# Patient Record
Sex: Female | Born: 1975 | Race: White | Hispanic: No | Marital: Married | Smoking: Current every day smoker
Health system: Southern US, Community
[De-identification: ages and names within clinical notes are randomized; demographics above are authoritative.]

## PROBLEM LIST (undated history)

## (undated) DIAGNOSIS — F419 Anxiety disorder, unspecified: Secondary | ICD-10-CM

## (undated) DIAGNOSIS — K219 Gastro-esophageal reflux disease without esophagitis: Secondary | ICD-10-CM

## (undated) DIAGNOSIS — F32A Depression, unspecified: Secondary | ICD-10-CM

## (undated) DIAGNOSIS — M47812 Spondylosis without myelopathy or radiculopathy, cervical region: Secondary | ICD-10-CM

## (undated) DIAGNOSIS — J189 Pneumonia, unspecified organism: Secondary | ICD-10-CM

## (undated) DIAGNOSIS — D259 Leiomyoma of uterus, unspecified: Secondary | ICD-10-CM

## (undated) HISTORY — PX: OVARIAN CYST REMOVAL: SHX89

## (undated) HISTORY — PX: FRACTURE SURGERY: SHX138

## (undated) HISTORY — PX: BILATERAL SALPINGECTOMY: SHX5743

## (undated) HISTORY — PX: TUBAL LIGATION: SHX77

---

## 2013-07-05 HISTORY — PX: FRACTURE SURGERY: SHX138

## 2013-07-05 HISTORY — PX: OOPHORECTOMY: SHX86

## 2015-09-17 ENCOUNTER — Emergency Department
Admission: EM | Admit: 2015-09-17 | Discharge: 2015-09-17 | Disposition: A | Discharge status: Home / Self Care | Payer: Self-pay | Attending: Emergency Medicine | Admitting: Emergency Medicine

## 2015-09-17 ENCOUNTER — Encounter: Payer: Self-pay | Admitting: Medical Oncology

## 2015-09-17 ENCOUNTER — Emergency Department: Payer: Self-pay

## 2015-09-17 DIAGNOSIS — F17200 Nicotine dependence, unspecified, uncomplicated: Secondary | ICD-10-CM | POA: Insufficient documentation

## 2015-09-17 DIAGNOSIS — M4692 Unspecified inflammatory spondylopathy, cervical region: Secondary | ICD-10-CM | POA: Insufficient documentation

## 2015-09-17 DIAGNOSIS — M62838 Other muscle spasm: Secondary | ICD-10-CM

## 2015-09-17 DIAGNOSIS — M62830 Muscle spasm of back: Secondary | ICD-10-CM | POA: Insufficient documentation

## 2015-09-17 DIAGNOSIS — M436 Torticollis: Secondary | ICD-10-CM | POA: Insufficient documentation

## 2015-09-17 DIAGNOSIS — M47812 Spondylosis without myelopathy or radiculopathy, cervical region: Secondary | ICD-10-CM

## 2015-09-17 MED ORDER — KETOROLAC TROMETHAMINE 30 MG/ML IJ SOLN
30.0000 mg | Freq: Once | INTRAMUSCULAR | Status: AC
  Administered 2015-09-17: 30 mg via INTRAMUSCULAR
  Filled 2015-09-17: qty 1

## 2015-09-17 MED ORDER — NAPROXEN 500 MG PO TABS
500.0000 mg | ORAL_TABLET | Freq: Two times a day (BID) | ORAL | Status: DC
Start: 2015-09-17 — End: 2016-01-19

## 2015-09-17 MED ORDER — CYCLOBENZAPRINE HCL 10 MG PO TABS: 10.0000 mg | ORAL_TABLET | Freq: Three times a day (TID) | ORAL | Status: DC | PRN

## 2015-09-17 NOTE — ED Provider Notes (Signed)
[facility] Emergency Department Provider Note  ____________________________________________  Time seen: Approximately 10:11 AM  I have reviewed the triage vital signs and the nursing notes.   HISTORY  Chief Complaint Torticollis    HPI Barbara Arellano is a 40 y.o. female , NAD, presents to the emergency department with 2 month history of bilateral neck pain. States she woke with the pain 2 months ago and was seen at [facility]. States that imaging or other evaluation was completed at that time. Was given medication at that time which the patienthas not helped. She has recently moved to the [place] area and presents for reevaluation. No she is uninsured and does not have a primary care provider at this time. States neck pain causes headaches on a regular basis. She denies any injury, falls, traumas. Has not had any changes in vision, chest pain, shortness of breath. No numbness, weakness, tingling. Has taken some over-the-counter medications with no alleviation of her symptoms. States she cannot fully rotate her head without significant pain. It does not radiate into the shoulders or arms.   History reviewed. No pertinent past medical history.  There are no active problems to display for this patient.   Past Surgical History  Procedure Laterality Date  . Oophorectomy      Current Outpatient Rx  Name  Route  Sig  Dispense  Refill  . cyclobenzaprine (FLEXERIL) 10 MG tablet   Oral   Take 1 tablet (10 mg total) by mouth 3 (three) times daily as needed for muscle spasms.   21 tablet   0   . naproxen (NAPROSYN) 500 MG tablet   Oral   Take 1 tablet (500 mg total) by mouth 2 (two) times daily with a meal.   14 tablet   0     Allergies Review of patient's allergies indicates no known allergies.  No family history on file.  Social History Social History  Substance Use Topics  . Smoking status: Current Every Day Smoker  . Smokeless  tobacco: None  . Alcohol Use: None     Review of Systems  Constitutional: No fever/chills Eyes: No visual changes.  ENT: No sore throat, ear pain. Cardiovascular: No chest pain. Respiratory:  No shortness of breath. No wheezing.  Gastrointestinal: No abdominal pain.  No nausea, vomiting.   Musculoskeletal: Positive neck pain and tightness. Negative for back, shoulder, arm pain. No radiation of pain. Skin: Negative for rash. Neurological: Positive for headaches, but no focal weakness or numbness. No tingling. 10-point ROS otherwise negative.  ____________________________________________   PHYSICAL EXAM:  VITAL SIGNS: ED Triage Vitals  Enc Vitals Group     BP 09/17/15 0943 133/91 mmHg     Pulse Rate 09/17/15 0943 99     Resp 09/17/15 0943 18     Temp 09/17/15 0943 98.2 F (36.8 C)     Temp Source 09/17/15 0943 Oral     SpO2 09/17/15 0943 97 %     Weight 09/17/15 0943 138 lb (62.596 kg)     Height 09/17/15 0943 5\' 4"  (1.626 m)     Head Cir --      Peak Flow --      Pain Score 09/17/15 0944 10     Pain Loc --      Pain Edu? --      Excl. in [place]? --     Constitutional: Alert and oriented. Well appearing and in no acute distress. Eyes: Conjunctivae are normal. PERRL. EOMI without  pain.  Head: Atraumatic. ENT:      Ears:       Nose: No congestion/rhinnorhea.      Mouth/Throat: Mucous membranes are moist.  Neck: Lower central cervical spine tenderness to palpation. Some paraspinal tenderness to palpation with mild muscle spasm. Neck is supple. Range of motion of cervical spine and decreased due to muscle spasm and pain. Hematological/Lymphatic/Immunilogical: No cervical lymphadenopathy. Cardiovascular: Normal rate, regular rhythm. Normal S1 and S2.  Good peripheral circulation. Respiratory: Normal respiratory effort without tachypnea or retractions. Lungs CTAB. Musculoskeletal: Neck posture is stiff. No pain to palpation about bilateral shoulders and upper extremities. No  joint effusions. Neurologic:  Normal speech and language. No gross focal neurologic deficits are appreciated.  Skin:  Skin is warm, dry and intact. No rash noted. Psychiatric: Mood and affect are normal. Speech and behavior are normal. Patient exhibits appropriate insight and judgement.   ____________________________________________   LABS  None  ____________________________________________  EKG  None ____________________________________________  RADIOLOGY I have personally viewed and evaluated these images (plain radiographs) as part of my medical decision making, as well as reviewing the written report by the radiologist.  Dg Cervical Spine Complete  09/17/2015  CLINICAL DATA:  Neck pain and limited range of motion for 2 months. No known injury. Initial encounter. EXAM: CERVICAL SPINE - COMPLETE 4+ VIEW COMPARISON:  None. FINDINGS: Vertebral body height and alignment are maintained. Mild loss of disc space height and endplate spurring are seen at C5-6 and C6-7. Lower cervical facet arthropathy is noted. Prevertebral soft tissues appear normal. Lung apices are clear. IMPRESSION: No acute abnormality. 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 Electronically Signed   By: Inge Rise M.D.   On: 09/17/2015 11:14    ____________________________________________    PROCEDURES  Procedure(s) performed: None    Medications  ketorolac (TORADOL) 30 MG/ML injection 30 mg (30 mg Intramuscular Given 09/17/15 1136)    Patient notes improvement of pain after administration of Toradol. ____________________________________________   INITIAL IMPRESSION / ASSESSMENT AND PLAN / ED COURSE  Pertinent imaging results that were available during my care of the patient were reviewed by me and considered in my medical decision making (see chart for details).  Patient's diagnosis is consistent with muscle spasms of neck and cervical arthritis. Patient will be discharged home with prescriptions for  naproxen and Flexeril to take as directed. Patient may also take Tylenol as needed for acute pain. Patient is to follow up with Dr. Bess Harvest in orthopedics for recheck in one week if no better. Also advised patient to establish care with [place] [facility] for primary care services. Patient is given ED precautions to return to the ED for any worsening or new symptoms.    ____________________________________________  FINAL CLINICAL IMPRESSION(S) / ED DIAGNOSES  Final diagnoses:  Cervical arthritis ([place])  Muscle spasms of neck      NEW MEDICATIONS STARTED DURING THIS VISIT:  Discharge Medication List as of 09/17/2015 12:07 PM    START taking these medications   Details  cyclobenzaprine (FLEXERIL) 10 MG tablet Take 1 tablet (10 mg total) by mouth 3 (three) times daily as needed for muscle spasms., Starting 09/17/2015, Until Discontinued, Print    naproxen (NAPROSYN) 500 MG tablet Take 1 tablet (500 mg total) by mouth 2 (two) times daily with a meal., Starting 09/17/2015, Until Discontinued, Boyd, PA-C 09/17/15 1246  Daymon Larsen, MD 09/17/15 1315

## 2015-09-17 NOTE — ED Notes (Signed)
Pt reports that she has been having neck pain since 07/27/15. Pt reports she was seen at [facility] recently and placed on valium which did not help. Pt denies any initial injury.

## 2015-09-17 NOTE — Discharge Instructions (Signed)
Arthritis  Arthritis means joint pain. It can also mean joint disease. A joint is a place where bones come together. People who have arthritis may have:  Red joints.  Swollen joints.  Stiff joints.  Warm joints.  A fever.    A feeling of being sick. HOME CARE  Pay attention to any changes in your symptoms. Take these actions to help with your pain and swelling.  Medicines  Take over-the-counter and prescription medicines only as told by your doctor.  Do not take aspirin for pain if your doctor says that you may have gout. Activities  Rest your joint if your doctor tells you to.  Avoid activities that make the pain worse.  Exercise your joint regularly as told by your doctor. Try doing exercises like:  Swimming.  Water aerobics.  Biking.  Walking. Joint Care  If your joint is swollen, keep it raised (elevated) if told by your doctor.  If your joint feels stiff in the morning, try taking a warm shower.  If you have diabetes, do not apply heat without asking your doctor.  If told, apply heat to the joint:  Put a towel between the joint and the hot pack or heating pad.  Leave the heat on the area for 20-30 minutes. If told, apply ice to the joint:  Put ice in a plastic bag.  Place a towel between your skin and the bag.  Leave the ice on for 20 minutes, 2-3 times per day. Keep all follow-up visits as told by your doctor. GET HELP IF:  The pain gets worse.  You have a fever. GET HELP RIGHT AWAY IF:  You have very bad pain in your joint.  You have swelling in your joint.  Your joint is red.  Many joints become painful and swollen.  You have very bad back pain.  Your leg is very weak.  You cannot control your pee (urine) or poop (stool). This information is not intended to replace advice given to you by your health care provider. Make sure you discuss any questions you have with your health care provider.  Document Released: 09/15/2009 Document Revised: 03/12/2015 Document  Reviewed: 09/16/2014  Elsevier Interactive Patient Education 2016 [place] therapy can help ease sore, stiff, injured, and tight muscles and joints. Heat relaxes your muscles, which may help ease your pain.  RISKS AND COMPLICATIONS If you have any of the following conditions, do not use heat therapy unless your health care provider has approved:  Poor circulation.  Healing wounds or scarred skin in the area being treated.  Diabetes, heart disease, or high blood pressure.  Not being able to feel (numbness) the area being treated.  Unusual swelling of the area being treated.  Active infections.  Blood clots.  Cancer.  Inability to communicate pain. This may include young children and people who have problems with their brain function (dementia).  Pregnancy. Heat therapy should only be used on old, pre-existing, or long-lasting (chronic) injuries. Do not use heat therapy on new injuries unless directed by your health care provider. HOW TO USE HEAT THERAPY There are several different kinds of heat therapy, including:  Moist heat pack.  Warm water bath.  Hot water bottle.  Electric heating pad.  Heated gel pack.  Heated wrap.  Electric heating pad. Use the heat therapy method suggested by your health care provider. Follow your health care provider's instructions on when and how to use heat therapy. GENERAL HEAT THERAPY RECOMMENDATIONS  Do not sleep while using heat therapy. Only use heat therapy while you are awake.  Your skin may turn pink while using heat therapy. Do not use heat therapy if your skin turns red.  Do not use heat therapy if you have new pain.  High heat or long exposure to heat can cause burns. Be careful when using heat therapy to avoid burning your skin.  Do not use heat therapy on areas of your skin that are already irritated, such as with a rash or sunburn. SEEK MEDICAL CARE IF:  You have blisters, redness, swelling, or  numbness.  You have new pain.  Your pain is worse. MAKE SURE YOU:  Understand these instructions.  Will watch your condition.  Will get help right away if you are not doing well or get worse.   This information is not intended to replace advice given to you by your health care provider. Make sure you discuss any questions you have with your health care provider.   Document Released: 09/13/2011 Document Revised: 07/12/2014 Document Reviewed: 08/14/2013 Elsevier Interactive Patient Education 2016 Elsevier Inc[employer]   Muscle Cramps and Spasms    Muscle cramps and spasms occur when a muscle or muscles tighten and you have no control over this tightening (involuntary muscle contraction). They are a common problem and can develop in any muscle. The most common place is in the calf muscles of the leg. Both muscle cramps and muscle spasms are involuntary muscle contractions, but they also have differences:  Muscle cramps are sporadic and painful. They may last a few seconds to a quarter of an hour. Muscle cramps are often more forceful and last longer than muscle spasms.  Muscle spasms may or may not be painful. They may also last just a few seconds or much longer. CAUSES  It is uncommon for cramps or spasms to be due to a serious underlying problem. In many cases, the cause of cramps or spasms is unknown. Some common causes are:  Overexertion.  Overuse from repetitive motions (doing the same thing over and over).  Remaining in a certain position for a long period of time.  Improper preparation, form, or technique while performing a sport or activity.  Dehydration.  Injury.  Side effects of some medicines.  Abnormally low levels of the salts and ions in your blood (electrolytes), especially potassium and calcium. This could happen if you are taking water pills (diuretics) or you are pregnant.  Some underlying medical problems can make it more likely to develop cramps or spasms. These include,  but are not limited to:  Diabetes.  Parkinson disease.  Hormone disorders, such as thyroid problems.  Alcohol abuse.  Diseases specific to muscles, joints, and bones.  Blood vessel disease where not enough blood is getting to the muscles.  HOME CARE INSTRUCTIONS  Stay well hydrated. Drink enough water and fluids to keep your urine clear or pale yellow.  It may be helpful to massage, stretch, and relax the affected muscle.  For tight or tense muscles, use a warm towel, heating pad, or hot shower water directed to the affected area.  If you are sore or have pain after a cramp or spasm, applying ice to the affected area may relieve discomfort.  Put ice in a plastic bag.  Place a towel between your skin and the bag.  Leave the ice on for 15-20 minutes, 03-04 times a day. Medicines used to treat a known cause of cramps or spasms may help reduce their  frequency or severity. Only take over-the-counter or prescription medicines as directed by your caregiver. SEEK MEDICAL CARE IF:  Your cramps or spasms get more severe, more frequent, or do not improve over time.  MAKE SURE YOU:  Understand these instructions.  Will watch your condition.  Will get help right away if you are not doing well or get worse. This information is not intended to replace advice given to you by your health care provider. Make sure you discuss any questions you have with your health care provider.  Document Released: 12/11/2001 Document Revised: 10/16/2012 Document Reviewed: 06/07/2012  Elsevier Interactive Patient Education [employer].

## 2015-11-24 ENCOUNTER — Emergency Department
Admission: EM | Admit: 2015-11-24 | Discharge: 2015-11-24 | Disposition: A | Discharge status: Home / Self Care | Payer: Self-pay | Attending: Emergency Medicine | Admitting: Emergency Medicine

## 2015-11-24 ENCOUNTER — Encounter: Payer: Self-pay | Admitting: Emergency Medicine

## 2015-11-24 ENCOUNTER — Emergency Department: Payer: Self-pay

## 2015-11-24 DIAGNOSIS — M778 Other enthesopathies, not elsewhere classified: Secondary | ICD-10-CM | POA: Insufficient documentation

## 2015-11-24 DIAGNOSIS — Y9389 Activity, other specified: Secondary | ICD-10-CM | POA: Insufficient documentation

## 2015-11-24 DIAGNOSIS — F17200 Nicotine dependence, unspecified, uncomplicated: Secondary | ICD-10-CM | POA: Insufficient documentation

## 2015-11-24 DIAGNOSIS — Y929 Unspecified place or not applicable: Secondary | ICD-10-CM | POA: Insufficient documentation

## 2015-11-24 DIAGNOSIS — Y999 Unspecified external cause status: Secondary | ICD-10-CM | POA: Insufficient documentation

## 2015-11-24 DIAGNOSIS — X509XXA Other and unspecified overexertion or strenuous movements or postures, initial encounter: Secondary | ICD-10-CM | POA: Insufficient documentation

## 2015-11-24 MED ORDER — TRAMADOL HCL 50 MG PO TABS: 50.0000 mg | ORAL_TABLET | Freq: Four times a day (QID) | ORAL | Status: DC | PRN

## 2015-11-24 MED ORDER — CYCLOBENZAPRINE HCL 10 MG PO TABS: 10.0000 mg | ORAL_TABLET | Freq: Three times a day (TID) | ORAL | Status: DC | PRN

## 2015-11-24 MED ORDER — MELOXICAM 15 MG PO TABS: 15.0000 mg | ORAL_TABLET | Freq: Every day | ORAL | Status: DC

## 2015-11-24 NOTE — ED Provider Notes (Signed)
[facility] Emergency Department Provider Note ____________________________________________  Time seen: Approximately 12:24 PM  I have reviewed the triage vital signs and the nursing notes.   HISTORY  Chief Complaint Elbow Pain    HPI Barbara Arellano is a 40 y.o. female who presents to the emergency department for evaluation of right elbow pain. She states that on Friday, her neighbor's tree fell and knocked her fence down. She states that she had to go out and try and repair it so her dogs would not get out. Denies specific injury, but states that the pain started soon after this incident. Pain worsens with full extension and is tender to touch. Also, she has a job that requires repetitive motion. She has not taken anything for pain.  History reviewed. No pertinent past medical history.  There are no active problems to display for this patient.   Past Surgical History  Procedure Laterality Date  . Oophorectomy      Current Outpatient Rx  Name  Route  Sig  Dispense  Refill  . cyclobenzaprine (FLEXERIL) 10 MG tablet   Oral   Take 1 tablet (10 mg total) by mouth 3 (three) times daily as needed for muscle spasms.   30 tablet   0   . meloxicam (MOBIC) 15 MG tablet   Oral   Take 1 tablet (15 mg total) by mouth daily.   30 tablet   2   . naproxen (NAPROSYN) 500 MG tablet   Oral   Take 1 tablet (500 mg total) by mouth 2 (two) times daily with a meal.   14 tablet   0   . traMADol (ULTRAM) 50 MG tablet   Oral   Take 1 tablet (50 mg total) by mouth every 6 (six) hours as needed.   12 tablet   0     Allergies Review of patient's allergies indicates no known allergies.  No family history on file.  Social History Social History  Substance Use Topics  . Smoking status: Current Every Day Smoker  . Smokeless tobacco: None  . Alcohol Use: None    Review of Systems Constitutional: No recent illness. Cardiovascular: Denies chest pain or  palpitations. Respiratory: Denies shortness of breath. Musculoskeletal: Pain in Right elbow Skin: Negative for rash, wound, lesion. Neurological: Negative for focal weakness or numbness.  ____________________________________________   PHYSICAL EXAM:  VITAL SIGNS: ED Triage Vitals  Enc Vitals Group     BP 11/24/15 1202 133/88 mmHg     Pulse Rate 11/24/15 1202 82     Resp 11/24/15 1202 20     Temp 11/24/15 1202 98.7 F (37.1 C)     Temp Source 11/24/15 1202 Oral     SpO2 11/24/15 1202 98 %     Weight 11/24/15 1202 146 lb (66.225 kg)     Height 11/24/15 1202 5\' 4"  (1.626 m)     Head Cir --      Peak Flow --      Pain Score 11/24/15 1205 6     Pain Loc --      Pain Edu? --      Excl. in [place]? --     Constitutional: Alert and oriented. Well appearing and in no acute distress. Eyes: Conjunctivae are normal. EOMI. Head: Atraumatic. Neck: No stridor.  Respiratory: Normal respiratory effort.   Musculoskeletal: Limited extension of the right elbow. 2+ radial pulse. Neurologic:  Normal speech and language. No gross focal neurologic deficits are appreciated. Speech is normal.  No gait instability. Skin:  Skin is warm, dry and intact. Atraumatic. Psychiatric: Mood and affect are normal. Speech and behavior are normal.  ____________________________________________   LABS (all labs ordered are listed, but only abnormal results are displayed)  Labs Reviewed - No data to display ____________________________________________  RADIOLOGY  Negative for acute bony abnormality.  I, Sherrie George, personally viewed and evaluated these images (plain radiographs) as part of my medical decision making, as well as reviewing the written report by the radiologist.  ____________________________________________   PROCEDURES  Procedure(s) performed: None   ____________________________________________   INITIAL IMPRESSION / ASSESSMENT AND PLAN / ED COURSE  Pertinent labs & imaging  results that were available during my care of the patient were reviewed by me and considered in my medical decision making (see chart for details).  Patient will be given prescriptions for meloxicam, tramadol, and Flexeril. She is to follow-up with the primary care provider of her choice for symptoms that are not improving over the next week. She was advised to return to the emergency department for symptoms that change or worsen if she unable schedule an appointment. ____________________________________________   FINAL CLINICAL IMPRESSION(S) / ED DIAGNOSES  Final diagnoses:  Right elbow tendonitis       Victorino Dike, FNP 11/24/15 1540  Eula Listen, MD 11/24/15 1556

## 2015-11-24 NOTE — ED Notes (Signed)
Developed right elbow pain for about 3 days   Unsure of injury   Pain is located in elbow only pain increases with movement  And movement is limited

## 2016-01-19 ENCOUNTER — Emergency Department
Admission: EM | Admit: 2016-01-19 | Discharge: 2016-01-19 | Disposition: A | Discharge status: Home / Self Care | Payer: Self-pay | Attending: Emergency Medicine | Admitting: Emergency Medicine

## 2016-01-19 DIAGNOSIS — K047 Periapical abscess without sinus: Secondary | ICD-10-CM | POA: Insufficient documentation

## 2016-01-19 DIAGNOSIS — L259 Unspecified contact dermatitis, unspecified cause: Secondary | ICD-10-CM | POA: Insufficient documentation

## 2016-01-19 DIAGNOSIS — F17210 Nicotine dependence, cigarettes, uncomplicated: Secondary | ICD-10-CM | POA: Insufficient documentation

## 2016-01-19 DIAGNOSIS — Z791 Long term (current) use of non-steroidal anti-inflammatories (NSAID): Secondary | ICD-10-CM | POA: Insufficient documentation

## 2016-01-19 MED ORDER — LIDOCAINE VISCOUS 2 % MT SOLN: 20.0000 mL | OROMUCOSAL | Status: DC | PRN

## 2016-01-19 MED ORDER — OXYCODONE-ACETAMINOPHEN 5-325 MG PO TABS
1.0000 | ORAL_TABLET | Freq: Once | ORAL | Status: AC
  Administered 2016-01-19: 1 via ORAL
  Filled 2016-01-19: qty 1

## 2016-01-19 MED ORDER — OXYCODONE-ACETAMINOPHEN 5-325 MG PO TABS: 1.0000 | ORAL_TABLET | ORAL | Status: DC | PRN

## 2016-01-19 MED ORDER — AMOXICILLIN 500 MG PO TABS: 500.0000 mg | ORAL_TABLET | Freq: Two times a day (BID) | ORAL | Status: DC

## 2016-01-19 MED ORDER — PREDNISONE 10 MG PO TABS: 50.0000 mg | ORAL_TABLET | Freq: Every day | ORAL | Status: DC

## 2016-01-19 MED ORDER — NAPROXEN 500 MG PO TABS: 500.0000 mg | ORAL_TABLET | Freq: Two times a day (BID) | ORAL | Status: DC

## 2016-01-19 NOTE — Discharge Instructions (Signed)
Dental Abscess A dental abscess is a collection of pus in or around a tooth. CAUSES This condition is caused by a bacterial infection around the root of the tooth that involves the inner part of the tooth (pulp). It may result from:  Severe tooth decay.  Trauma to the tooth that allows bacteria to enter into the pulp, such as a broken or chipped tooth.  Severe gum disease around a tooth. SYMPTOMS Symptoms of this condition include:  Severe pain in and around the infected tooth.  Swelling and redness around the infected tooth, in the mouth, or in the face.  Tenderness.  Pus drainage.  Bad breath.  Bitter taste in the mouth.  Difficulty swallowing.  Difficulty opening the mouth.  Nausea.  Vomiting.  Chills.  Swollen neck glands.  Fever. DIAGNOSIS This condition is diagnosed with examination of the infected tooth. During the exam, your dentist may tap on the infected tooth. Your dentist will also ask about your medical and dental history and may order X-rays. TREATMENT This condition is treated by eliminating the infection. This may be done with:  Antibiotic medicine.  A root canal. This may be performed to save the tooth.  Pulling (extracting) the tooth. This may also involve draining the abscess. This is done if the tooth cannot be saved. HOME CARE INSTRUCTIONS  Take medicines only as directed by your dentist.  If you were prescribed antibiotic medicine, finish all of it even if you start to feel better.  Rinse your mouth (gargle) often with salt water to relieve pain or swelling.  Do not drive or operate heavy machinery while taking pain medicine.  Do not apply heat to the outside of your mouth.  Keep all follow-up visits as directed by your dentist. This is important. SEEK MEDICAL CARE IF:  Your pain is worse and is not helped by medicine. SEEK IMMEDIATE MEDICAL CARE IF:  You have a fever or chills.  Your symptoms suddenly get worse.  You have a  very bad headache.  You have problems breathing or swallowing.  You have trouble opening your mouth.  You have swelling in your neck or around your eye.   This information is not intended to replace advice given to you by your health care provider. Make sure you discuss any questions you have with your health care provider.   Document Released: 06/21/2005 Document Revised: 11/05/2014 Document Reviewed: 06/18/2014 Elsevier Interactive Patient Education 2016 [place] Dermatitis Dermatitis is redness, soreness, and swelling (inflammation) of the skin. Contact dermatitis is a reaction to certain substances that touch the skin. There are two types of contact dermatitis:   Irritant contact dermatitis. This type is caused by something that irritates your skin, such as dry hands from washing them too much. This type does not require previous exposure to the substance for a reaction to occur. This type is more common.  Allergic contact dermatitis. This type is caused by a substance that you are allergic to, such as a nickel allergy or poison ivy. This type only occurs if you have been exposed to the substance (allergen) before. Upon a repeat exposure, your body reacts to the substance. This type is less common. CAUSES  Many different substances can cause contact dermatitis. Irritant contact dermatitis is most commonly caused by exposure to:   Makeup.   Soaps.   Detergents.   Bleaches.   Acids.   Metal salts, such as nickel.  Allergic contact dermatitis is most commonly caused by exposure  to:   Poisonous plants.   Chemicals.   Jewelry.   Latex.   Medicines.   Preservatives in products, such as clothing.  RISK FACTORS This condition is more likely to develop in:   People who have jobs that expose them to irritants or allergens.  People who have certain medical conditions, such as asthma or eczema.  SYMPTOMS  Symptoms of this condition may occur  anywhere on your body where the irritant has touched you or is touched by you. Symptoms include:  Dryness or flaking.   Redness.   Cracks.   Itching.   Pain or a burning feeling.   Blisters.  Drainage of small amounts of blood or clear fluid from skin cracks. With allergic contact dermatitis, there may also be swelling in areas such as the eyelids, mouth, or genitals.  DIAGNOSIS  This condition is diagnosed with a medical history and physical exam. A patch skin test may be performed to help determine the cause. If the condition is related to your job, you may need to see an occupational medicine specialist. TREATMENT Treatment for this condition includes figuring out what caused the reaction and protecting your skin from further contact. Treatment may also include:   Steroid creams or ointments. Oral steroid medicines may be needed in more severe cases.  Antibiotics or antibacterial ointments, if a skin infection is present.  Antihistamine lotion or an antihistamine taken by mouth to ease itching.  A bandage (dressing). HOME CARE INSTRUCTIONS Skin Care  Moisturize your skin as needed.   Apply cool compresses to the affected areas.  Try taking a bath with:  Epsom salts. Follow the instructions on the packaging. You can get these at your local pharmacy or grocery store.  Baking soda. Pour a small amount into the bath as directed by your health care provider.  Colloidal oatmeal. Follow the instructions on the packaging. You can get this at your local pharmacy or grocery store.  Try applying baking soda paste to your skin. Stir water into baking soda until it reaches a paste-like consistency.  Do not scratch your skin.  Bathe less frequently, such as every other day.  Bathe in lukewarm water. Avoid using hot water. Medicines  Take or apply over-the-counter and prescription medicines only as told by your health care provider.   If you were prescribed an  antibiotic medicine, take or apply your antibiotic as told by your health care provider. Do not stop using the antibiotic even if your condition starts to improve. General Instructions  Keep all follow-up visits as told by your health care provider. This is important.  Avoid the substance that caused your reaction. If you do not know what caused it, keep a journal to try to track what caused it. Write down:  What you eat.  What cosmetic products you use.  What you drink.  What you wear in the affected area. This includes jewelry.  If you were given a dressing, take care of it as told by your health care provider. This includes when to change and remove it. SEEK MEDICAL CARE IF:   Your condition does not improve with treatment.  Your condition gets worse.  You have signs of infection such as swelling, tenderness, redness, soreness, or warmth in the affected area.  You have a fever.  You have new symptoms. SEEK IMMEDIATE MEDICAL CARE IF:   You have a severe headache, neck pain, or neck stiffness.  You vomit.  You feel very sleepy.  You notice  red streaks coming from the affected area.  Your bone or joint underneath the affected area becomes painful after the skin has healed.  The affected area turns darker.  You have difficulty breathing.   This information is not intended to replace advice given to you by your health care provider. Make sure you discuss any questions you have with your health care provider.   Document Released: 06/18/2000 Document Revised: 03/12/2015 Document Reviewed: 11/06/2014 Elsevier Interactive Patient Education [employer].

## 2016-01-19 NOTE — ED Provider Notes (Signed)
[facility] Emergency Department Provider Note  ____________________________________________  Time seen: Approximately 7:28 AM  I have reviewed the triage vital signs and the nursing notes.   HISTORY  Chief Complaint Otalgia   HPI Barbara Arellano is a 40 y.o. female who presents to the ED with progressively worsening right-sided ear pain for approximately 2 weeks and a patchy localized rash on the left forearm for 1 month. Ear pain is throbbing, 10/10, and radiates to the jaw. She is having difficulty sleeping and eating on the right side due to the pain. Denies headache or ear drainage. Presenting rash is believe to have occurred after contact with a material at work. She has attempted hydrocortisone and other OTC medication for the itching without relief.      History reviewed. No pertinent past medical history.  There are no active problems to display for this patient.   Past Surgical History  Procedure Laterality Date  . Oophorectomy    . Fracture surgery      Current Outpatient Rx  Name  Route  Sig  Dispense  Refill  . amoxicillin (AMOXIL) 500 MG tablet   Oral   Take 1 tablet (500 mg total) by mouth 2 (two) times daily.   20 tablet   0   . lidocaine (XYLOCAINE) 2 % solution   Mouth/Throat   Use as directed 20 mLs in the mouth or throat as needed for mouth pain.   100 mL   0   . meloxicam (MOBIC) 15 MG tablet   Oral   Take 1 tablet (15 mg total) by mouth daily.   30 tablet   2   . naproxen (NAPROSYN) 500 MG tablet   Oral   Take 1 tablet (500 mg total) by mouth 2 (two) times daily with a meal.   60 tablet   0   . oxyCODONE-acetaminophen (ROXICET) 5-325 MG tablet   Oral   Take 1-2 tablets by mouth every 4 (four) hours as needed for severe pain.   15 tablet   0   . predniSONE (DELTASONE) 10 MG tablet   Oral   Take 5 tablets (50 mg total) by mouth daily with breakfast.   25 tablet   0     Allergies Review of patient's  allergies indicates no known allergies.  No family history on file.  Social History Social History  Substance Use Topics  . Smoking status: Current Every Day Smoker -- 0.50 packs/day    Types: Cigarettes  . Smokeless tobacco: None  . Alcohol Use: No    Review of Systems Constitutional: No fever/chills.  Eyes: No visual changes. ENT: No sore throat. Cardiovascular: Denies chest pain. Respiratory: Denies shortness of breath. Gastrointestinal: No abdominal pain.  No nausea, no vomiting.  Skin: Patchy rash on left forearm.  Neurological: Negative for headaches, focal weakness or numbness.  10-point ROS otherwise negative.  ____________________________________________   PHYSICAL EXAM:  VITAL SIGNS: ED Triage Vitals  Enc Vitals Group     BP 01/19/16 0722 119/70 mmHg     Pulse Rate 01/19/16 0722 78     Resp 01/19/16 0722 17     Temp 01/19/16 0722 97.8 F (36.6 C)     Temp Source 01/19/16 0722 Oral     SpO2 01/19/16 0722 98 %     Weight 01/19/16 0722 154 lb (69.854 kg)     Height 01/19/16 0722 5\' 4"  (1.626 m)     Head Cir --  Peak Flow --      Pain Score 01/19/16 0723 10     Pain Loc --      Pain Edu? --      Excl. in [place]? --     Constitutional: Alert and oriented. Well appearing and in no acute distress. Eyes: Conjunctivae are normal. PERRL. EOMI. Head: Atraumatic. Nose: No congestion/rhinnorhea. Mouth/Throat: Mucous membranes are moist.  Oropharynx non-erythematous. Neck: No stridor.   Cardiovascular: Normal rate, regular rhythm. Grossly normal heart sounds.  Good peripheral circulation. Respiratory: Normal respiratory effort.  No retractions. Lungs CTAB. Gastrointestinal: Soft and nontender. No distention. No abdominal bruits. No CVA tenderness. Musculoskeletal: No lower extremity tenderness nor edema.  No joint effusions. Neurologic:  Normal speech and language. No gross focal neurologic deficits are appreciated. No gait instability. Skin:  Skin is warm,  dry and intact. No rash noted. Psychiatric: Mood and affect are normal. Speech and behavior are normal.  ____________________________________________   LABS (all labs ordered are listed, but only abnormal results are displayed)  Labs Reviewed - No data to display ____________________________________________  EKG   ____________________________________________  RADIOLOGY   ____________________________________________   PROCEDURES  Procedure(s) performed: None  Critical Care performed: No  ____________________________________________   INITIAL IMPRESSION / ASSESSMENT AND PLAN / ED COURSE  Pertinent labs & imaging results that were available during my care of the patient were reviewed by me and considered in my medical decision making (see chart for details).  Dental Abscess and contact dermatitis. Prescribed amoxicillin, lidocaine 2% solution, naproxen, and Roxicet for relief of the dental abscess. Prescribed prednisone for contact dermatitis. Advised patient to seek appropriate dental care and to follow up with PCP or ED if symptoms persist or worsen.  ____________________________________________   FINAL CLINICAL IMPRESSION(S) / ED DIAGNOSES  Final diagnoses:  Abscess, dental  Contact dermatitis     This chart was dictated using voice recognition software/Dragon. Despite best efforts to proofread, errors can occur which can change the meaning. Any change was purely unintentional.   Arlyss Repress, PA-C 01/19/16 [place], MD 01/19/16 1535

## 2016-01-19 NOTE — ED Notes (Signed)
Pt c/o right ear pain for the past 2 weeks 

## 2016-01-19 NOTE — ED Notes (Signed)
See triage note  States she is having pain to right ear /jaw area for the past 2 weeks  Unsure is pain is from ear or teeth  States she has some bad teeth  No fever or drainage from ear.  Also has small area of rash to left forearm

## 2016-04-07 ENCOUNTER — Emergency Department
Admission: EM | Admit: 2016-04-07 | Discharge: 2016-04-07 | Disposition: A | Discharge status: Home / Self Care | Payer: Self-pay | Attending: Emergency Medicine | Admitting: Emergency Medicine

## 2016-04-07 ENCOUNTER — Encounter: Payer: Self-pay | Admitting: Emergency Medicine

## 2016-04-07 DIAGNOSIS — M778 Other enthesopathies, not elsewhere classified: Secondary | ICD-10-CM | POA: Insufficient documentation

## 2016-04-07 DIAGNOSIS — F17210 Nicotine dependence, cigarettes, uncomplicated: Secondary | ICD-10-CM | POA: Insufficient documentation

## 2016-04-07 MED ORDER — KETOROLAC TROMETHAMINE 30 MG/ML IJ SOLN
30.0000 mg | Freq: Once | INTRAMUSCULAR | Status: AC
Start: 2016-04-07 — End: 2016-04-07
  Administered 2016-04-07: 30 mg via INTRAMUSCULAR
  Filled 2016-04-07: qty 1

## 2016-04-07 MED ORDER — DICLOFENAC SODIUM 75 MG PO TBEC: 75.0000 mg | DELAYED_RELEASE_TABLET | Freq: Two times a day (BID) | ORAL | 0 refills | Status: DC

## 2016-04-07 NOTE — ED Triage Notes (Signed)
Reports pain in right elbow x 4 months.  States she was dx with tendonitis 4 months ago but did not f/u with ortho.  +PMS to hand

## 2016-04-07 NOTE — ED Provider Notes (Signed)
[facility] Emergency Department Provider Note  ____________________________________________   First MD Initiated Contact with Patient 04/07/16 1418     (approximate)  I have reviewed the triage vital signs and the nursing notes.   HISTORY  Chief Complaint Arm Injury   HPI Barbara Arellano is a 41 y.o. female who presents with pain in the right elbow along the lateral aspect x1 month. Patient was seen for right elbow pain on 11/24/2015 and diagnosed with tendonitis of right elbow. Patient was given meloxicam, flexeri,l and tramadol at that time and advised to follow up with a PCP if symptoms did not improve. Patient states that none of the medicine she was given initially improved the pain, but she did not follow up with a PCP or orthopedics. She reports pain eventually eased off, but started getting worse again a month ago. 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 She also reports swelling around the lateral aspect of elbow. Denies redness, fevers, or other joint pain.    History reviewed. No pertinent past medical history.  There are no active problems to display for this patient.   Past Surgical History:  Procedure Laterality Date  . FRACTURE SURGERY    . OOPHORECTOMY      Prior to Admission medications   Medication Sig Start Date End Date Taking? Authorizing Provider  diclofenac (VOLTAREN) 75 MG EC tablet Take 1 tablet (75 mg total) by mouth 2 (two) times daily. 04/07/16   Arlyss Repress, PA-C    Allergies Review of patient's allergies indicates no known allergies.  No family history on file.  Social History Social History  Substance Use Topics  . Smoking status: Current Every Day Smoker    Packs/day: 0.50    Types: Cigarettes  . Smokeless tobacco: Never Used  . Alcohol use No    Review of Systems Constitutional: No fever/chills Musculoskeletal: Positive for pain in right  elbow. Skin: Negative for rash. Neurological: Negative for focal weakness or numbness.  ____________________________________________   PHYSICAL EXAM:  VITAL SIGNS: ED Triage Vitals [04/07/16 1410]  Enc Vitals Group     BP 128/81     Pulse Rate 82     Resp 18     Temp 98.3 F (36.8 C)     Temp src      SpO2 98 %     Weight 153 lb (69.4 kg)     Height 5\' 4"  (1.626 m)     Head Circumference      Peak Flow      Pain Score 10     Pain Loc      Pain Edu?      Excl. in [place]?     Constitutional: Alert and oriented. Well appearing and in no acute distress. Eyes: Conjunctivae are normal.  Head: Atraumatic. Neck: No stridor. Supple, full ROM without pain or difficulty. Cardiovascular: Right brachial, radial, and ulnar pulses 2+. Right hand is warm and fingers acyanotic.  Respiratory: Normal respiratory effort.  No retractions.  Musculoskeletal: Right elbow is without swelling or erythema. Patient is moderately tender over lateral epicondyle on palpation. ROM is limited by pain. ROM in right wrist causes pain in right elbow. ROM in right shoulder does not cause pain in right elbow. strength 5/5 bilaterally.  Neurologic:  Normal speech and language. No gross focal neurologic deficits are appreciated.  Skin:  Skin is warm, dry and intact. No rash noted.  Psychiatric: Mood and affect are normal. Speech and behavior are normal.  ____________________________________________   LABS (all labs ordered are listed, but only abnormal results are displayed)  Labs Reviewed - No data to display ____________________________________________  EKG  None ____________________________________________  RADIOLOGY  None ____________________________________________   PROCEDURES  Procedure(s) performed: None  Procedures  Critical Care performed: No  ____________________________________________   INITIAL IMPRESSION / ASSESSMENT AND PLAN / ED COURSE  Pertinent labs & imaging results that  were available during my care of the patient were reviewed by me and considered in my medical decision making (see chart for details).  Patient presentation consistent with tendonitis of the right elbow. No further imaging warranted at this time. Patient given IM injection of toradol in the ED and tolerated well. Patient given prescription for diclofenac and instructed to follow up with orthopedic surgery. Patient also given velcro wrist splint for comfort in ED. No other emergency medicine complaints at this time.   Clinical Course     ____________________________________________   FINAL CLINICAL IMPRESSION(S) / ED DIAGNOSES  Final diagnoses:  Tendonitis of elbow, right      NEW MEDICATIONS STARTED DURING THIS VISIT:  New Prescriptions   DICLOFENAC (VOLTAREN) 75 MG EC TABLET    Take 1 tablet (75 mg total) by mouth 2 (two) times daily.     Note:  This document was prepared using Dragon voice recognition software and may include unintentional dictation errors.   Arlyss Repress, PA-C 04/07/16 1614    Lavonia Drafts, MD 04/13/16 [phone]

## 2016-04-07 NOTE — ED Notes (Signed)
Pt reports right arm pain that has increased in the last month - she states she is unable to straighten arm or lift anything - pain is throbbing and shooting pain into elbow - pt states she was here 5 months ago and dx with tendonitis and given pain medication and antiinflammatory medication

## 2016-05-10 ENCOUNTER — Encounter: Payer: Self-pay | Admitting: *Deleted

## 2016-05-10 ENCOUNTER — Emergency Department
Admission: EM | Admit: 2016-05-10 | Discharge: 2016-05-10 | Disposition: A | Discharge status: Home / Self Care | Payer: Self-pay | Attending: Emergency Medicine | Admitting: Emergency Medicine

## 2016-05-10 DIAGNOSIS — M545 Low back pain, unspecified: Secondary | ICD-10-CM

## 2016-05-10 DIAGNOSIS — F17210 Nicotine dependence, cigarettes, uncomplicated: Secondary | ICD-10-CM | POA: Insufficient documentation

## 2016-05-10 LAB — URINALYSIS COMPLETE WITH MICROSCOPIC (ARMC ONLY)
BILIRUBIN URINE: NEGATIVE
Bacteria, UA: NONE SEEN
Glucose, UA: NEGATIVE mg/dL
Hgb urine dipstick: NEGATIVE
KETONES UR: NEGATIVE mg/dL
Leukocytes, UA: NEGATIVE
Nitrite: NEGATIVE
Protein, ur: NEGATIVE mg/dL
RBC / HPF: NONE SEEN RBC/hpf (ref 0–5)
Specific Gravity, Urine: 1.019 (ref 1.005–1.030)
pH: 5 (ref 5.0–8.0)

## 2016-05-10 LAB — POCT PREGNANCY, URINE: Preg Test, Ur: NEGATIVE

## 2016-05-10 MED ORDER — OXYCODONE-ACETAMINOPHEN 5-325 MG PO TABS
1.0000 | ORAL_TABLET | Freq: Once | ORAL | Status: AC
  Administered 2016-05-10: 1 via ORAL
  Filled 2016-05-10: qty 1

## 2016-05-10 MED ORDER — OXYCODONE-ACETAMINOPHEN 5-325 MG PO TABS: 1.0000 | ORAL_TABLET | ORAL | 0 refills | Status: DC | PRN

## 2016-05-10 NOTE — ED Notes (Signed)
Woke up with pain to left flank area this am  States pain is non radiating   Denies any n/v or fever or urinary sx's

## 2016-05-10 NOTE — ED Triage Notes (Signed)
Pt complains of left lower back pain, pt denies ay other symptoms , pt reports pain started today

## 2016-05-10 NOTE — Discharge Instructions (Signed)
Follow-up with [place] or [facility] community [facility] if any continued problems with your back. Use moist heat or ice to muscles. Take Percocet as needed for inflammation and pain. Do not operate machinery or drive while taking this medication.

## 2016-05-10 NOTE — ED Provider Notes (Signed)
[facility] Emergency Department Provider Note   ____________________________________________   First MD Initiated Contact with Patient 05/10/16 [phone]     (approximate)  I have reviewed the triage vital signs and the nursing notes.   HISTORY  Chief Complaint Back Pain   HPI Barbara Arellano is a 40 y.o. female is here with complaint of left lower back pain beginning this morning. Patient is unaware of any particular injury that she has had. She denies any previous problems with her back. Patient has not taken any over-the-counter medication. She denies any urinary symptoms or history of kidney stone. She denies nausea, vomiting, diarrhea. Patient denies any paresthesias, incontinence of bowel or bladder. Currently she rates her pain as 10 over 10.   History reviewed. No pertinent past medical history.  There are no active problems to display for this patient.   Past Surgical History:  Procedure Laterality Date  . FRACTURE SURGERY    . OOPHORECTOMY      Prior to Admission medications   Medication Sig Start Date End Date Taking? Authorizing Provider  oxyCODONE-acetaminophen (PERCOCET) 5-325 MG tablet Take 1 tablet by mouth every 4 (four) hours as needed for severe pain. 05/10/16   Johnn Hai, PA-C    Allergies Patient has no known allergies.  No family history on file.  Social History Social History  Substance Use Topics  . Smoking status: Current Every Day Smoker    Packs/day: 0.50    Types: Cigarettes  . Smokeless tobacco: Never Used  . Alcohol use No    Review of Systems Constitutional: No fever/chills Cardiovascular: Denies chest pain. Respiratory: Denies shortness of breath. Gastrointestinal:  No nausea, no vomiting.  Genitourinary: Negative for dysuria. Denies history of kidney stones. Musculoskeletal: Positive for left lower back pain. Skin: Negative for rash. Neurological: Negative for headaches, focal weakness or  numbness.  10-point ROS otherwise negative.  ____________________________________________   PHYSICAL EXAM:  VITAL SIGNS: ED Triage Vitals [05/10/16 0750]  Enc Vitals Group     BP 112/72     Pulse Rate 85     Resp 20     Temp 98.5 F (36.9 C)     Temp Source Oral     SpO2 99 %     Weight 153 lb (69.4 kg)     Height 5\' 4"  (1.626 m)     Head Circumference      Peak Flow      Pain Score 10     Pain Loc      Pain Edu?      Excl. in [place]?     Constitutional: Alert and oriented. Well appearing and in no acute distress. Eyes: Conjunctivae are normal. PERRL. EOMI. Head: Atraumatic. Nose: No congestion/rhinnorhea. Neck: No stridor.   Cardiovascular: Normal rate, regular rhythm. Grossly normal heart sounds.  Good peripheral circulation. Respiratory: Normal respiratory effort.  No retractions. Lungs CTAB. Gastrointestinal: Soft and nontender. No distention.  Musculoskeletal: Examination of the back there is no gross deformity noted. There is no tenderness on palpation of vertebral processes for thoracic and lumbar spine. There is tenderness on palpation of the left paravertebral muscles. Range of motion is slightly restricted in no muscle spasms were seen. Straight leg raises bilaterally increased pain at 70%. Muscle strength bilaterally are equal. Neurologic:  Normal speech and language. No gross focal neurologic deficits are appreciated. No gait instability. Reflexes 1+ bilaterally. Skin:  Skin is warm, dry and intact. No rash noted. Psychiatric: Mood and affect  are normal. Speech and behavior are normal.  ____________________________________________   LABS (all labs ordered are listed, but only abnormal results are displayed)  Labs Reviewed  URINALYSIS COMPLETEWITH MICROSCOPIC ([place]) - Abnormal; Notable for the following:       Result Value   Color, Urine YELLOW (*)    APPearance CLEAR (*)    Squamous Epithelial / LPF 0-5 (*)    All other components within normal  limits  POC URINE PREG, ED  POCT PREGNANCY, URINE      PROCEDURES  Procedure(s) performed: None  Procedures  Critical Care performed: No  ____________________________________________   INITIAL IMPRESSION / ASSESSMENT AND PLAN / ED COURSE  Pertinent labs & imaging results that were available during my care of the patient were reviewed by me and considered in my medical decision making (see chart for details).    Clinical Course    Patient was given Percocet while in the emergency room as she did have her father driving for her. Patient is encouraged to continue with medication and prescription was given. She is to use ice or heat to her lower back. She will follow-up with her primary care doctor or [facility] clinic if any continued problems. She was reassured that she did not have a urinary tract infection.  ____________________________________________   FINAL CLINICAL IMPRESSION(S) / ED DIAGNOSES  Final diagnoses:  Acute left-sided low back pain without sciatica      NEW MEDICATIONS STARTED DURING THIS VISIT:  Discharge Medication List as of 05/10/2016  9:06 AM    START taking these medications   Details  oxyCODONE-acetaminophen (PERCOCET) 5-325 MG tablet Take 1 tablet by mouth every 4 (four) hours as needed for severe pain., Starting Mon 05/10/2016, Print         Note:  This document was prepared using Dragon voice recognition software and may include unintentional dictation errors.    Johnn Hai, PA-C 05/10/16 1129    Earleen Newport, MD 05/10/16 [phone]

## 2016-06-01 ENCOUNTER — Emergency Department: Payer: Self-pay

## 2016-06-01 ENCOUNTER — Emergency Department
Admission: EM | Admit: 2016-06-01 | Discharge: 2016-06-01 | Disposition: A | Discharge status: Home / Self Care | Payer: Self-pay | Attending: Emergency Medicine | Admitting: Emergency Medicine

## 2016-06-01 DIAGNOSIS — R079 Chest pain, unspecified: Secondary | ICD-10-CM | POA: Insufficient documentation

## 2016-06-01 DIAGNOSIS — F17210 Nicotine dependence, cigarettes, uncomplicated: Secondary | ICD-10-CM | POA: Insufficient documentation

## 2016-06-01 LAB — COMPREHENSIVE METABOLIC PANEL
ALBUMIN: 4.1 g/dL (ref 3.5–5.0)
ALT: 24 U/L (ref 14–54)
ANION GAP: 7 (ref 5–15)
AST: 33 U/L (ref 15–41)
Alkaline Phosphatase: 108 U/L (ref 38–126)
BILIRUBIN TOTAL: 0.7 mg/dL (ref 0.3–1.2)
BUN: 9 mg/dL (ref 6–20)
CHLORIDE: 105 mmol/L (ref 101–111)
CO2: 27 mmol/L (ref 22–32)
Calcium: 9.2 mg/dL (ref 8.9–10.3)
Creatinine, Ser: 0.59 mg/dL (ref 0.44–1.00)
GFR calc Af Amer: >60 mL/min (ref >60)
GLUCOSE: 88 mg/dL (ref 65–99)
POTASSIUM: 3.5 mmol/L (ref 3.5–5.1)
Sodium: 139 mmol/L (ref 135–145)
TOTAL PROTEIN: 7.8 g/dL (ref 6.5–8.1)

## 2016-06-01 LAB — CBC WITH DIFFERENTIAL/PLATELET
BASOS ABS: 0.2 10*3/uL — AB (ref 0–0.1)
BASOS PCT: 2 %
EOS PCT: 5 %
Eosinophils Absolute: 0.4 10*3/uL (ref 0–0.7)
HEMATOCRIT: 37.4 % (ref 35.0–47.0)
Hemoglobin: 13.1 g/dL (ref 12.0–16.0)
Lymphocytes Relative: 22 %
Lymphs Abs: 1.9 10*3/uL (ref 1.0–3.6)
MCH: 32.3 pg (ref 26.0–34.0)
MCHC: 35 g/dL (ref 32.0–36.0)
MCV: 92.3 fL (ref 80.0–100.0)
MONO ABS: 0.6 10*3/uL (ref 0.2–0.9)
MONOS PCT: 7 %
NEUTROS ABS: 5.5 10*3/uL (ref 1.4–6.5)
Neutrophils Relative %: 64 %
PLATELETS: 227 10*3/uL (ref 150–440)
RBC: 4.06 MIL/uL (ref 3.80–5.20)
RDW: 12.5 % (ref 11.5–14.5)
WBC: 8.6 10*3/uL (ref 3.6–11.0)

## 2016-06-01 LAB — FIBRIN DERIVATIVES D-DIMER (ARMC ONLY): Fibrin derivatives D-dimer (ARMC): 363 (ref 0–499)

## 2016-06-01 LAB — TROPONIN I

## 2016-06-01 MED ORDER — IBUPROFEN 800 MG PO TABS: 800.0000 mg | ORAL_TABLET | Freq: Three times a day (TID) | ORAL | 0 refills | Status: DC | PRN

## 2016-06-01 MED ORDER — KETOROLAC TROMETHAMINE 30 MG/ML IJ SOLN
30.0000 mg | Freq: Once | INTRAMUSCULAR | Status: AC
  Administered 2016-06-01: 30 mg via INTRAVENOUS
  Filled 2016-06-01: qty 1

## 2016-06-01 NOTE — ED Provider Notes (Signed)
[facility] Emergency Department Provider Note        Time seen: ----------------------------------------- 8:45 AM on 06/01/2016 -----------------------------------------    I have reviewed the triage vital signs and the nursing notes.   HISTORY  Chief Complaint Chest Pain    HPI Barbara Arellano is a 40 y.o. female who presents to the ER for chest tightness that started an hour prior to arrival. Patient states it radiates into her back on the left side. She reports feeling hot. She is not had a history of this, nothing makes it better or worse. She denies recent heavy lifting or physical activity. She denies recent illness.   History reviewed. No pertinent past medical history.  There are no active problems to display for this patient.   Past Surgical History:  Procedure Laterality Date  . FRACTURE SURGERY    . OOPHORECTOMY      Allergies Patient has no known allergies.  Social History Social History  Substance Use Topics  . Smoking status: Current Every Day Smoker    Packs/day: 0.50    Types: Cigarettes  . Smokeless tobacco: Never Used  . Alcohol use No    Review of Systems Constitutional: Negative for fever. Cardiovascular:Positive for chest pain Respiratory: Negative for shortness of breath. Gastrointestinal: Negative for abdominal pain, vomiting and diarrhea. Genitourinary: Negative for dysuria. Musculoskeletal: Negative for back pain. Skin: Negative for rash. Neurological: Negative for headaches, focal weakness or numbness.  10-point ROS otherwise negative.  ____________________________________________   PHYSICAL EXAM:  VITAL SIGNS: ED Triage Vitals  Enc Vitals Group     BP 06/01/16 0842 111/67     Pulse Rate 06/01/16 0842 69     Resp 06/01/16 0842 16     Temp 06/01/16 0842 98 F (36.7 C)     Temp Source 06/01/16 0842 Oral     SpO2 06/01/16 0842 99 %     Weight 06/01/16 0837 153 lb (69.4 kg)     Height 06/01/16  0837 5\' 4"  (1.626 m)     Head Circumference --      Peak Flow --      Pain Score 06/01/16 0837 8     Pain Loc --      Pain Edu? --      Excl. in [place]? --     Constitutional: Alert and oriented. Well appearing and in no distress. Eyes: Conjunctivae are normal. PERRL. Normal extraocular movements. ENT   Head: Normocephalic and atraumatic.   Nose: No congestion/rhinnorhea.   Mouth/Throat: Mucous membranes are moist.   Neck: No stridor. Cardiovascular: Normal rate, regular rhythm. No murmurs, rubs, or gallops. Respiratory: Normal respiratory effort without tachypnea nor retractions. Breath sounds are clear and equal bilaterally. No wheezes/rales/rhonchi. Gastrointestinal: Soft and nontender. Normal bowel sounds Musculoskeletal: Nontender with normal range of motion in all extremities. No lower extremity tenderness nor edema. Neurologic:  Normal speech and language. No gross focal neurologic deficits are appreciated.  Skin:  Skin is warm, dry and intact. No rash noted. Psychiatric: Mood and affect are normal. Speech and behavior are normal.  ____________________________________________  EKG: Interpreted by me. Sinus rhythm with a rate of 73 bpm, normal axis, normal intervals, no evidence of hypertrophy or acute infarction. Final interpretation normal EKG  ____________________________________________  ED COURSE:  Pertinent labs & imaging results that were available during my care of the patient were reviewed by me and considered in my medical decision making (see chart for details). Clinical Course   Patient presents to  the ER with nonspecific chest pain. We will assess with labs and imaging. She is low risk for ACS.  Procedures ____________________________________________   LABS (pertinent positives/negatives)  Labs Reviewed  CBC WITH DIFFERENTIAL/PLATELET - Abnormal; Notable for the following:       Result Value   Basophils Absolute 0.2 (*)    All other components  within normal limits  COMPREHENSIVE METABOLIC PANEL  TROPONIN I  FIBRIN DERIVATIVES D-DIMER ([facility] ONLY)    RADIOLOGY Images were viewed by me  Chest x-ray IMPRESSION: No infiltrate, congestive heart failure or pneumothorax.  Minimal peribronchial thickening may be normal versus reactive changes or changes of minimal bronchitis.  [place] thoracic spine convex right. ____________________________________________  FINAL ASSESSMENT AND PLAN  Chest pain  Plan: Patient with labs and imaging as dictated above. No clear etiology for her chest pain. She'll be discharged with anti-inflammatory medication and encouraged to have close follow-up with cardiology for reevaluation. Heart score is low risk.   Earleen Newport, MD   Note: This dictation was prepared with Dragon dictation. Any transcriptional errors that result from this process are unintentional    Earleen Newport, MD 06/01/16 1011

## 2016-06-01 NOTE — ED Triage Notes (Signed)
Pt c/o having sudden onset chest tightness that started about 1hr PTA with feeling of being hot .Marland Kitchen

## 2016-06-11 ENCOUNTER — Emergency Department
Admission: EM | Admit: 2016-06-11 | Discharge: 2016-06-11 | Disposition: A | Discharge status: Home / Self Care | Payer: Self-pay | Attending: Emergency Medicine | Admitting: Emergency Medicine

## 2016-06-11 ENCOUNTER — Emergency Department: Payer: Self-pay

## 2016-06-11 ENCOUNTER — Encounter: Payer: Self-pay | Admitting: Emergency Medicine

## 2016-06-11 DIAGNOSIS — R059 Cough, unspecified: Secondary | ICD-10-CM

## 2016-06-11 DIAGNOSIS — F17210 Nicotine dependence, cigarettes, uncomplicated: Secondary | ICD-10-CM | POA: Insufficient documentation

## 2016-06-11 DIAGNOSIS — R05 Cough: Secondary | ICD-10-CM

## 2016-06-11 DIAGNOSIS — J069 Acute upper respiratory infection, unspecified: Secondary | ICD-10-CM | POA: Insufficient documentation

## 2016-06-11 MED ORDER — BENZONATATE 100 MG PO CAPS: 200.0000 mg | ORAL_CAPSULE | Freq: Three times a day (TID) | ORAL | 0 refills | Status: AC | PRN

## 2016-06-11 MED ORDER — ACETAMINOPHEN 500 MG PO TABS
1000.0000 mg | ORAL_TABLET | Freq: Once | ORAL | Status: AC
  Administered 2016-06-11: 1000 mg via ORAL
  Filled 2016-06-11: qty 2

## 2016-06-11 MED ORDER — AZITHROMYCIN 250 MG PO TABS: ORAL_TABLET | ORAL | 0 refills | Status: DC

## 2016-06-11 NOTE — ED Provider Notes (Signed)
[facility] Emergency Department Provider Note  ____________________________________________   First MD Initiated Contact with Patient 06/11/16 [phone]     (approximate)  I have reviewed the triage vital signs and the nursing notes.   HISTORY  Chief Complaint Influenza and Fever   HPI Barbara Arellano is a 40 y.o. female is her complaint of body aches, nausea, decreased appetite for several days. Patient states that she does not have any vomiting or diarrhea but has not been eating well because "everything tastes funny". She complains also of a sore throat and a productive cough. Patient is a smoker and continues to smoke half pack cigarettes per day however the last 24 hours she is not smoked "any". She is unaware of any fever but has felt feverish and had an occasional chill. She has been taking TheraFlu without any relief. Patient states that she has history of bronchitis but no pneumonia. She rates her pain as 10 over 10 at this time.   History reviewed. No pertinent past medical history.  There are no active problems to display for this patient.   Past Surgical History:  Procedure Laterality Date  . FRACTURE SURGERY    . OOPHORECTOMY      Prior to Admission medications   Medication Sig Start Date End Date Taking? Authorizing Provider  azithromycin (ZITHROMAX Z-PAK) 250 MG tablet Take 2 tablets (500 mg) on  Day 1,  followed by 1 tablet (250 mg) once daily on Days 2 through 5. 06/11/16   Johnn Hai, PA-C  benzonatate (TESSALON PERLES) 100 MG capsule Take 2 capsules (200 mg total) by mouth 3 (three) times daily as needed. 06/11/16 06/11/17  Johnn Hai, PA-C    Allergies Patient has no known allergies.  No family history on file.  Social History Social History  Substance Use Topics  . Smoking status: Current Every Day Smoker    Packs/day: 0.50    Types: Cigarettes  . Smokeless tobacco: Never Used  . Alcohol use No    Review of  Systems Constitutional: Subjective fever/chills Eyes: No visual changes. ENT: Positive sore throat. Cardiovascular: Denies chest pain. Respiratory: Denies shortness of breath. Gastrointestinal: No abdominal pain.  No nausea, no vomiting.  No diarrhea.   Musculoskeletal: Negative for back pain. Skin: Negative for rash. Neurological: Negative for headaches, focal weakness or numbness.  10-point ROS otherwise negative.  ____________________________________________   PHYSICAL EXAM:  VITAL SIGNS: ED Triage Vitals  Enc Vitals Group     BP 06/11/16 0814 128/76     Pulse Rate 06/11/16 0814 93     Resp 06/11/16 0814 20     Temp 06/11/16 0814 98.1 F (36.7 C)     Temp Source 06/11/16 0814 Oral     SpO2 06/11/16 0814 95 %     Weight 06/11/16 0813 149 lb (67.6 kg)     Height 06/11/16 0813 5\' 4"  (1.626 m)     Head Circumference --      Peak Flow --      Pain Score 06/11/16 0814 10     Pain Loc --      Pain Edu? --      Excl. in [place]? --     Constitutional: Alert and oriented. Well appearing and in no acute distress. Eyes: Conjunctivae are normal. PERRL. EOMI. Head: Atraumatic. Nose: Mild congestion/no rhinnorhea.   TMs are dull bilaterally. Mouth/Throat: Mucous membranes are moist.  Oropharynx non-erythematous. Mild posterior drainage. Neck: No stridor.   Hematological/Lymphatic/Immunilogical:  No cervical lymphadenopathy. Cardiovascular: Normal rate, regular rhythm. Grossly normal heart sounds.  Good peripheral circulation. Respiratory: Normal respiratory effort.  No retractions. Lungs CTAB. Patient with a course nonproductive cough. Gastrointestinal: Soft and nontender. No distention.  Musculoskeletal: Moves upper and lower extremity is without difficulty and normal gait was noted. Neurologic:  Normal speech and language. No gross focal neurologic deficits are appreciated. No gait instability. Skin:  Skin is warm, dry and intact. No rash noted. Psychiatric: Mood and affect are  normal. Speech and behavior are normal.  ____________________________________________   LABS (all labs ordered are listed, but only abnormal results are displayed)  Labs Reviewed - No data to display   RADIOLOGY  Chest x-ray per radiologist is negative. ____________________________________________   PROCEDURES  Procedure(s) performed: None  Procedures  Critical Care performed: No  ____________________________________________   INITIAL IMPRESSION / ASSESSMENT AND PLAN / ED COURSE  Pertinent labs & imaging results that were available during my care of the patient were reviewed by me and considered in my medical decision making (see chart for details).    Clinical Course    Patient was placed on Zithromax Pak as directed and [employer] as needed for cough. She is encouraged to increase fluids and follow-up with her primary care doctor or medical clinic if any continued problems.  ____________________________________________   FINAL CLINICAL IMPRESSION(S) / ED DIAGNOSES  Final diagnoses:  Acute upper respiratory infection  Cough      NEW MEDICATIONS STARTED DURING THIS VISIT:  Discharge Medication List as of 06/11/2016 10:34 AM    START taking these medications   Details  azithromycin (ZITHROMAX Z-PAK) 250 MG tablet Take 2 tablets (500 mg) on  Day 1,  followed by 1 tablet (250 mg) once daily on Days 2 through 5., Print    benzonatate (TESSALON PERLES) 100 MG capsule Take 2 capsules (200 mg total) by mouth 3 (three) times daily as needed., Starting Fri 06/11/2016, Until Sat 06/11/2017, Print         Note:  This document was prepared using Dragon voice recognition software and may include unintentional dictation errors.    Johnn Hai, PA-C 06/11/16 1518    Delman Kitten, MD 06/12/16 [phone]

## 2016-06-11 NOTE — Discharge Instructions (Signed)
Increase fluids. Begin taking Zithromax as directed for the next 5 days. Tessalon Perles 2 every 8 hours as needed for cough. You may still take Tylenol or ibuprofen for body aches, headache or fever. Decrease smoking. Follow-up with [facility] clinic if any continued problems.

## 2016-06-11 NOTE — ED Triage Notes (Signed)
Says fever, aching all over.  Says no vomiting, but doesnot feel like eating.

## 2016-06-11 NOTE — ED Notes (Signed)
See triage note  States she developed body aches with some nausea and decreased appetite for couple of days  States she is not vomiting but everything she eats or drinks has a funny taste  Sore throat

## 2016-07-25 ENCOUNTER — Emergency Department
Admission: EM | Admit: 2016-07-25 | Discharge: 2016-07-25 | Disposition: A | Discharge status: Home / Self Care | Payer: Self-pay | Attending: Emergency Medicine | Admitting: Emergency Medicine

## 2016-07-25 DIAGNOSIS — F17210 Nicotine dependence, cigarettes, uncomplicated: Secondary | ICD-10-CM | POA: Insufficient documentation

## 2016-07-25 DIAGNOSIS — J069 Acute upper respiratory infection, unspecified: Secondary | ICD-10-CM | POA: Insufficient documentation

## 2016-07-25 LAB — COMPREHENSIVE METABOLIC PANEL
ALBUMIN: 4.4 g/dL (ref 3.5–5.0)
ALT: 25 U/L (ref 14–54)
AST: 34 U/L (ref 15–41)
Alkaline Phosphatase: 105 U/L (ref 38–126)
Anion gap: 9 (ref 5–15)
BUN: 9 mg/dL (ref 6–20)
CO2: 26 mmol/L (ref 22–32)
CREATININE: 0.65 mg/dL (ref 0.44–1.00)
Calcium: 9.5 mg/dL (ref 8.9–10.3)
Chloride: 103 mmol/L (ref 101–111)
GFR calc Af Amer: >60 mL/min (ref >60)
GLUCOSE: 104 mg/dL — AB (ref 65–99)
Potassium: 3.6 mmol/L (ref 3.5–5.1)
Sodium: 138 mmol/L (ref 135–145)
Total Bilirubin: 0.5 mg/dL (ref 0.3–1.2)
Total Protein: 8.1 g/dL (ref 6.5–8.1)

## 2016-07-25 LAB — CBC
HEMATOCRIT: 35.3 % (ref 35.0–47.0)
Hemoglobin: 12.6 g/dL (ref 12.0–16.0)
MCH: 32.1 pg (ref 26.0–34.0)
MCHC: 35.5 g/dL (ref 32.0–36.0)
MCV: 90.5 fL (ref 80.0–100.0)
PLATELETS: 252 10*3/uL (ref 150–440)
RBC: 3.91 MIL/uL (ref 3.80–5.20)
RDW: 13.2 % (ref 11.5–14.5)
WBC: 7.9 10*3/uL (ref 3.6–11.0)

## 2016-07-25 LAB — INFLUENZA PANEL BY PCR (TYPE A & B)
INFLBPCR: NEGATIVE
Influenza A By PCR: NEGATIVE

## 2016-07-25 MED ORDER — GUAIFENESIN-CODEINE 100-10 MG/5ML PO SOLN: 5.0000 mL | Freq: Four times a day (QID) | ORAL | 0 refills | Status: DC | PRN

## 2016-07-25 MED ORDER — ONDANSETRON HCL 4 MG/2ML IJ SOLN
INTRAMUSCULAR | Status: AC
  Administered 2016-07-25: 4 mg via INTRAVENOUS
  Filled 2016-07-25: qty 2

## 2016-07-25 MED ORDER — ONDANSETRON HCL 4 MG/2ML IJ SOLN
4.0000 mg | Freq: Once | INTRAMUSCULAR | Status: AC
  Administered 2016-07-25: 4 mg via INTRAVENOUS

## 2016-07-25 MED ORDER — PROMETHAZINE HCL 25 MG PO TABS: 25.0000 mg | ORAL_TABLET | Freq: Four times a day (QID) | ORAL | 0 refills | Status: DC | PRN

## 2016-07-25 MED ORDER — SODIUM CHLORIDE 0.9 % IV BOLUS (SEPSIS)
1000.0000 mL | Freq: Once | INTRAVENOUS | Status: AC
  Administered 2016-07-25: 1000 mL via INTRAVENOUS

## 2016-07-25 MED ORDER — HYDROCODONE-ACETAMINOPHEN 5-325 MG PO TABS
2.0000 | ORAL_TABLET | Freq: Once | ORAL | Status: AC
  Administered 2016-07-25: 2 via ORAL
  Filled 2016-07-25: qty 2

## 2016-07-25 NOTE — ED Triage Notes (Signed)
Pt arrives to ER via POV c/o fatigue, body aches, chills, fever that began last night. Nonproductive cough. Pt alert and oriented X4, active, cooperative, pt in NAD. RR even and unlabored, color WNL.

## 2016-07-25 NOTE — ED Provider Notes (Signed)
[facility] Emergency Department Provider Note  Time seen: 5:36 PM  I have reviewed the triage vital signs and the nursing notes.   HISTORY  Chief Complaint Generalized Body Aches; Chills; and Fatigue    HPI Barbara Arellano is a 41 y.o. female who presents to the emergency department for 2 days of fever, generalized weakness, body aches, nausea and vomiting. According to the patient for the past 2 days she has had a subjective fever, been very nauseated and hasn't been able to keep anything down for 2 days due to vomiting. She states diffuse body aches mild headache and cough. Patient states she has had influenza as well as pneumonia in the past and she is concerned she might have influenza or pneumonia currently. Patient also states a history of significant hypokalemia in the past. Denies any chest pain. Denies abdominal pain.  History reviewed. No pertinent past medical history.  There are no active problems to display for this patient.   Past Surgical History:  Procedure Laterality Date  . FRACTURE SURGERY    . OOPHORECTOMY      Prior to Admission medications   Medication Sig Start Date End Date Taking? Authorizing Provider  azithromycin (ZITHROMAX Z-PAK) 250 MG tablet Take 2 tablets (500 mg) on  Day 1,  followed by 1 tablet (250 mg) once daily on Days 2 through 5. 06/11/16   Johnn Hai, PA-C  benzonatate (TESSALON PERLES) 100 MG capsule Take 2 capsules (200 mg total) by mouth 3 (three) times daily as needed. 06/11/16 06/11/17  Johnn Hai, PA-C    No Known Allergies  No family history on file.  Social History Social History  Substance Use Topics  . Smoking status: Current Every Day Smoker    Packs/day: 0.50    Types: Cigarettes  . Smokeless tobacco: Never Used  . Alcohol use No    Review of Systems Constitutional: Positive for subjective fever. Positive for nasal congestion. Cardiovascular: Negative for chest pain. Respiratory:  Negative for shortness of breath. Positive for cough. Gastrointestinal: Negative for abdominal pain positive for vomiting. Genitourinary: Negative for dysuria 10-point ROS otherwise negative.  ____________________________________________   PHYSICAL EXAM:  VITAL SIGNS: ED Triage Vitals [07/25/16 1659]  Enc Vitals Group     BP 116/77     Pulse Rate 95     Resp 20     Temp 99.8 F (37.7 C)     Temp Source Oral     SpO2 98 %     Weight 156 lb (70.8 kg)     Height 5\' 4"  (1.626 m)     Head Circumference      Peak Flow      Pain Score 10     Pain Loc      Pain Edu?      Excl. in [place]?     Constitutional: Alert and oriented. Well appearing and in no distress. Eyes: Normal exam ENT   Head: Normocephalic and atraumatic.   Mouth/Throat: Mucous membranes are moist. Cardiovascular: Normal rate, regular rhythm. No murmur Respiratory: Normal respiratory effort without tachypnea nor retractions. Breath sounds are clear  Gastrointestinal: Soft and nontender. No distention. Musculoskeletal: Nontender with normal range of motion in all extremities.  Neurologic:  Normal speech and language. No gross focal neurologic deficits  Skin:  Skin is warm, dry and intact.  Psychiatric: Mood and affect are normal.   ____________________________________________   INITIAL IMPRESSION / ASSESSMENT AND PLAN / ED COURSE  Pertinent labs &  imaging results that were available during my care of the patient were reviewed by me and considered in my medical decision making (see chart for details).  The patient presents the emergency department with generalized weakness subjective fevers, cough or congestion nausea and vomiting. We will check labs, IV hydrate, and obtain an influenza swab and closely monitor in the emergency department. Symptoms are very suggestive of viral process.  Workup is negative. Influenza negative. Likely viral syndrome. We'll discharge with cough medication and Phenergan for  nausea.  ____________________________________________   FINAL CLINICAL IMPRESSION(S) / ED DIAGNOSES  Viral syndrome    Harvest Dark, MD 07/25/16 1932

## 2016-07-25 NOTE — ED Notes (Signed)
Pt c/o flu-like sxs for the past day. Vomiting and diarrhea multiple times today. Pt vomited in the bathroom after being roomed. Clear yellow like. Pt states when she vomits she also has diarrhea at the same time.

## 2016-12-27 ENCOUNTER — Emergency Department: Payer: Self-pay

## 2016-12-27 ENCOUNTER — Encounter: Payer: Self-pay | Admitting: Emergency Medicine

## 2016-12-27 ENCOUNTER — Emergency Department
Admission: EM | Admit: 2016-12-27 | Discharge: 2016-12-27 | Disposition: A | Discharge status: Home / Self Care | Payer: Self-pay | Attending: Emergency Medicine | Admitting: Emergency Medicine

## 2016-12-27 DIAGNOSIS — M4722 Other spondylosis with radiculopathy, cervical region: Secondary | ICD-10-CM

## 2016-12-27 DIAGNOSIS — M5030 Other cervical disc degeneration, unspecified cervical region: Secondary | ICD-10-CM | POA: Insufficient documentation

## 2016-12-27 DIAGNOSIS — F17210 Nicotine dependence, cigarettes, uncomplicated: Secondary | ICD-10-CM | POA: Insufficient documentation

## 2016-12-27 HISTORY — DX: Spondylosis without myelopathy or radiculopathy, cervical region: M47.812

## 2016-12-27 MED ORDER — METHYLPREDNISOLONE 4 MG PO TBPK: ORAL_TABLET | ORAL | 0 refills | Status: DC

## 2016-12-27 MED ORDER — TRAMADOL HCL 50 MG PO TABS: 50.0000 mg | ORAL_TABLET | Freq: Four times a day (QID) | ORAL | 0 refills | Status: DC | PRN

## 2016-12-27 MED ORDER — CYCLOBENZAPRINE HCL 10 MG PO TABS: 10.0000 mg | ORAL_TABLET | Freq: Three times a day (TID) | ORAL | 0 refills | Status: DC | PRN

## 2016-12-27 MED ORDER — KETOROLAC TROMETHAMINE 60 MG/2ML IM SOLN
60.0000 mg | Freq: Once | INTRAMUSCULAR | Status: AC
  Administered 2016-12-27: 60 mg via INTRAMUSCULAR
  Filled 2016-12-27: qty 2

## 2016-12-27 MED ORDER — ORPHENADRINE CITRATE 30 MG/ML IJ SOLN
60.0000 mg | Freq: Two times a day (BID) | INTRAMUSCULAR | Status: DC
  Administered 2016-12-27: 60 mg via INTRAMUSCULAR
  Filled 2016-12-27: qty 2

## 2016-12-27 NOTE — ED Triage Notes (Signed)
States pain neck, upper back and shoulders x 1 month. 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

## 2016-12-27 NOTE — ED Notes (Signed)
See triage note  States she developed pain to neck  Which is radiating into left shoulder about 1 month ago. States she is now having some numbness into left 2 fingers

## 2016-12-27 NOTE — ED Provider Notes (Signed)
[facility] Emergency Department Provider Note   ____________________________________________   First MD Initiated Contact with Patient 12/27/16 [phone]     (approximate)  I have reviewed the triage vital signs and the nursing notes.   HISTORY  Chief Complaint Back Pain    HPI Barbara Arellano is a 41 y.o. female patient complaining of radicular pain to the left upper extremity. Patient has cervical arthritis. 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 Patient state pain has increased in the last month. Patient rates pain as a 10 over 10. Patient is right-hand dominant. No palliative measures for complaint.  Past Medical History:  Diagnosis Date  . Cervical arthritis ([place])     There are no active problems to display for this patient.   Past Surgical History:  Procedure Laterality Date  . FRACTURE SURGERY    . OOPHORECTOMY      Prior to Admission medications   Medication Sig Start Date End Date Taking? Authorizing Provider  azithromycin (ZITHROMAX Z-PAK) 250 MG tablet Take 2 tablets (500 mg) on  Day 1,  followed by 1 tablet (250 mg) once daily on Days 2 through 5. 06/11/16   Johnn Hai, PA-C  benzonatate (TESSALON PERLES) 100 MG capsule Take 2 capsules (200 mg total) by mouth 3 (three) times daily as needed. 06/11/16 06/11/17  Johnn Hai, PA-C  cyclobenzaprine (FLEXERIL) 10 MG tablet Take 1 tablet (10 mg total) by mouth 3 (three) times daily as needed. 12/27/16   Sable Feil, PA-C  guaiFENesin-codeine 100-10 MG/5ML syrup Take 5 mLs by mouth every 6 (six) hours as needed for cough. 07/25/16   Harvest Dark, MD  methylPREDNISolone (MEDROL DOSEPAK) 4 MG TBPK tablet Take Tapered dose as directed 12/27/16   Sable Feil, PA-C  promethazine (PHENERGAN) 25 MG tablet Take 1 tablet (25 mg total) by mouth every 6 (six) hours as needed for nausea or vomiting. 07/25/16   Harvest Dark, MD  traMADol  (ULTRAM) 50 MG tablet Take 1 tablet (50 mg total) by mouth every 6 (six) hours as needed for moderate pain. 12/27/16   Sable Feil, PA-C    Allergies Patient has no known allergies.  No family history on file.  Social History Social History  Substance Use Topics  . Smoking status: Current Every Day Smoker    Packs/day: 0.50    Types: Cigarettes  . Smokeless tobacco: Never Used  . Alcohol use No    Review of Systems  Constitutional: No fever/chills Eyes: No visual changes. ENT: No sore throat. Cardiovascular: Denies chest pain. Respiratory: Denies shortness of breath. Gastrointestinal: No abdominal pain.  No nausea, no vomiting.  No diarrhea.  No constipation. Genitourinary: Negative for dysuria. Musculoskeletal: Neck and left upper extremity pain Skin: Negative for rash. Neurological: Negative for headaches, focal weakness or numbness.   ____________________________________________   PHYSICAL EXAM:  VITAL SIGNS: ED Triage Vitals  Enc Vitals Group     BP 12/27/16 0859 129/77     Pulse Rate 12/27/16 0859 73     Resp 12/27/16 0859 18     Temp 12/27/16 0859 98.4 F (36.9 C)     Temp Source 12/27/16 0859 Oral     SpO2 12/27/16 0859 100 %     Weight 12/27/16 0859 163 lb (73.9 kg)     Height 12/27/16 0859 5\' 4"  (1.626 m)     Head Circumference --      Peak Flow --  Pain Score 12/27/16 0905 10     Pain Loc --      Pain Edu? --      Excl. in [place]? --     Constitutional: Alert and oriented. Well appearing and in no acute distress. Eyes: Conjunctivae are normal. PERRL. EOMI. Head: Atraumatic. Nose: No congestion/rhinnorhea. Mouth/Throat: Mucous membranes are moist.  Oropharynx non-erythematous. Neck: No stridor.   cervical spine tenderness to palpation C5  and C6 Hematological/Lymphatic/Immunilogical: No cervical lymphadenopathy. Cardiovascular: Normal rate, regular rhythm. Grossly normal heart sounds.  Good peripheral circulation. Respiratory: Normal  respiratory effort.  No retractions. Lungs CTAB. Gastrointestinal: Soft and nontender. No distention. No abdominal bruits. No CVA tenderness. Musculoskeletal: No lower extremity tenderness nor edema.  No joint effusions. Neurologic:  Normal speech and language. No gross focal neurologic deficits are appreciated. No gait instability. Skin:  Skin is warm, dry and intact. No rash noted. Psychiatric: Mood and affect are normal. Speech and behavior are normal.  ____________________________________________   LABS (all labs ordered are listed, but only abnormal results are displayed)  Labs Reviewed - No data to display ____________________________________________  EKG   ____________________________________________  RADIOLOGY  Dg Cervical Spine Complete  Result Date: 12/27/2016 CLINICAL DATA:  Left-sided neck pain for 1 month, initial encounter EXAM: CERVICAL SPINE - COMPLETE 4+ VIEW COMPARISON:  09/17/2015 FINDINGS: Seven cervical segments are well visualized. Vertebral body height is well maintained. Osteophytic changes with disc space narrowing are noted at C5-6 and C6-7. These have progressed slightly in the interval from the prior exam. No neural foraminal narrowing is seen. No soft tissue abnormality is noted. The odontoid is within normal limits. IMPRESSION: Degenerative change with slight increase when compare with the prior exam. Electronically Signed   By: Inez Catalina M.D.   On: 12/27/2016 10:16    _Degenerative changes found on C5-C7. ___________________________________________   PROCEDURES  Procedure(s) performed: None  Procedures  Critical Care performed: No  ____________________________________________   INITIAL IMPRESSION / ASSESSMENT AND PLAN / ED COURSE  Pertinent labs & imaging results that were available during my care of the patient were reviewed by me and considered in my medical decision making (see chart for details).  Cervical radiculopathy secondary to  degenerative changes in the cervical spine. Discussed x-ray finding with patient. Patient given discharge care instructions. [facility]      ____________________________________________   FINAL CLINICAL IMPRESSION(S) / ED DIAGNOSES  Final diagnoses:  Cervical radiculopathy due to degenerative joint disease of spine      NEW MEDICATIONS STARTED DURING THIS VISIT:  New Prescriptions   CYCLOBENZAPRINE (FLEXERIL) 10 MG TABLET    Take 1 tablet (10 mg total) by mouth 3 (three) times daily as needed.   METHYLPREDNISOLONE (MEDROL DOSEPAK) 4 MG TBPK TABLET    Take Tapered dose as directed   TRAMADOL (ULTRAM) 50 MG TABLET    Take 1 tablet (50 mg total) by mouth every 6 (six) hours as needed for moderate pain.     Note:  This document was prepared using Dragon voice recognition software and may include unintentional dictation errors.    Sable Feil, PA-C 12/27/16 1041    Delman Kitten, MD 01/01/17 1350

## 2017-01-17 ENCOUNTER — Emergency Department
Admission: EM | Admit: 2017-01-17 | Discharge: 2017-01-17 | Disposition: A | Discharge status: Home / Self Care | Payer: Self-pay | Attending: Emergency Medicine | Admitting: Emergency Medicine

## 2017-01-17 ENCOUNTER — Encounter: Payer: Self-pay | Admitting: Emergency Medicine

## 2017-01-17 DIAGNOSIS — F17210 Nicotine dependence, cigarettes, uncomplicated: Secondary | ICD-10-CM | POA: Insufficient documentation

## 2017-01-17 DIAGNOSIS — K121 Other forms of stomatitis: Secondary | ICD-10-CM | POA: Insufficient documentation

## 2017-01-17 MED ORDER — LIDOCAINE VISCOUS 2 % MT SOLN
15.0000 mL | Freq: Once | OROMUCOSAL | Status: AC
Start: 2017-01-17 — End: 2017-01-17
  Administered 2017-01-17: 15 mL via OROMUCOSAL
  Filled 2017-01-17: qty 15

## 2017-01-17 MED ORDER — MAGIC MOUTHWASH W/LIDOCAINE: 5.0000 mL | Freq: Four times a day (QID) | ORAL | 0 refills | Status: DC

## 2017-01-17 MED ORDER — IBUPROFEN 600 MG PO TABS: 600.0000 mg | ORAL_TABLET | Freq: Three times a day (TID) | ORAL | 0 refills | Status: DC | PRN

## 2017-01-17 MED ORDER — DIPHENHYDRAMINE HCL 12.5 MG/5ML PO ELIX
25.0000 mg | ORAL_SOLUTION | Freq: Once | ORAL | Status: AC
  Administered 2017-01-17: 25 mg via ORAL
  Filled 2017-01-17: qty 10

## 2017-01-17 NOTE — ED Triage Notes (Signed)
Presents with swelling to gumline on Saturday  This am swelling feels like it is worse and spreading under tongue

## 2017-01-17 NOTE — ED Notes (Signed)
Pt taking medication and giggling lidocaine. Pt reports feeling a "tingling" in mouth midway through lidocaine dose. Pt in NAD at this time and verbalized understanding of medications and follow up care with dentist. Pt reports they were closed today but states she has intent to follow up as soon as possible.

## 2017-01-17 NOTE — ED Provider Notes (Signed)
[facility] Emergency Department Provider Note   ____________________________________________   First MD Initiated Contact with Patient 01/17/17 1419     (approximate)  I have reviewed the triage vital signs and the nursing notes.   HISTORY  Chief Complaint Oral Pain    HPI Barbara Arellano is a 41 y.o. female patient complaining of swelling gums and mouth pain for 2 days. Patient state she thinks the swelling is going under the tongue. Patient denies any fever associated this complaint. Patient denies any other dental issues at this time. Patient described a pain as "burning". No palliative measures for complaint. Rates pain as a 5/10.   Past Medical History:  Diagnosis Date  . Cervical arthritis ([place])     There are no active problems to display for this patient.   Past Surgical History:  Procedure Laterality Date  . FRACTURE SURGERY    . OOPHORECTOMY      Prior to Admission medications   Medication Sig Start Date End Date Taking? Authorizing Provider  azithromycin (ZITHROMAX Z-PAK) 250 MG tablet Take 2 tablets (500 mg) on  Day 1,  followed by 1 tablet (250 mg) once daily on Days 2 through 5. 06/11/16   Johnn Hai, PA-C  benzonatate (TESSALON PERLES) 100 MG capsule Take 2 capsules (200 mg total) by mouth 3 (three) times daily as needed. 06/11/16 06/11/17  Johnn Hai, PA-C  cyclobenzaprine (FLEXERIL) 10 MG tablet Take 1 tablet (10 mg total) by mouth 3 (three) times daily as needed. 12/27/16   Sable Feil, PA-C  guaiFENesin-codeine 100-10 MG/5ML syrup Take 5 mLs by mouth every 6 (six) hours as needed for cough. 07/25/16   Harvest Dark, MD  ibuprofen (ADVIL,MOTRIN) 600 MG tablet Take 1 tablet (600 mg total) by mouth every 8 (eight) hours as needed. 01/17/17   Sable Feil, PA-C  magic mouthwash w/lidocaine SOLN Take 5 mLs by mouth 4 (four) times daily. Oral swish and swallow 01/17/17   Sable Feil, PA-C  methylPREDNISolone  (MEDROL DOSEPAK) 4 MG TBPK tablet Take Tapered dose as directed 12/27/16   Sable Feil, PA-C  promethazine (PHENERGAN) 25 MG tablet Take 1 tablet (25 mg total) by mouth every 6 (six) hours as needed for nausea or vomiting. 07/25/16   Harvest Dark, MD  traMADol (ULTRAM) 50 MG tablet Take 1 tablet (50 mg total) by mouth every 6 (six) hours as needed for moderate pain. 12/27/16   Sable Feil, PA-C    Allergies Patient has no known allergies.  No family history on file.  Social History Social History  Substance Use Topics  . Smoking status: Current Every Day Smoker    Packs/day: 0.50    Types: Cigarettes  . Smokeless tobacco: Never Used  . Alcohol use No    Review of Systems  Constitutional: No fever/chills Eyes: No visual changes. ENT: No sore throat. Mouth and gum pain Cardiovascular: Denies chest pain. Respiratory: Denies shortness of breath. Gastrointestinal: No abdominal pain.  No nausea, no vomiting.  No diarrhea.  No constipation. Genitourinary: Negative for dysuria. Musculoskeletal: Negative for back pain. Skin: Negative for rash. Neurological: Negative for headaches, focal weakness or numbness.   ____________________________________________   PHYSICAL EXAM:  VITAL SIGNS: ED Triage Vitals [01/17/17 1415]  Enc Vitals Group     BP 110/65     Pulse Rate 76     Resp 20     Temp 98.7 F (37.1 C)     Temp Source  Oral     SpO2 99 %     Weight      Height      Head Circumference      Peak Flow      Pain Score      Pain Loc      Pain Edu?      Excl. in [place]?     Constitutional: Alert and oriented. Well appearing and in no acute distress. Mouth/Throat: Mucous membranes are moist.  Oropharynx non-erythematous.Oval  lesions on tongue and gingiva. Neck: No stridor.  No cervical spine tenderness to palpation. Hematological/Lymphatic/Immunilogical: No cervical lymphadenopathy. Cardiovascular: Normal rate, regular rhythm. Grossly normal heart sounds.  Good  peripheral circulation. Respiratory: Normal respiratory effort.  No retractions. Lungs CTAB. Gastrointestinal: Soft and nontender. No distention. No abdominal bruits. No CVA tenderness. Musculoskeletal: No lower extremity tenderness nor edema.  No joint effusions. Neurologic:  Normal speech and language. No gross focal neurologic deficits are appreciated. No gait instability. Skin:  Skin is warm, dry and intact. No rash noted. Psychiatric: Mood and affect are normal. Speech and behavior are normal.  ____________________________________________   LABS (all labs ordered are listed, but only abnormal results are displayed)  Labs Reviewed - No data to display ____________________________________________  EKG   ____________________________________________  RADIOLOGY  No results found.  ____________________________________________   PROCEDURES  Procedure(s) performed: None  Procedures  Critical Care performed: No  ____________________________________________   INITIAL IMPRESSION / ASSESSMENT AND PLAN / ED COURSE  Pertinent labs & imaging results that were available during my care of the patient were reviewed by me and considered in my medical decision making (see chart for details).  Stomatitis. Patient given discharge care instructions. Patient given a work note for today. [facility].      ____________________________________________   FINAL CLINICAL IMPRESSION(S) / ED DIAGNOSES  Final diagnoses:  Stomatitis      NEW MEDICATIONS STARTED DURING THIS VISIT:  New Prescriptions   IBUPROFEN (ADVIL,MOTRIN) 600 MG TABLET    Take 1 tablet (600 mg total) by mouth every 8 (eight) hours as needed.   MAGIC MOUTHWASH W/LIDOCAINE SOLN    Take 5 mLs by mouth 4 (four) times daily. Oral swish and swallow     Note:  This document was prepared using Dragon voice recognition software and may include  unintentional dictation errors.    Sable Feil, PA-C 01/17/17 [place], [place], MD 01/18/17 [phone]

## 2017-03-23 ENCOUNTER — Emergency Department: Payer: Self-pay

## 2017-03-23 ENCOUNTER — Emergency Department
Admission: EM | Admit: 2017-03-23 | Discharge: 2017-03-23 | Disposition: A | Discharge status: Home / Self Care | Payer: Self-pay | Attending: Emergency Medicine | Admitting: Emergency Medicine

## 2017-03-23 DIAGNOSIS — F17210 Nicotine dependence, cigarettes, uncomplicated: Secondary | ICD-10-CM | POA: Insufficient documentation

## 2017-03-23 DIAGNOSIS — R0789 Other chest pain: Secondary | ICD-10-CM | POA: Insufficient documentation

## 2017-03-23 LAB — CBC WITH DIFFERENTIAL/PLATELET
BASOS ABS: 0.1 10*3/uL (ref 0–0.1)
Basophils Relative: 1 %
EOS ABS: 0.4 10*3/uL (ref 0–0.7)
EOS PCT: 3 %
HCT: 35 % (ref 35.0–47.0)
Hemoglobin: 12.2 g/dL (ref 12.0–16.0)
LYMPHS PCT: 19 %
Lymphs Abs: 2.4 10*3/uL (ref 1.0–3.6)
MCH: 31.3 pg (ref 26.0–34.0)
MCHC: 34.9 g/dL (ref 32.0–36.0)
MCV: 89.6 fL (ref 80.0–100.0)
Monocytes Absolute: 0.7 10*3/uL (ref 0.2–0.9)
Monocytes Relative: 6 %
Neutro Abs: 8.9 10*3/uL — ABNORMAL HIGH (ref 1.4–6.5)
Neutrophils Relative %: 71 %
PLATELETS: 236 10*3/uL (ref 150–440)
RBC: 3.9 MIL/uL (ref 3.80–5.20)
RDW: 14 % (ref 11.5–14.5)
WBC: 12.6 10*3/uL — AB (ref 3.6–11.0)

## 2017-03-23 LAB — COMPREHENSIVE METABOLIC PANEL
ALK PHOS: 77 U/L (ref 38–126)
ALT: 13 U/L — AB (ref 14–54)
ANION GAP: 8 (ref 5–15)
AST: 24 U/L (ref 15–41)
Albumin: 4.1 g/dL (ref 3.5–5.0)
BUN: 12 mg/dL (ref 6–20)
CO2: 26 mmol/L (ref 22–32)
CREATININE: 0.56 mg/dL (ref 0.44–1.00)
Calcium: 9.2 mg/dL (ref 8.9–10.3)
Chloride: 104 mmol/L (ref 101–111)
Glucose, Bld: 91 mg/dL (ref 65–99)
Potassium: 3.8 mmol/L (ref 3.5–5.1)
Sodium: 138 mmol/L (ref 135–145)
Total Bilirubin: 0.6 mg/dL (ref 0.3–1.2)
Total Protein: 7.5 g/dL (ref 6.5–8.1)

## 2017-03-23 LAB — FIBRIN DERIVATIVES D-DIMER (ARMC ONLY): FIBRIN DERIVATIVES D-DIMER (ARMC): 198.57 (ref 0.00–499.00)

## 2017-03-23 LAB — LIPASE, BLOOD: LIPASE: 23 U/L (ref 11–51)

## 2017-03-23 MED ORDER — ACYCLOVIR 800 MG PO TABS: 800.0000 mg | ORAL_TABLET | Freq: Every day | ORAL | 0 refills | Status: DC

## 2017-03-23 MED ORDER — KETOROLAC TROMETHAMINE 30 MG/ML IJ SOLN
15.0000 mg | INTRAMUSCULAR | Status: AC
  Administered 2017-03-23: 15 mg via INTRAVENOUS
  Filled 2017-03-23: qty 1

## 2017-03-23 MED ORDER — SODIUM CHLORIDE 0.9 % IV BOLUS (SEPSIS)
1000.0000 mL | Freq: Once | INTRAVENOUS | Status: AC
  Administered 2017-03-23: 1000 mL via INTRAVENOUS

## 2017-03-23 NOTE — ED Provider Notes (Signed)
[facility] Emergency Department Provider Note  ____________________________________________  Time seen: Approximately 12:37 PM  I have reviewed the triage vital signs and the nursing notes.   HISTORY  Chief Complaint Dizziness    HPI Barbara Arellano is a 41 y.o. female who complains of left lower chest rib pain that started this morning when she woke up. Gradual onset, not sudden. She was feeling normal when she went to bed last night. She does note that she eats and drinks very intermittently because she works 2 jobs and has long work hours.  nonexertional, slightly worse with deep breathing and with moving the left arm.. No shortness of breath. Nonradiating. No vomiting or diaphoresis. Does report that when the pain was particularly severe today she got lightheaded and thought she passed out briefly. She was assisted to the ground so that she did not get hurt. No alleviating factors to the pain. Moderate intensity. Described as sharp. Constant. No recent travel, hospitalization surgery. No history of DVT or PE.     Past Medical History:  Diagnosis Date  . Cervical arthritis ([place])      There are no active problems to display for this patient.    Past Surgical History:  Procedure Laterality Date  . FRACTURE SURGERY    . OOPHORECTOMY       Prior to Admission medications   Medication Sig Start Date End Date Taking? Authorizing Provider  ibuprofen (ADVIL,MOTRIN) 600 MG tablet Take 1 tablet (600 mg total) by mouth every 8 (eight) hours as needed. 01/17/17  Yes Sable Feil, PA-C  azithromycin (ZITHROMAX Z-PAK) 250 MG tablet Take 2 tablets (500 mg) on  Day 1,  followed by 1 tablet (250 mg) once daily on Days 2 through 5. Patient not taking: Reported on 03/23/2017 06/11/16   Johnn Hai, PA-C  benzonatate (TESSALON PERLES) 100 MG capsule Take 2 capsules (200 mg total) by mouth 3 (three) times daily as needed. Patient not taking: Reported on  03/23/2017 06/11/16 06/11/17  Johnn Hai, PA-C  cyclobenzaprine (FLEXERIL) 10 MG tablet Take 1 tablet (10 mg total) by mouth 3 (three) times daily as needed. Patient not taking: Reported on 03/23/2017 12/27/16   Sable Feil, PA-C  guaiFENesin-codeine 100-10 MG/5ML syrup Take 5 mLs by mouth every 6 (six) hours as needed for cough. Patient not taking: Reported on 03/23/2017 07/25/16   Harvest Dark, MD  magic mouthwash w/lidocaine SOLN Take 5 mLs by mouth 4 (four) times daily. Oral swish and swallow Patient not taking: Reported on 03/23/2017 01/17/17   Sable Feil, PA-C  methylPREDNISolone (MEDROL DOSEPAK) 4 MG TBPK tablet Take Tapered dose as directed Patient not taking: Reported on 03/23/2017 12/27/16   Sable Feil, PA-C  promethazine (PHENERGAN) 25 MG tablet Take 1 tablet (25 mg total) by mouth every 6 (six) hours as needed for nausea or vomiting. Patient not taking: Reported on 03/23/2017 07/25/16   Harvest Dark, MD  traMADol (ULTRAM) 50 MG tablet Take 1 tablet (50 mg total) by mouth every 6 (six) hours as needed for moderate pain. Patient not taking: Reported on 03/23/2017 12/27/16   Sable Feil, PA-C     Allergies Patient has no known allergies.   No family history on file.  Social History Social History  Substance Use Topics  . Smoking status: Current Every Day Smoker    Packs/day: 0.50    Types: Cigarettes  . Smokeless tobacco: Never Used  . Alcohol use No  Review of Systems  Constitutional:   No fever or chills.  ENT:   No sore throat. No rhinorrhea. Cardiovascular:  positive as above for left chest wall pain. Positive syncope.Marland Kitchen Respiratory:   No dyspnea or cough. Gastrointestinal:   Negative for abdominal pain, vomiting and diarrhea.  Musculoskeletal:   Negative for focal pain or swelling All other systems reviewed and are negative except as documented above in ROS and HPI.  ____________________________________________   PHYSICAL  EXAM:  VITAL SIGNS: ED Triage Vitals [03/23/17 0857]  Enc Vitals Group     BP 123/73     Pulse Rate 96     Resp 16     Temp 98.1 F (36.7 C)     Temp Source Oral     SpO2 100 %     Weight 158 lb (71.7 kg)     Height 5\' 4"  (1.626 m)     Head Circumference      Peak Flow      Pain Score      Pain Loc      Pain Edu?      Excl. in [place]?     Vital signs reviewed, nursing assessments reviewed.   Constitutional:   Alert and oriented. Well appearing and in no distress. Eyes:   No scleral icterus.  EOMI. No nystagmus. No conjunctival pallor. PERRL. ENT   Head:   Normocephalic and atraumatic.   Nose:   No congestion/rhinnorhea.    Mouth/Throat:   MMM, no pharyngeal erythema. No peritonsillar mass.    Neck:   No meningismus. Full ROM Hematological/Lymphatic/Immunilogical:   No cervical lymphadenopathy. Cardiovascular:   RRR. Symmetric bilateral radial and DP pulses.  No murmurs.  Respiratory:   Normal respiratory effort without tachypnea/retractions. Breath sounds are clear and equal bilaterally. No wheezes/rales/rhonchi. Gastrointestinal:   Soft and nontender. Non distended. There is no CVA tenderness.  No rebound, rigidity, or guarding. Genitourinary:   deferred Musculoskeletal:   Normal range of motion in all extremities. No joint effusions.  No lower extremity tenderness.  No edema.left lower chest wall tender to the touch on the inferior ribs anteriorly and laterally and posteriorly.  Neurologic:   Normal speech and language.  Motor grossly intact. No gross focal neurologic deficits are appreciated.  Skin:    Skin is warm, dry and intact. No rash noted.  No petechiae, purpura, or bullae.  ____________________________________________    LABS (pertinent positives/negatives) (all labs ordered are listed, but only abnormal results are displayed) Labs Reviewed  COMPREHENSIVE METABOLIC PANEL - Abnormal; Notable for the following:       Result Value   ALT 13 (*)     All other components within normal limits  CBC WITH DIFFERENTIAL/PLATELET - Abnormal; Notable for the following:    WBC 12.6 (*)    Neutro Abs 8.9 (*)    All other components within normal limits  LIPASE, BLOOD  FIBRIN DERIVATIVES D-DIMER ([facility] ONLY)   ____________________________________________   EKG  interpreted by me  Date: 03/23/2017  Rate: 71  Rhythm: normal sinus rhythm  QRS Axis: normal  Intervals: normal  ST/T Wave abnormalities: normal  Conduction Disutrbances: none  Narrative Interpretation: unremarkable      ____________________________________________    RADIOLOGY  Dg Chest 2 View  Result Date: 03/23/2017 CLINICAL DATA:  Left chest pain today. History of chronic cough and shortness of breath. No known injury. Smoker. EXAM: CHEST  2 VIEW COMPARISON:  Radiographs 06/11/2016 and 06/01/2016. FINDINGS: The heart size and mediastinal contours  are normal. The lungs are clear. There is no pleural effusion or pneumothorax. No acute osseous findings are identified. There are stable mild degenerative changes within the spine associated with a scoliosis. IMPRESSION: Stable chest.  No active cardiopulmonary process. Electronically Signed   By: Richardean Sale M.D.   On: 03/23/2017 10:18    ____________________________________________   PROCEDURES Procedures  ____________________________________________   INITIAL IMPRESSION / ASSESSMENT AND PLAN / ED COURSE  Pertinent labs & imaging results that were available during my care of the patient were reviewed by me and considered in my medical decision making (see chart for details).  patient well appearing no acute distress, presents with atypical chest pain. Exam is consistent with a chest wall strain. Labs are unremarkable, EKG unremarkable. D-dimer negative. Vitals unremarkable. Patient feeling better after Toradol, sitting upright and eating and drinking. Suitable for outpatient follow-up.  Considering the patient's  symptoms, medical history, and physical examination today, I have low suspicion for ACS, PE, TAD, pneumothorax, carditis, mediastinitis, pneumonia, CHF, or sepsis.        ____________________________________________   FINAL CLINICAL IMPRESSION(S) / ED DIAGNOSES  Final diagnoses:  Chest wall pain      New Prescriptions   No medications on file     Portions of this note were generated with dragon dictation software. Dictation errors may occur despite best attempts at proofreading.    Carrie Mew, MD 03/23/17 1247

## 2017-03-23 NOTE — ED Notes (Signed)
Pt via pov from work with left sided abdominal/ribcage/lower chest pain. States she has not had anything like this prior. She reports feeling dizzy when she got to work and passed out, was caught before reaching the ground. Pt alert & oriented with NAD noted.

## 2017-03-23 NOTE — ED Triage Notes (Signed)
Pt c/o waking this morning with rib pain, states she went on to work and then had sudden onset dizziness with blurred vision and numbness "all over" states she thought maybe her sugar was low because she works to jobs and only eats small snacks.Marland Kitchen

## 2017-05-10 ENCOUNTER — Encounter: Payer: Self-pay | Admitting: Emergency Medicine

## 2017-05-10 ENCOUNTER — Emergency Department
Admission: EM | Admit: 2017-05-10 | Discharge: 2017-05-10 | Disposition: A | Discharge status: Home / Self Care | Payer: Self-pay | Attending: Emergency Medicine | Admitting: Emergency Medicine

## 2017-05-10 DIAGNOSIS — K1121 Acute sialoadenitis: Secondary | ICD-10-CM | POA: Insufficient documentation

## 2017-05-10 DIAGNOSIS — G43009 Migraine without aura, not intractable, without status migrainosus: Secondary | ICD-10-CM

## 2017-05-10 DIAGNOSIS — M5412 Radiculopathy, cervical region: Secondary | ICD-10-CM

## 2017-05-10 DIAGNOSIS — F17210 Nicotine dependence, cigarettes, uncomplicated: Secondary | ICD-10-CM | POA: Insufficient documentation

## 2017-05-10 DIAGNOSIS — K1120 Sialoadenitis, unspecified: Secondary | ICD-10-CM

## 2017-05-10 MED ORDER — SODIUM CHLORIDE 0.9 % IV BOLUS (SEPSIS)
1000.0000 mL | Freq: Once | INTRAVENOUS | Status: AC
  Administered 2017-05-10: 1000 mL via INTRAVENOUS

## 2017-05-10 MED ORDER — METHOCARBAMOL 500 MG PO TABS: 500.0000 mg | ORAL_TABLET | Freq: Four times a day (QID) | ORAL | 0 refills | Status: DC

## 2017-05-10 MED ORDER — AMOXICILLIN-POT CLAVULANATE 875-125 MG PO TABS: 1.0000 | ORAL_TABLET | Freq: Two times a day (BID) | ORAL | 0 refills | Status: DC

## 2017-05-10 MED ORDER — MELOXICAM 15 MG PO TABS: 15.0000 mg | ORAL_TABLET | Freq: Every day | ORAL | 0 refills | Status: DC

## 2017-05-10 MED ORDER — ORPHENADRINE CITRATE 30 MG/ML IJ SOLN
60.0000 mg | Freq: Once | INTRAMUSCULAR | Status: AC
  Administered 2017-05-10: 60 mg via INTRAVENOUS
  Filled 2017-05-10: qty 2

## 2017-05-10 MED ORDER — KETOROLAC TROMETHAMINE 30 MG/ML IJ SOLN
30.0000 mg | Freq: Once | INTRAMUSCULAR | Status: AC
  Administered 2017-05-10: 30 mg via INTRAVENOUS
  Filled 2017-05-10: qty 1

## 2017-05-10 MED ORDER — AMOXICILLIN-POT CLAVULANATE 875-125 MG PO TABS
1.0000 | ORAL_TABLET | Freq: Once | ORAL | Status: AC
  Administered 2017-05-10: 1 via ORAL
  Filled 2017-05-10: qty 1

## 2017-05-10 MED ORDER — PROMETHAZINE HCL 25 MG/ML IJ SOLN
25.0000 mg | Freq: Once | INTRAMUSCULAR | Status: AC
  Administered 2017-05-10: 25 mg via INTRAVENOUS
  Filled 2017-05-10: qty 1

## 2017-05-10 NOTE — ED Provider Notes (Signed)
[facility] Emergency Department Provider Note  ____________________________________________  Time seen: Approximately 8:12 PM  I have reviewed the triage vital signs and the nursing notes.   HISTORY  Chief Complaint Neck Pain    HPI Barbara Arellano is a 41 y.o. female who presents emergency department with 3 complaints.  Patient reports that she has had a migraine left-sided neck pain, right submandibular pain for several days.  Patient reports that she has a history of intermittent migraines.  This migraine is consistent with previous migraines with frontal headache, photophobia bilaterally, nausea.  Patient denies any recent head trauma.  No nasal congestion or sinus pressure.  Patient has a history of cervical radiculopathy and is complaining of increased symptoms with "knots in my neck"over the musculature.  Patient has had some intermittent numbness and tingling down the left arm which is consistent with her previous episodes of cervical radiculopathy.  Patient has tried ibuprofen with no relief.  Patient also endorses a "knot" in the right submandibular region.  Patient reports that she has pain over the night but no other tenderness.  No other areas are appreciated on self-exam.  Patient denies any nasal congestion, sore throat, cough, shortness of breath.  No chest pain, abdominal pain, emesis.  She does report that her headache has caused nausea which has kept her from eating and drinking much over the past several days.  Patient states that she feels somewhat dehydrated.  Past Medical History:  Diagnosis Date  . Cervical arthritis     There are no active problems to display for this patient.   Past Surgical History:  Procedure Laterality Date  . FRACTURE SURGERY    . OOPHORECTOMY      Prior to Admission medications   Medication Sig Start Date End Date Taking? Authorizing Provider  acyclovir (ZOVIRAX) 800 MG tablet Take 1 tablet (800 mg total) by  mouth 5 (five) times daily. Start taking if you develop a rash on your chest in the area of pain before 03/26/17. 03/23/17   Carrie Mew, MD  amoxicillin-clavulanate (AUGMENTIN) 875-125 MG tablet Take 1 tablet 2 (two) times daily by mouth. 05/10/17   Bianney Rockwood, Charline Bills, PA-C  azithromycin (ZITHROMAX Z-PAK) 250 MG tablet Take 2 tablets (500 mg) on  Day 1,  followed by 1 tablet (250 mg) once daily on Days 2 through 5. Patient not taking: Reported on 03/23/2017 06/11/16   Johnn Hai, PA-C  benzonatate (TESSALON PERLES) 100 MG capsule Take 2 capsules (200 mg total) by mouth 3 (three) times daily as needed. Patient not taking: Reported on 03/23/2017 06/11/16 06/11/17  Johnn Hai, PA-C  cyclobenzaprine (FLEXERIL) 10 MG tablet Take 1 tablet (10 mg total) by mouth 3 (three) times daily as needed. Patient not taking: Reported on 03/23/2017 12/27/16   Sable Feil, PA-C  guaiFENesin-codeine 100-10 MG/5ML syrup Take 5 mLs by mouth every 6 (six) hours as needed for cough. Patient not taking: Reported on 03/23/2017 07/25/16   Harvest Dark, MD  ibuprofen (ADVIL,MOTRIN) 600 MG tablet Take 1 tablet (600 mg total) by mouth every 8 (eight) hours as needed. 01/17/17   Sable Feil, PA-C  magic mouthwash w/lidocaine SOLN Take 5 mLs by mouth 4 (four) times daily. Oral swish and swallow Patient not taking: Reported on 03/23/2017 01/17/17   Sable Feil, PA-C  meloxicam (MOBIC) 15 MG tablet Take 1 tablet (15 mg total) daily by mouth. 05/10/17   Sheldon Sem, Charline Bills, PA-C  methocarbamol (ROBAXIN) 500 MG  tablet Take 1 tablet (500 mg total) 4 (four) times daily by mouth. 05/10/17   Jovahn Breit, Charline Bills, PA-C  methylPREDNISolone (MEDROL DOSEPAK) 4 MG TBPK tablet Take Tapered dose as directed Patient not taking: Reported on 03/23/2017 12/27/16   Sable Feil, PA-C  promethazine (PHENERGAN) 25 MG tablet Take 1 tablet (25 mg total) by mouth every 6 (six) hours as needed for nausea or vomiting. Patient  not taking: Reported on 03/23/2017 07/25/16   Harvest Dark, MD  traMADol (ULTRAM) 50 MG tablet Take 1 tablet (50 mg total) by mouth every 6 (six) hours as needed for moderate pain. Patient not taking: Reported on 03/23/2017 12/27/16   Sable Feil, PA-C    Allergies Patient has no known allergies.  No family history on file.  Social History Social History   Tobacco Use  . Smoking status: Current Every Day Smoker    Packs/day: 0.50    Types: Cigarettes  . Smokeless tobacco: Never Used  Substance Use Topics  . Alcohol use: No  . Drug use: No     Review of Systems  Constitutional: No fever/chills Eyes: No visual changes. No discharge. ENT: No upper respiratory complaints.  Positive for right submandibular "knot" Cardiovascular: no chest pain. Respiratory: no cough. No SOB. Gastrointestinal: No abdominal pain.  No nausea, no vomiting.  No diarrhea.  No constipation. Genitourinary: Negative for dysuria. No hematuria  Musculoskeletal: Back pain with radicular symptoms down the left arm Skin: Negative for rash, abrasions, lacerations, ecchymosis. Neurological: Positive for headache with photophobia but denies focal weakness or numbness. 10-point ROS otherwise negative.  ____________________________________________   PHYSICAL EXAM:  VITAL SIGNS: ED Triage Vitals  Enc Vitals Group     BP 05/10/17 1743 126/83     Pulse Rate 05/10/17 1743 88     Resp 05/10/17 1743 16     Temp 05/10/17 1743 98.7 F (37.1 C)     Temp Source 05/10/17 1743 Oral     SpO2 05/10/17 1743 97 %     Weight 05/10/17 1744 153 lb (69.4 kg)     Height 05/10/17 1744 5\' 4"  (1.626 m)     Head Circumference --      Peak Flow --      Pain Score --      Pain Loc --      Pain Edu? --      Excl. in [place]? --      Constitutional: Alert and oriented. Well appearing and in no acute distress. Eyes: Conjunctivae are normal. PERRL. EOMI. Head: Atraumatic.  Solitary salivary gland palpable to the right  submandibular region.  No tenderness to palpation over the parotid gland.  No regional lymphadenopathy. ENT:      Ears: EACs and TMs unremarkable bilaterally.      Nose: No congestion/rhinnorhea.      Mouth/Throat: Mucous membranes are moist.  No oropharyngeal erythema or edema noted. Neck: No stridor.  No midline cervical spine tenderness to palpation. Hematological/Lymphatic/Immunilogical: No cervical lymphadenopathy.  Diffusely tender to palpation throughout the left side cervical paraspinal muscle group and trapezius muscle.  Full range of motion to the left shoulder.  Examination of the elbow and wrist is unremarked pulse intact.  Sensation intact all 5 digits.  Capillary refill intact all 5 digits. Cardiovascular: Normal rate, regular rhythm. Normal S1 and S2.  Good peripheral circulation. Respiratory: Normal respiratory effort without tachypnea or retractions. Lungs CTAB. Good air entry to the bases with no decreased or absent breath sounds. Musculoskeletal: Full  range of motion to all extremities. No gross deformities appreciated. Neurologic:  Normal speech and language. No gross focal neurologic deficits are appreciated.  Cranial nerves II through XII grossly intact.  Negative pronator drift.  Negative Romberg's. Skin:  Skin is warm, dry and intact. No rash noted. Psychiatric: Mood and affect are normal. Speech and behavior are normal. Patient exhibits appropriate insight and judgement.   ____________________________________________   LABS (all labs ordered are listed, but only abnormal results are displayed)  Labs Reviewed - No data to display ____________________________________________  EKG   ____________________________________________  RADIOLOGY   No results found.  ____________________________________________    PROCEDURES  Procedure(s) performed:    Procedures    Medications  sodium chloride 0.9 % bolus 1,000 mL (0 mLs Intravenous Stopped 05/10/17 2142)   promethazine (PHENERGAN) injection 25 mg (25 mg Intravenous Given 05/10/17 2037)  ketorolac (TORADOL) 30 MG/ML injection 30 mg (30 mg Intravenous Given 05/10/17 2037)  orphenadrine (NORFLEX) injection 60 mg (60 mg Intravenous Given 05/10/17 2037)  amoxicillin-clavulanate (AUGMENTIN) 875-125 MG per tablet 1 tablet (1 tablet Oral Given 05/10/17 2037)     ____________________________________________   INITIAL IMPRESSION / ASSESSMENT AND PLAN / ED COURSE  Pertinent labs & imaging results that were available during my care of the patient were reviewed by me and considered in my medical decision making (see chart for details).  Review of the [place] CSRS was performed in accordance of the [place] prior to dispensing any controlled drugs.     Patient's diagnosis is consistent with migraine, cervical radiculopathy, sialoadenitis.  Patient presented with typical migraine headache.  Patient has tried ibuprofen without relief.  Exam is reassuring with no neurological deficits.  Patient has a history of cervical radiculopathy and states an increase in her symptoms over the past several days.  Motrin does not help this complaint.  Patient is also endorsing of a "knot" to the right submandibular region.  Palpation reveals area consistent with sialoadenitis.  Patient was given migraine cocktail and fluids in the emergency department for migraine relief. Patient will be discharged home with prescriptions for Augmentin, meloxicam, Robaxin.Marland Kitchen Patient is to follow up with primary care as needed or otherwise directed. Patient is given ED precautions to return to the ED for any worsening or new symptoms.     ____________________________________________  FINAL CLINICAL IMPRESSION(S) / ED DIAGNOSES  Final diagnoses:  Migraine without aura and without status migrainosus, not intractable  Cervical radiculopathy  Sialoadenitis      NEW MEDICATIONS STARTED DURING THIS VISIT:  ED Discharge Orders        Ordered     meloxicam (MOBIC) 15 MG tablet  Daily     05/10/17 2123    methocarbamol (ROBAXIN) 500 MG tablet  4 times daily     05/10/17 2123    amoxicillin-clavulanate (AUGMENTIN) 875-125 MG tablet  2 times daily     05/10/17 2123          This chart was dictated using voice recognition software/Dragon. Despite best efforts to proofread, errors can occur which can change the meaning. Any change was purely unintentional.    Darletta Moll, PA-C 05/10/17 2157    Hinda Kehr, MD 05/10/17 2218

## 2017-05-10 NOTE — ED Notes (Addendum)
Pt in NAD at time o fd/c, pt verbalizes d/c teaching and RX. PT waiting on ride in lobby. Pt in wheelchair to lobby

## 2017-05-10 NOTE — ED Triage Notes (Signed)
Patient presents to ED via POV from home with multiple complaints including eye pain, neck pain, left shoulder pain, irregular periods and sore throat. Patient states she was here before and told that she injured her cervical vertebrae. Patient ambulatory to room.

## 2017-07-16 ENCOUNTER — Emergency Department
Admission: EM | Admit: 2017-07-16 | Discharge: 2017-07-16 | Disposition: A | Discharge status: Home / Self Care | Payer: Self-pay | Attending: Emergency Medicine | Admitting: Emergency Medicine

## 2017-07-16 ENCOUNTER — Other Ambulatory Visit: Payer: Self-pay

## 2017-07-16 ENCOUNTER — Encounter: Payer: Self-pay | Admitting: Emergency Medicine

## 2017-07-16 DIAGNOSIS — K047 Periapical abscess without sinus: Secondary | ICD-10-CM | POA: Insufficient documentation

## 2017-07-16 DIAGNOSIS — Z79899 Other long term (current) drug therapy: Secondary | ICD-10-CM | POA: Insufficient documentation

## 2017-07-16 DIAGNOSIS — F17210 Nicotine dependence, cigarettes, uncomplicated: Secondary | ICD-10-CM | POA: Insufficient documentation

## 2017-07-16 MED ORDER — TRAMADOL HCL 50 MG PO TABS: 50.0000 mg | ORAL_TABLET | Freq: Four times a day (QID) | ORAL | 0 refills | Status: DC | PRN

## 2017-07-16 MED ORDER — IBUPROFEN 800 MG PO TABS
800.0000 mg | ORAL_TABLET | Freq: Once | ORAL | Status: AC
  Administered 2017-07-16: 800 mg via ORAL
  Filled 2017-07-16: qty 1

## 2017-07-16 MED ORDER — AMOXICILLIN 500 MG PO CAPS: 500.0000 mg | ORAL_CAPSULE | Freq: Three times a day (TID) | ORAL | 0 refills | Status: DC

## 2017-07-16 MED ORDER — IBUPROFEN 800 MG PO TABS: 800.0000 mg | ORAL_TABLET | Freq: Three times a day (TID) | ORAL | 0 refills | Status: DC | PRN

## 2017-07-16 NOTE — ED Notes (Signed)
Pt c/o facial swelling related to dental pain.  Pt states she has been taking Tylenol but no relief.  Pt also states she needs a work note for tomorrow.

## 2017-07-16 NOTE — ED Provider Notes (Signed)
[facility] Emergency Department Provider Note   ____________________________________________   First MD Initiated Contact with Patient 07/16/17 0901     (approximate)  I have reviewed the triage vital signs and the nursing notes.   HISTORY  Chief Complaint Dental Pain    HPI Barbara Arellano is a 42 y.o. female patient complaining her right lateral dental pain which began last night.  Patient said he awakened this morning with facial swelling and increased pain.  Patient has a history of devitalized teeth that have not follow-up with dentist in many years.  She rates  pain as a 10/10.  Patient describes her pain as "achy/throbbing".  No palliative measured for complaint.  Past Medical History:  Diagnosis Date  . Cervical arthritis     There are no active problems to display for this patient.   Past Surgical History:  Procedure Laterality Date  . FRACTURE SURGERY    . OOPHORECTOMY      Prior to Admission medications   Medication Sig Start Date End Date Taking? Authorizing Provider  acyclovir (ZOVIRAX) 800 MG tablet Take 1 tablet (800 mg total) by mouth 5 (five) times daily. Start taking if you develop a rash on your chest in the area of pain before 03/26/17. 03/23/17   Carrie Mew, MD  amoxicillin (AMOXIL) 500 MG capsule Take 1 capsule (500 mg total) by mouth 3 (three) times daily. 07/16/17   Sable Feil, PA-C  amoxicillin-clavulanate (AUGMENTIN) 875-125 MG tablet Take 1 tablet 2 (two) times daily by mouth. 05/10/17   Cuthriell, Charline Bills, PA-C  azithromycin (ZITHROMAX Z-PAK) 250 MG tablet Take 2 tablets (500 mg) on  Day 1,  followed by 1 tablet (250 mg) once daily on Days 2 through 5. Patient not taking: Reported on 03/23/2017 06/11/16   Johnn Hai, PA-C  cyclobenzaprine (FLEXERIL) 10 MG tablet Take 1 tablet (10 mg total) by mouth 3 (three) times daily as needed. Patient not taking: Reported on 03/23/2017 12/27/16   Sable Feil, PA-C   guaiFENesin-codeine 100-10 MG/5ML syrup Take 5 mLs by mouth every 6 (six) hours as needed for cough. Patient not taking: Reported on 03/23/2017 07/25/16   Harvest Dark, MD  ibuprofen (ADVIL,MOTRIN) 600 MG tablet Take 1 tablet (600 mg total) by mouth every 8 (eight) hours as needed. 01/17/17   Sable Feil, PA-C  ibuprofen (ADVIL,MOTRIN) 800 MG tablet Take 1 tablet (800 mg total) by mouth every 8 (eight) hours as needed. 07/16/17   Sable Feil, PA-C  magic mouthwash w/lidocaine SOLN Take 5 mLs by mouth 4 (four) times daily. Oral swish and swallow Patient not taking: Reported on 03/23/2017 01/17/17   Sable Feil, PA-C  meloxicam (MOBIC) 15 MG tablet Take 1 tablet (15 mg total) daily by mouth. 05/10/17   Cuthriell, Charline Bills, PA-C  methocarbamol (ROBAXIN) 500 MG tablet Take 1 tablet (500 mg total) 4 (four) times daily by mouth. 05/10/17   Cuthriell, Charline Bills, PA-C  methylPREDNISolone (MEDROL DOSEPAK) 4 MG TBPK tablet Take Tapered dose as directed Patient not taking: Reported on 03/23/2017 12/27/16   Sable Feil, PA-C  promethazine (PHENERGAN) 25 MG tablet Take 1 tablet (25 mg total) by mouth every 6 (six) hours as needed for nausea or vomiting. Patient not taking: Reported on 03/23/2017 07/25/16   Harvest Dark, MD  traMADol (ULTRAM) 50 MG tablet Take 1 tablet (50 mg total) by mouth every 6 (six) hours as needed for moderate pain. Patient not taking: Reported  on 03/23/2017 12/27/16   Sable Feil, PA-C  traMADol (ULTRAM) 50 MG tablet Take 1 tablet (50 mg total) by mouth every 6 (six) hours as needed for moderate pain. 07/16/17   Sable Feil, PA-C    Allergies Patient has no known allergies.  No family history on file.  Social History Social History   Tobacco Use  . Smoking status: Current Every Day Smoker    Packs/day: 0.50    Types: Cigarettes  . Smokeless tobacco: Never Used  Substance Use Topics  . Alcohol use: No  . Drug use: No    Review of  Systems Constitutional: No fever/chills Eyes: No visual changes. ENT: Sore throat and dental pain cardiovascular: Denies chest pain. Respiratory: Denies shortness of breath. Gastrointestinal: No abdominal pain.  No nausea, no vomiting.  No diarrhea.  No constipation. Genitourinary: Negative for dysuria. Musculoskeletal: Negative for back pain. Skin: Negative for rash. Neurological: Negative for headaches, focal weakness or numbness.   ____________________________________________   PHYSICAL EXAM:  VITAL SIGNS: ED Triage Vitals [07/16/17 0849]  Enc Vitals Group     BP 126/82     Pulse Rate 91     Resp 20     Temp 98.3 F (36.8 C)     Temp Source Oral     SpO2 98 %     Weight 149 lb (67.6 kg)     Height 5\' 4"  (1.626 m)     Head Circumference      Peak Flow      Pain Score 10     Pain Loc      Pain Edu?      Excl. in [place]?     Constitutional: Alert and oriented. Well appearing and in no acute distress. Mouth/Throat: Mucous membranes are moist.  Oropharynx non-erythematous.  Devitalized tooth #27 with gingival edema. Neck: No stridor.   Hematological/Lymphatic/Immunilogical: No cervical lymphadenopathy. Cardiovascular: Normal rate, regular rhythm. Grossly normal heart sounds.  Good peripheral circulation. Respiratory: Normal respiratory effort.  No retractions. Lungs CTAB. Skin:  Skin is warm, dry and intact. No rash noted. Psychiatric: Mood and affect are normal. Speech and behavior are normal.  ____________________________________________   LABS (all labs ordered are listed, but only abnormal results are displayed)  Labs Reviewed - No data to display ____________________________________________  EKG   ____________________________________________  RADIOLOGY  No results found.  ____________________________________________   PROCEDURES  Procedure(s) performed: None  Procedures  Critical Care performed:  No  ____________________________________________   INITIAL IMPRESSION / ASSESSMENT AND PLAN / ED COURSE  As part of my medical decision making, I reviewed the following data within the electronic MEDICAL RECORD NUMBER    The pain secondary to abscess.  Patient given discharge care instructions advised to follow-up from list of dental clinics.  Patient given a work note.  Patient advised take medication as directed.      ____________________________________________   FINAL CLINICAL IMPRESSION(S) / ED DIAGNOSES  Final diagnoses:  Dental infection     ED Discharge Orders        Ordered    amoxicillin (AMOXIL) 500 MG capsule  3 times daily     07/16/17 0909    traMADol (ULTRAM) 50 MG tablet  Every 6 hours PRN     07/16/17 0909    ibuprofen (ADVIL,MOTRIN) 800 MG tablet  Every 8 hours PRN     07/16/17 0909       Note:  This document was prepared using Dragon voice recognition software and may  include unintentional dictation errors.    Sable Feil, PA-C 07/16/17 0913    Harvest Dark, MD 07/16/17 [phone]

## 2017-07-16 NOTE — Discharge Instructions (Signed)
Advised to follow-up from list of dental clinics: [place]  [place] [facility] and [place] [email or web address] [phone])  [facility] [phone])  [place] [phone])  [place] [phone])  [facility] [phone]) This clinic caters to the indigent population and is on a lottery system. Location: [employer] [employer] [facility], [place], [place], [place] Clinic Hours: Wednesdays from 6pm - 9pm, patients seen by a lottery system. For dates, call or go to [email or web address] Services: Cleanings, fillings and simple extractions. Payment Options: DENTAL WORK IS FREE OF CHARGE. Bring proof of income or support. Best way to get seen: Arrive at 5:15 pm - this is a lottery, NOT first come/first serve, so arriving earlier will not increase your chances of being seen.     [facility] [phone] Select option 1 for emergencies   Location: [facility], [place], [address], [place] Clinic Hours: No walk-ins accepted - call the day before to schedule an appointment. Check in times are 9:30 am and 1:30 pm. Services: Simple extractions, temporary fillings, pulpectomy/pulp debridement, uncomplicated abscess drainage. Payment Options: PAYMENT IS DUE AT THE TIME OF SERVICE.  Fee is usually $100-200, additional surgical procedures (e.g. abscess drainage) may be extra. Cash, checks, Visa/MasterCard accepted.  Can file Medicaid if patient is covered for dental - patient should call case worker to check. No discount for [facility] patients. Best way to get seen: MUST call the day before and get onto the schedule. Can usually be seen the next 1-2 days. No walk-ins accepted.      [place] [phone]   Location: [place], [place] Clinic Hours: M, W, Th, F 8am or 1:30pm, Tues 9a or 1:30 - first come/first served. Services: Simple extractions, temporary fillings, uncomplicated abscess drainage.  You do not need to be an [facility] resident. Payment Options: PAYMENT IS DUE AT THE TIME OF SERVICE. Dental insurance, otherwise sliding scale - bring proof of income or support. Depending on income and treatment needed, cost is usually $50-200. Best way to get seen: Arrive early as it is first come/first served.     [facility] [phone]   Location: [place] [facility] Hours: Mon-Thu 8a-5p Services: Most basic dental services including extractions and fillings. Payment Options: PAYMENT IS DUE AT THE TIME OF SERVICE. Sliding scale, up to 50% off - bring proof if income or support. Medicaid with dental option accepted. Best way to get seen: Call to schedule an appointment, can usually be seen within 2 weeks OR they will try to see walk-ins - show up at [place] or 2p (you may have to wait).     [facility] [place] RESIDENTS ONLY   Location: [facility], [place] [address], [place], [place] [address] Clinic Hours: By appointment only. Monday - Thursday 8am-5pm, Friday 8am-12pm Services: Cleanings, fillings, extractions. Payment Options: PAYMENT IS DUE AT THE TIME OF SERVICE. Cash, Visa or MasterCard. Sliding scale - $30 minimum per service. Best way to get seen: Come in to office, complete packet and make an appointment - need proof of income or support monies for each household member and proof of [facility] residence. Usually takes about a month to get in.     [facility] °[phone] °  °Location: °[address] [place] °Clinic Hours: Walk-in Urgent Care  Dental Services are offered Monday-Friday mornings only. °The numbers of emergencies accepted daily is limited to the number of °providers available. °Maximum 15 - Mondays, Wednesdays & Thursdays °Maximum 10 - Tuesdays & Fridays °Services: °You do not need to be a [place] resident to be seen for a dental emergency. °Emergencies are defined as pain, swelling, abnormal bleeding, or dental trauma. Walkins will receive x-rays if needed. °NOTE: Dental cleaning is not an emergency. °Payment Options: °PAYMENT IS DUE AT THE TIME OF SERVICE. °Minimum co-pay is $40.00 for uninsured patients. °Minimum co-pay is $3.00 for Medicaid with dental coverage. °Dental Insurance is accepted and must be presented at time of visit. °Medicare does not cover dental. °Forms of payment: Cash, credit card, checks. °Best way to get seen: °If not previously registered with the clinic, walk-in dental registration begins at 7:15 am and is on a first come/first serve basis. °If previously registered with the clinic, call to make an appointment. °  °  °[facility] °[phone] °[place] RESIDENTS ONLY °  °Location: °[address], [place], [place] °Clinic Hours: °Mon-Thu 10a-2p °Services: Extractions only! °Payment Options: °FREE (donations accepted) - bring proof of income or support °Best way to get seen: °Call and schedule an appointment OR come at 8am on the 1st Monday of every month (except for holidays) when it is first come/first served. °  °  °[facility] °[phone] °  °Location: °[address], [place] °Clinic Hours: °Friday mornings °Services, Payment Options, Best way to get seen: °Call for info ° °

## 2017-07-16 NOTE — ED Triage Notes (Signed)
Patient to ER for c/o dental pain to right lower dentition. Patient c/o facial swelling to that side as well.

## 2017-09-21 ENCOUNTER — Emergency Department: Payer: Self-pay

## 2017-09-21 ENCOUNTER — Emergency Department
Admission: EM | Admit: 2017-09-21 | Discharge: 2017-09-21 | Disposition: A | Discharge status: Home / Self Care | Payer: Self-pay | Attending: Emergency Medicine | Admitting: Emergency Medicine

## 2017-09-21 ENCOUNTER — Encounter: Payer: Self-pay | Admitting: Emergency Medicine

## 2017-09-21 DIAGNOSIS — N939 Abnormal uterine and vaginal bleeding, unspecified: Secondary | ICD-10-CM | POA: Insufficient documentation

## 2017-09-21 DIAGNOSIS — F17210 Nicotine dependence, cigarettes, uncomplicated: Secondary | ICD-10-CM | POA: Insufficient documentation

## 2017-09-21 DIAGNOSIS — R1031 Right lower quadrant pain: Secondary | ICD-10-CM | POA: Insufficient documentation

## 2017-09-21 DIAGNOSIS — Z79899 Other long term (current) drug therapy: Secondary | ICD-10-CM | POA: Insufficient documentation

## 2017-09-21 LAB — COMPREHENSIVE METABOLIC PANEL
ALK PHOS: 72 U/L (ref 38–126)
ALT: 14 U/L (ref 14–54)
AST: 23 U/L (ref 15–41)
Albumin: 4.2 g/dL (ref 3.5–5.0)
Anion gap: 7 (ref 5–15)
BILIRUBIN TOTAL: 0.6 mg/dL (ref 0.3–1.2)
BUN: 11 mg/dL (ref 6–20)
CALCIUM: 8.9 mg/dL (ref 8.9–10.3)
CO2: 26 mmol/L (ref 22–32)
CREATININE: 0.38 mg/dL — AB (ref 0.44–1.00)
Chloride: 104 mmol/L (ref 101–111)
Glucose, Bld: 101 mg/dL — ABNORMAL HIGH (ref 65–99)
Potassium: 3.4 mmol/L — ABNORMAL LOW (ref 3.5–5.1)
Sodium: 137 mmol/L (ref 135–145)
TOTAL PROTEIN: 7.4 g/dL (ref 6.5–8.1)

## 2017-09-21 LAB — CBC
HCT: 36.1 % (ref 35.0–47.0)
Hemoglobin: 12.2 g/dL (ref 12.0–16.0)
MCH: 30.8 pg (ref 26.0–34.0)
MCHC: 33.7 g/dL (ref 32.0–36.0)
MCV: 91.3 fL (ref 80.0–100.0)
PLATELETS: 191 10*3/uL (ref 150–440)
RBC: 3.95 MIL/uL (ref 3.80–5.20)
RDW: 15.1 % — AB (ref 11.5–14.5)
WBC: 8.4 10*3/uL (ref 3.6–11.0)

## 2017-09-21 LAB — URINALYSIS, COMPLETE (UACMP) WITH MICROSCOPIC
BILIRUBIN URINE: NEGATIVE
Bacteria, UA: NONE SEEN
GLUCOSE, UA: NEGATIVE mg/dL
Ketones, ur: NEGATIVE mg/dL
Nitrite: NEGATIVE
PH: 6 (ref 5.0–8.0)
Protein, ur: NEGATIVE mg/dL
SPECIFIC GRAVITY, URINE: 1.009 (ref 1.005–1.030)

## 2017-09-21 LAB — CHLAMYDIA/NGC RT PCR (ARMC ONLY)
Chlamydia Tr: NOT DETECTED
N GONORRHOEAE: NOT DETECTED

## 2017-09-21 LAB — POCT PREGNANCY, URINE: Preg Test, Ur: NEGATIVE

## 2017-09-21 LAB — WET PREP, GENITAL
Clue Cells Wet Prep HPF POC: NONE SEEN
SPERM: NONE SEEN
TRICH WET PREP: NONE SEEN
YEAST WET PREP: NONE SEEN

## 2017-09-21 LAB — LIPASE, BLOOD: LIPASE: 46 U/L (ref 11–51)

## 2017-09-21 MED ORDER — FENTANYL CITRATE (PF) 100 MCG/2ML IJ SOLN: 50.0000 ug | Freq: Once | INTRAMUSCULAR | Status: DC

## 2017-09-21 MED ORDER — IOPAMIDOL (ISOVUE-300) INJECTION 61%
100.0000 mL | Freq: Once | INTRAVENOUS | Status: AC | PRN
  Administered 2017-09-21: 100 mL via INTRAVENOUS

## 2017-09-21 MED ORDER — KETOROLAC TROMETHAMINE 30 MG/ML IJ SOLN
15.0000 mg | Freq: Once | INTRAMUSCULAR | Status: AC
  Administered 2017-09-21: 15 mg via INTRAVENOUS
  Filled 2017-09-21: qty 1

## 2017-09-21 MED ORDER — ONDANSETRON HCL 4 MG/2ML IJ SOLN
4.0000 mg | Freq: Once | INTRAMUSCULAR | Status: AC
  Administered 2017-09-21: 4 mg via INTRAVENOUS
  Filled 2017-09-21: qty 2

## 2017-09-21 NOTE — Discharge Instructions (Signed)

## 2017-09-21 NOTE — ED Notes (Signed)
Pt remains in [place] at this time.

## 2017-09-21 NOTE — ED Provider Notes (Signed)
[facility] Emergency Department Provider Note  ____________________________________________  Time seen: Approximately 11:50 AM  I have reviewed the triage vital signs and the nursing notes.   HISTORY  Chief Complaint Abdominal Pain   HPI Barbara Arellano is a 42 y.o. female with history of ovarian cysts who presents for evaluation ofabdominal pain and vaginal bleeding. Patient reports that for the last 6 months she has been having abnormal menstrual periods. 3 days ago patient reports that she started having very heavy vaginal bleeding and has been passing large clots. She is also complaining of lower sharp 10 out of 10 constant abdominal pain for 3 days that is nonradiating but worse when she coughs or bears down. No diarrhea, constipation, nausea, vomiting, dizziness, fever, chills. Patient reports that she has had a prior left oophorectomy and bilateral salpingectomy several years ago. No other abdominal surgeries.  Past Medical History:  Diagnosis Date  . Cervical arthritis     Past Surgical History:  Procedure Laterality Date  . FRACTURE SURGERY    . OOPHORECTOMY      Prior to Admission medications   Medication Sig Start Date End Date Taking? Authorizing Provider  acyclovir (ZOVIRAX) 800 MG tablet Take 1 tablet (800 mg total) by mouth 5 (five) times daily. Start taking if you develop a rash on your chest in the area of pain before 03/26/17. 03/23/17   Carrie Mew, MD  amoxicillin (AMOXIL) 500 MG capsule Take 1 capsule (500 mg total) by mouth 3 (three) times daily. Patient not taking: Reported on 09/21/2017 07/16/17   Sable Feil, PA-C  amoxicillin-clavulanate (AUGMENTIN) 875-125 MG tablet Take 1 tablet 2 (two) times daily by mouth. Patient not taking: Reported on 09/21/2017 05/10/17   Cuthriell, Charline Bills, PA-C  azithromycin (ZITHROMAX Z-PAK) 250 MG tablet Take 2 tablets (500 mg) on  Day 1,  followed by 1 tablet (250 mg) once daily on Days 2  through 5. Patient not taking: Reported on 03/23/2017 06/11/16   Johnn Hai, PA-C  cyclobenzaprine (FLEXERIL) 10 MG tablet Take 1 tablet (10 mg total) by mouth 3 (three) times daily as needed. Patient not taking: Reported on 03/23/2017 12/27/16   Sable Feil, PA-C  guaiFENesin-codeine 100-10 MG/5ML syrup Take 5 mLs by mouth every 6 (six) hours as needed for cough. Patient not taking: Reported on 09/21/2017 07/25/16   Harvest Dark, MD  ibuprofen (ADVIL,MOTRIN) 600 MG tablet Take 1 tablet (600 mg total) by mouth every 8 (eight) hours as needed. 01/17/17   Sable Feil, PA-C  ibuprofen (ADVIL,MOTRIN) 800 MG tablet Take 1 tablet (800 mg total) by mouth every 8 (eight) hours as needed. 07/16/17   Sable Feil, PA-C  magic mouthwash w/lidocaine SOLN Take 5 mLs by mouth 4 (four) times daily. Oral swish and swallow Patient not taking: Reported on 03/23/2017 01/17/17   Sable Feil, PA-C  meloxicam (MOBIC) 15 MG tablet Take 1 tablet (15 mg total) daily by mouth. 05/10/17   Cuthriell, Charline Bills, PA-C  methocarbamol (ROBAXIN) 500 MG tablet Take 1 tablet (500 mg total) 4 (four) times daily by mouth. 05/10/17   Cuthriell, Charline Bills, PA-C  methylPREDNISolone (MEDROL DOSEPAK) 4 MG TBPK tablet Take Tapered dose as directed Patient not taking: Reported on 03/23/2017 12/27/16   Sable Feil, PA-C  promethazine (PHENERGAN) 25 MG tablet Take 1 tablet (25 mg total) by mouth every 6 (six) hours as needed for nausea or vomiting. Patient not taking: Reported on 03/23/2017 07/25/16  Harvest Dark, MD  traMADol (ULTRAM) 50 MG tablet Take 1 tablet (50 mg total) by mouth every 6 (six) hours as needed for moderate pain. Patient not taking: Reported on 03/23/2017 12/27/16   Sable Feil, PA-C  traMADol (ULTRAM) 50 MG tablet Take 1 tablet (50 mg total) by mouth every 6 (six) hours as needed for moderate pain. 07/16/17   Sable Feil, PA-C    Allergies Patient has no known allergies.  No family  history on file.  Social History Social History   Tobacco Use  . Smoking status: Current Every Day Smoker    Packs/day: 0.50    Types: Cigarettes  . Smokeless tobacco: Never Used  Substance Use Topics  . Alcohol use: No  . Drug use: No    Review of Systems  Constitutional: Negative for fever. Eyes: Negative for visual changes. ENT: Negative for sore throat. Neck: No neck pain  Cardiovascular: Negative for chest pain. Respiratory: Negative for shortness of breath. Gastrointestinal: + lower abdominal pain. No vomiting or diarrhea. Genitourinary: Negative for dysuria. + vaginal bleeding Musculoskeletal: Negative for back pain. Skin: Negative for rash. Neurological: Negative for headaches, weakness or numbness. Psych: No SI or HI  ____________________________________________   PHYSICAL EXAM:  VITAL SIGNS: ED Triage Vitals  Enc Vitals Group     BP 09/21/17 1004 111/76     Pulse Rate 09/21/17 1004 88     Resp 09/21/17 1004 18     Temp 09/21/17 1004 98.3 F (36.8 C)     Temp Source 09/21/17 1004 Oral     SpO2 09/21/17 1004 100 %     Weight 09/21/17 1004 149 lb (67.6 kg)     Height 09/21/17 1004 5\' 4"  (1.626 m)     Head Circumference --      Peak Flow --      Pain Score 09/21/17 1007 10     Pain Loc --      Pain Edu? --      Excl. in [place]? --     Constitutional: Alert and oriented. Well appearing and in no apparent distress. HEENT:      Head: Normocephalic and atraumatic.         Eyes: Conjunctivae are normal. Sclera is non-icteric.       Mouth/Throat: Mucous membranes are moist.       Neck: Supple with no signs of meningismus. Cardiovascular: Regular rate and rhythm. No murmurs, gallops, or rubs. 2+ symmetrical distal pulses are present in all extremities. No JVD. Respiratory: Normal respiratory effort. Lungs are clear to auscultation bilaterally. No wheezes, crackles, or rhonchi.  Gastrointestinal: Soft, tender to palpation on the RLQ, and non distended with  positive bowel sounds. No rebound or guarding. Genitourinary: No CVA tenderness.  Pelvic exam: Normal external genitalia, no rashes or lesions. Blood in the vaginal vault coming form the os. Os closed. No cervical motion tenderness.  ttp over the R adnexa Musculoskeletal: Nontender with normal range of motion in all extremities. No edema, cyanosis, or erythema of extremities. Neurologic: Normal speech and language. Face is symmetric. Moving all extremities. No gross focal neurologic deficits are appreciated. Skin: Skin is warm, dry and intact. No rash noted. Psychiatric: Mood and affect are normal. Speech and behavior are normal.  ____________________________________________   LABS (all labs ordered are listed, but only abnormal results are displayed)  Labs Reviewed  WET PREP, GENITAL - Abnormal; Notable for the following components:      Result Value   WBC, Wet  Prep HPF POC RARE (*)    All other components within normal limits  COMPREHENSIVE METABOLIC PANEL - Abnormal; Notable for the following components:   Potassium 3.4 (*)    Glucose, Bld 101 (*)    Creatinine, Ser 0.38 (*)    All other components within normal limits  CBC - Abnormal; Notable for the following components:   RDW 15.1 (*)    All other components within normal limits  URINALYSIS, COMPLETE (UACMP) WITH MICROSCOPIC - Abnormal; Notable for the following components:   Color, Urine YELLOW (*)    APPearance HAZY (*)    Hgb urine dipstick LARGE (*)    Leukocytes, UA SMALL (*)    Squamous Epithelial / LPF 0-5 (*)    All other components within normal limits  CHLAMYDIA/NGC RT PCR ([facility] ONLY)  LIPASE, BLOOD  POCT PREGNANCY, URINE   ____________________________________________  EKG  none  ____________________________________________  RADIOLOGY  I have personally reviewed the images performed during this visit and I agree with the Radiologist's read.   Interpretation by Radiologist:  US Pelvis  Transvanginal Non-ob (tv Only)  Result Date: 09/21/2017 CLINICAL DATA:  Right lower quadrant abdominal discomfort for the past 3 days. Vaginal bleeding for the past 4 days. Onset of the most recentmenstrual period was September 18, 2017. The left ovary is reportedly surgically absent. EXAM: TRANSABDOMINAL AND TRANSVAGINAL ULTRASOUND OF PELVIS DOPPLER ULTRASOUND OF OVARIES TECHNIQUE: Both transabdominal and transvaginal ultrasound examinations of the pelvis were performed. Transabdominal technique was performed for global imaging of the pelvis including uterus, ovaries, adnexal regions, and pelvic cul-de-sac. It was necessary to proceed with endovaginal exam following the transabdominal exam to visualize the uterus, endometrium, ovaries, and adnexal structures. Color and duplex Doppler ultrasound was utilized to evaluate blood flow to the ovaries. COMPARISON:  None in PACs FINDINGS: Uterus Measurements: 7.9 x 4.4 x 5.6 cm. No fibroids or other mass visualized. Endometrium Thickness: 6.7 mm.  No focal abnormality visualized. Right ovary Measurements: 4.5 x 2.5 x 2.8 cm. Normal appearance/no adnexal mass. Left ovary The left ovary was not visualized and has reportedly surgically absent. Pulsed Doppler evaluation of the right ovary demonstrates normal low-resistance arterial and venous waveforms. Other findings No abnormal free fluid. IMPRESSION: Normal appearance of the uterus, endometrium, and right ovary. The left ovary is reportedly surgically absent. Electronically Signed   By: David  Martinique M.D.   On: 09/21/2017 14:10   US Pelvis (transabdominal Only)  Result Date: 09/21/2017 CLINICAL DATA:  Right lower quadrant abdominal discomfort for the past 3 days. Vaginal bleeding for the past 4 days. Onset of the most recentmenstrual period was September 18, 2017. The left ovary is reportedly surgically absent. EXAM: TRANSABDOMINAL AND TRANSVAGINAL ULTRASOUND OF PELVIS DOPPLER ULTRASOUND OF OVARIES TECHNIQUE: Both  transabdominal and transvaginal ultrasound examinations of the pelvis were performed. Transabdominal technique was performed for global imaging of the pelvis including uterus, ovaries, adnexal regions, and pelvic cul-de-sac. It was necessary to proceed with endovaginal exam following the transabdominal exam to visualize the uterus, endometrium, ovaries, and adnexal structures. Color and duplex Doppler ultrasound was utilized to evaluate blood flow to the ovaries. COMPARISON:  None in PACs FINDINGS: Uterus Measurements: 7.9 x 4.4 x 5.6 cm. No fibroids or other mass visualized. Endometrium Thickness: 6.7 mm.  No focal abnormality visualized. Right ovary Measurements: 4.5 x 2.5 x 2.8 cm. Normal appearance/no adnexal mass. Left ovary The left ovary was not visualized and has reportedly surgically absent. Pulsed Doppler evaluation of the right ovary demonstrates normal  low-resistance arterial and venous waveforms. Other findings No abnormal free fluid. IMPRESSION: Normal appearance of the uterus, endometrium, and right ovary. The left ovary is reportedly surgically absent. Electronically Signed   By: David  Martinique M.D.   On: 09/21/2017 14:10   Ct Abdomen Pelvis W Contrast  Result Date: 09/21/2017 CLINICAL DATA:  Lower abdominal pain and vaginal bleeding. EXAM: CT ABDOMEN AND PELVIS WITH CONTRAST TECHNIQUE: Multidetector CT imaging of the abdomen and pelvis was performed using the standard protocol following bolus administration of intravenous contrast. CONTRAST:  180mL ISOVUE-300 IOPAMIDOL (ISOVUE-300) INJECTION 61% COMPARISON:  Pelvic ultrasound from same day. FINDINGS: Lower chest: No acute abnormality. Hepatobiliary: No focal liver abnormality is seen. No gallstones, gallbladder wall thickening, or biliary dilatation. Pancreas: Unremarkable. No pancreatic ductal dilatation or surrounding inflammatory changes. Spleen: Normal in size without focal abnormality. Adrenals/Urinary Tract: The adrenal glands are  unremarkable. Small subcentimeter low-density lesions in both kidneys are too small to characterize. No renal or ureteral calculi. No hydronephrosis. The bladder is decompressed. Stomach/Bowel: Stomach is within normal limits. Appendix appears normal. No evidence of bowel wall thickening, distention, or inflammatory changes. Vascular/Lymphatic: Mild noncalcified aortic atherosclerosis. No enlarged abdominal or pelvic lymph nodes. Reproductive: There are few small subcentimeter subserosal and intramural uterine fibroids. Dominant follicle in the right ovary, which is otherwise unremarkable. The left ovary is surgically absent. Other: Trace free fluid in the pelvis is likely physiologic. Tiny fat containing umbilical hernia. No pneumoperitoneum. 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 7 mm anterolisthesis at L5-S1 due to bilateral L5 pars defects. No acute fracture. IMPRESSION: 1.  No acute intra-abdominal process. 2. Small uterine fibroids. 3. 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 Electronically Signed   By: Titus Dubin M.D.   On: 09/21/2017 14:57   US Pelvic Doppler (torsion R/o Or Mass Arterial Flow)  Result Date: 09/21/2017 CLINICAL DATA:  Right lower quadrant abdominal discomfort for the past 3 days. Vaginal bleeding for the past 4 days. Onset of the most recentmenstrual period was September 18, 2017. The left ovary is reportedly surgically absent. EXAM: TRANSABDOMINAL AND TRANSVAGINAL ULTRASOUND OF PELVIS DOPPLER ULTRASOUND OF OVARIES TECHNIQUE: Both transabdominal and transvaginal ultrasound examinations of the pelvis were performed. Transabdominal technique was performed for global imaging of the pelvis including uterus, ovaries, adnexal regions, and pelvic cul-de-sac. It was necessary to proceed with endovaginal exam following the transabdominal exam to visualize the uterus, endometrium, ovaries, and adnexal structures. Color and duplex  Doppler ultrasound was utilized to evaluate blood flow to the ovaries. COMPARISON:  None in PACs FINDINGS: Uterus Measurements: 7.9 x 4.4 x 5.6 cm. No fibroids or other mass visualized. Endometrium Thickness: 6.7 mm.  No focal abnormality visualized. Right ovary Measurements: 4.5 x 2.5 x 2.8 cm. Normal appearance/no adnexal mass. Left ovary The left ovary was not visualized and has reportedly surgically absent. Pulsed Doppler evaluation of the right ovary demonstrates normal low-resistance arterial and venous waveforms. Other findings No abnormal free fluid. IMPRESSION: Normal appearance of the uterus, endometrium, and right ovary. The left ovary is reportedly surgically absent. Electronically Signed   By: David  Martinique M.D.   On: 09/21/2017 14:10     ____________________________________________   PROCEDURES  Procedure(s) performed: None Procedures Critical Care performed:  None ____________________________________________   INITIAL IMPRESSION / ASSESSMENT AND PLAN / ED COURSE  42 y.o. female with h/o ovarian cysts who presents for evaluation ofabdominal pain and vaginal bleeding x 3 days. patient is well-appearing, in no distress, hemodynamically  stable, abdomen is soft with tenderness to palpation on the right lower quadrant. Pelvic exam consistent with R adnexal tenderness. Ddx menstrual cramps vs ovarian cyst vs ovarian torsion vs pid vs appendicitis. Labs WNL. Will give toradol, zofran for symptoms relief. Will send for pelvic US with doppler.     _________________________ 3:16 PM on 09/21/2017 -----------------------------------------  ultrasound showing normal ovaries with no evidence of torsion or cysts. Patient continued to be tender in the right lower quadrant and therefore she was sent for CT abdomen and pelvis to rule out appendicitis. CT is negative for acute pathology. Does show uterine fibroids which could be contributing to patient's abnormal uterine bleed. She remained  hemodynamically stable. There is no signs of symptoms of severe anemia or indication for transfusion. I will refer her to OB/GYN for further management.   As part of my medical decision making, I reviewed the following data within the [place] notes reviewed and incorporated, Labs reviewed , Radiograph reviewed , Notes from prior ED visits and [place] Controlled Substance Database    Pertinent labs & imaging results that were available during my care of the patient were reviewed by me and considered in my medical decision making (see chart for details).    ____________________________________________   FINAL CLINICAL IMPRESSION(S) / ED DIAGNOSES  Final diagnoses:  RLQ abdominal pain  Abnormal uterine bleeding      NEW MEDICATIONS STARTED DURING THIS VISIT:  ED Discharge Orders    None       Note:  This document was prepared using Dragon voice recognition software and may include unintentional dictation errors.    Alfred Levins, [place], MD 09/21/17 1520

## 2017-09-21 NOTE — ED Triage Notes (Signed)
Pt with abd pain for three days.

## 2017-10-17 ENCOUNTER — Encounter: Payer: Self-pay | Admitting: Emergency Medicine

## 2017-10-17 ENCOUNTER — Emergency Department
Admission: EM | Admit: 2017-10-17 | Discharge: 2017-10-17 | Disposition: A | Discharge status: Home / Self Care | Payer: Self-pay | Attending: Emergency Medicine | Admitting: Emergency Medicine

## 2017-10-17 ENCOUNTER — Other Ambulatory Visit: Payer: Self-pay

## 2017-10-17 DIAGNOSIS — N946 Dysmenorrhea, unspecified: Secondary | ICD-10-CM | POA: Insufficient documentation

## 2017-10-17 DIAGNOSIS — F17210 Nicotine dependence, cigarettes, uncomplicated: Secondary | ICD-10-CM | POA: Insufficient documentation

## 2017-10-17 DIAGNOSIS — Z3202 Encounter for pregnancy test, result negative: Secondary | ICD-10-CM | POA: Insufficient documentation

## 2017-10-17 LAB — POCT PREGNANCY, URINE: Preg Test, Ur: NEGATIVE

## 2017-10-17 LAB — CBC WITH DIFFERENTIAL/PLATELET
BASOS ABS: 0.1 10*3/uL (ref 0–0.1)
BASOS PCT: 1 %
Eosinophils Absolute: 0.3 10*3/uL (ref 0–0.7)
Eosinophils Relative: 4 %
HEMATOCRIT: 35.9 % (ref 35.0–47.0)
Hemoglobin: 12.4 g/dL (ref 12.0–16.0)
LYMPHS ABS: 2.4 10*3/uL (ref 1.0–3.6)
LYMPHS PCT: 25 %
MCH: 32 pg (ref 26.0–34.0)
MCHC: 34.6 g/dL (ref 32.0–36.0)
MCV: 92.5 fL (ref 80.0–100.0)
MONOS PCT: 6 %
Monocytes Absolute: 0.6 10*3/uL (ref 0.2–0.9)
NEUTROS PCT: 64 %
Neutro Abs: 6 10*3/uL (ref 1.4–6.5)
PLATELETS: 220 10*3/uL (ref 150–440)
RBC: 3.88 MIL/uL (ref 3.80–5.20)
RDW: 13.7 % (ref 11.5–14.5)
WBC: 9.5 10*3/uL (ref 3.6–11.0)

## 2017-10-17 LAB — COMPREHENSIVE METABOLIC PANEL
ALT: 11 U/L — AB (ref 14–54)
AST: 22 U/L (ref 15–41)
Albumin: 4 g/dL (ref 3.5–5.0)
Alkaline Phosphatase: 66 U/L (ref 38–126)
Anion gap: 6 (ref 5–15)
BUN: 12 mg/dL (ref 6–20)
CHLORIDE: 105 mmol/L (ref 101–111)
CO2: 26 mmol/L (ref 22–32)
CREATININE: 0.46 mg/dL (ref 0.44–1.00)
Calcium: 9.2 mg/dL (ref 8.9–10.3)
GFR calc Af Amer: >60 mL/min (ref >60)
Glucose, Bld: 85 mg/dL (ref 65–99)
POTASSIUM: 3.9 mmol/L (ref 3.5–5.1)
SODIUM: 137 mmol/L (ref 135–145)
Total Bilirubin: 0.4 mg/dL (ref 0.3–1.2)
Total Protein: 7.5 g/dL (ref 6.5–8.1)

## 2017-10-17 LAB — URINALYSIS, COMPLETE (UACMP) WITH MICROSCOPIC
Bilirubin Urine: NEGATIVE
GLUCOSE, UA: NEGATIVE mg/dL
Ketones, ur: NEGATIVE mg/dL
Leukocytes, UA: NEGATIVE
Nitrite: NEGATIVE
PH: 7 (ref 5.0–8.0)
Protein, ur: NEGATIVE mg/dL
SPECIFIC GRAVITY, URINE: 1.01 (ref 1.005–1.030)

## 2017-10-17 LAB — URINE DRUG SCREEN, QUALITATIVE (ARMC ONLY)
Amphetamines, Ur Screen: NOT DETECTED
Barbiturates, Ur Screen: NOT DETECTED
Benzodiazepine, Ur Scrn: NOT DETECTED
Cannabinoid 50 Ng, Ur ~~LOC~~: POSITIVE — AB
Cocaine Metabolite,Ur ~~LOC~~: NOT DETECTED
MDMA (ECSTASY) UR SCREEN: NOT DETECTED
Methadone Scn, Ur: NOT DETECTED
OPIATE, UR SCREEN: NOT DETECTED
PHENCYCLIDINE (PCP) UR S: NOT DETECTED
Tricyclic, Ur Screen: NOT DETECTED

## 2017-10-17 LAB — PROTIME-INR
INR: 1
PROTHROMBIN TIME: 13.1 s (ref 11.4–15.2)

## 2017-10-17 MED ORDER — KETOROLAC TROMETHAMINE 30 MG/ML IJ SOLN
30.0000 mg | Freq: Once | INTRAMUSCULAR | Status: AC
  Administered 2017-10-17: 30 mg via INTRAVENOUS
  Filled 2017-10-17: qty 1

## 2017-10-17 MED ORDER — TRAMADOL HCL 50 MG PO TABS: 50.0000 mg | ORAL_TABLET | Freq: Four times a day (QID) | ORAL | 0 refills | Status: AC | PRN

## 2017-10-17 NOTE — ED Provider Notes (Addendum)
[facility] Emergency Department Provider Note  ____________________________________________   I have reviewed the triage vital signs and the nursing notes. Where available I have reviewed prior notes and, if possible and indicated, outside hospital notes.    HISTORY  Chief Complaint Vaginal Bleeding and Abdominal Cramping    HPI Barbara Arellano is a 42 y.o. female presents today complaining of vaginal bleeding "for a month", as well as abdominal cramping.  Patient had a recent significant workup for this on 20 March, when she was just having cramping it looks like, and at that time she had negative ultrasound and a CT scan which showed some mild fibroids.  Patient states that since that time she is been vaginally bleeding every day, and she has been having worsening menstrual cramps.  The patient denies any fever or chills, she is been having the symptoms she states for "a month".  She does not feel lightheaded.  She states she is using for 5 sometimes more sometimes less pads a day.  Patient has not yet followed up in no OB/GYN despite instructions because she does not have insurance.  Patient states that she is taking Tylenol and Motrin for this discomfort and it is helping a little bit but then it wears off.  Patient denies any fever, radiation of the pain.  She has had no numbness or weakness.  She denies any headache or stiff neck.  She denies any possibility of being pregnant.  She is missing her ovary and she had her other fallopian tube taken out.  She states last year she had very similar very painful periods and would sometimes last months on end.  She is not taking any kind of birth control.  She is only intermittently sexually active.  No vaginal discharge.    Past Medical History:  Diagnosis Date  . Cervical arthritis     There are no active problems to display for this patient.   Past Surgical History:  Procedure Laterality Date  . FRACTURE SURGERY     . OOPHORECTOMY      Prior to Admission medications   Medication Sig Start Date End Date Taking? Authorizing Provider  acyclovir (ZOVIRAX) 800 MG tablet Take 1 tablet (800 mg total) by mouth 5 (five) times daily. Start taking if you develop a rash on your chest in the area of pain before 03/26/17. 03/23/17   Carrie Mew, MD  amoxicillin (AMOXIL) 500 MG capsule Take 1 capsule (500 mg total) by mouth 3 (three) times daily. Patient not taking: Reported on 09/21/2017 07/16/17   Sable Feil, PA-C  amoxicillin-clavulanate (AUGMENTIN) 875-125 MG tablet Take 1 tablet 2 (two) times daily by mouth. Patient not taking: Reported on 09/21/2017 05/10/17   Cuthriell, Charline Bills, PA-C  azithromycin (ZITHROMAX Z-PAK) 250 MG tablet Take 2 tablets (500 mg) on  Day 1,  followed by 1 tablet (250 mg) once daily on Days 2 through 5. Patient not taking: Reported on 03/23/2017 06/11/16   Johnn Hai, PA-C  cyclobenzaprine (FLEXERIL) 10 MG tablet Take 1 tablet (10 mg total) by mouth 3 (three) times daily as needed. Patient not taking: Reported on 03/23/2017 12/27/16   Sable Feil, PA-C  guaiFENesin-codeine 100-10 MG/5ML syrup Take 5 mLs by mouth every 6 (six) hours as needed for cough. Patient not taking: Reported on 09/21/2017 07/25/16   Harvest Dark, MD  ibuprofen (ADVIL,MOTRIN) 600 MG tablet Take 1 tablet (600 mg total) by mouth every 8 (eight) hours as needed. Patient  not taking: Reported on 10/17/2017 01/17/17   Sable Feil, PA-C  ibuprofen (ADVIL,MOTRIN) 800 MG tablet Take 1 tablet (800 mg total) by mouth every 8 (eight) hours as needed. Patient not taking: Reported on 10/17/2017 07/16/17   Sable Feil, PA-C  magic mouthwash w/lidocaine SOLN Take 5 mLs by mouth 4 (four) times daily. Oral swish and swallow Patient not taking: Reported on 03/23/2017 01/17/17   Sable Feil, PA-C  meloxicam (MOBIC) 15 MG tablet Take 1 tablet (15 mg total) daily by mouth. Patient not taking: Reported on  10/17/2017 05/10/17   Cuthriell, Charline Bills, PA-C  methocarbamol (ROBAXIN) 500 MG tablet Take 1 tablet (500 mg total) 4 (four) times daily by mouth. Patient not taking: Reported on 10/17/2017 05/10/17   Cuthriell, Charline Bills, PA-C  methylPREDNISolone (MEDROL DOSEPAK) 4 MG TBPK tablet Take Tapered dose as directed Patient not taking: Reported on 03/23/2017 12/27/16   Sable Feil, PA-C  promethazine (PHENERGAN) 25 MG tablet Take 1 tablet (25 mg total) by mouth every 6 (six) hours as needed for nausea or vomiting. Patient not taking: Reported on 03/23/2017 07/25/16   Harvest Dark, MD  traMADol (ULTRAM) 50 MG tablet Take 1 tablet (50 mg total) by mouth every 6 (six) hours as needed for moderate pain. Patient not taking: Reported on 03/23/2017 12/27/16   Sable Feil, PA-C  traMADol (ULTRAM) 50 MG tablet Take 1 tablet (50 mg total) by mouth every 6 (six) hours as needed for moderate pain. Patient not taking: Reported on 10/17/2017 07/16/17   Sable Feil, PA-C    Allergies Patient has no known allergies.  No family history on file.  Social History Social History   Tobacco Use  . Smoking status: Current Every Day Smoker    Packs/day: 0.50    Types: Cigarettes  . Smokeless tobacco: Never Used  Substance Use Topics  . Alcohol use: No  . Drug use: No    Review of Systems Constitutional: No fever/chills Eyes: No visual changes. ENT: No sore throat. No stiff neck no neck pain Cardiovascular: Denies chest pain. Respiratory: Denies shortness of breath. Gastrointestinal:   no vomiting.  No diarrhea.  No constipation. Genitourinary: Negative for dysuria. Musculoskeletal: Negative lower extremity swelling Skin: Negative for rash. Neurological: Negative for severe headaches, focal weakness or numbness.   ____________________________________________   PHYSICAL EXAM:  VITAL SIGNS: ED Triage Vitals  Enc Vitals Group     BP 10/17/17 0853 116/60     Pulse --      Resp 10/17/17  0853 16     Temp 10/17/17 0853 98.2 F (36.8 C)     Temp Source 10/17/17 0853 Oral     SpO2 10/17/17 0853 97 %     Weight 10/17/17 0854 152 lb (68.9 kg)     Height 10/17/17 0854 5\' 4"  (1.626 m)     Head Circumference --      Peak Flow --      Pain Score 10/17/17 0854 10     Pain Loc --      Pain Edu? --      Excl. in [place]? --     Constitutional: Alert and oriented.  Appears uncomfortable but nontoxic Eyes: Conjunctivae are normal Head: Atraumatic HEENT: No congestion/rhinnorhea. Mucous membranes are moist.  Oropharynx non-erythematous Neck:   Nontender with no meningismus, no masses, no stridor Cardiovascular: Normal rate, regular rhythm. Grossly normal heart sounds.  Good peripheral circulation. Respiratory: Normal respiratory effort.  No retractions. Lungs CTAB.  Abdominal: Soft and nonsurgical very slight discomfort in the suprapubic region. No distention. No guarding no rebound Back:  There is no focal tenderness or step off.  there is no midline tenderness there are no lesions noted. there is no CVA tenderness Musculoskeletal: No lower extremity tenderness, no upper extremity tenderness. No joint effusions, no DVT signs strong distal pulses no edema Neurologic:  Normal speech and language. No gross focal neurologic deficits are appreciated.  Skin:  Skin is warm, dry and intact. No rash noted. Psychiatric: Mood and affect are normal. Speech and behavior are normal.  ____________________________________________   LABS (all labs ordered are listed, but only abnormal results are displayed)  Labs Reviewed  COMPREHENSIVE METABOLIC PANEL  CBC WITH DIFFERENTIAL/PLATELET  PROTIME-INR  URINALYSIS, COMPLETE (UACMP) WITH MICROSCOPIC  URINE DRUG SCREEN, QUALITATIVE ([facility] ONLY)  CBG MONITORING, ED    Pertinent labs  results that were available during my care of the patient were reviewed by me and considered in my medical decision making (see chart for  details). ____________________________________________  EKG  I personally interpreted any EKGs ordered by me or triage  ____________________________________________  RADIOLOGY  Pertinent labs & imaging results that were available during my care of the patient were reviewed by me and considered in my medical decision making (see chart for details). If possible, patient and/or family made aware of any abnormal findings.  No results found. ____________________________________________    PROCEDURES  Procedure(s) performed: None  Procedures  Critical Care performed: None  ____________________________________________   INITIAL IMPRESSION / ASSESSMENT AND PLAN / ED COURSE  Pertinent labs & imaging results that were available during my care of the patient were reviewed by me and considered in my medical decision making (see chart for details).  Patient here with persistent recurrent abdominal cramping and vaginal bleeding off and on for several years in fact, most likely menopausal.  Does have a history also of fibroids.  Denies pregnancy we will check urine pregnancy and will give her pain medication we will check and ensure that she is not anemic we will check basic blood work as a precaution, vital signs and exam are quite reassuring.  I have explained to the patient most likely this is going to be something that needs outpatient OB/GYN follow-up, vaginal bleeding off and on for a year with cramping with a very reassuring workup for the same 3 weeks ago is not likely to be something that is going to mandate admission to the hospital or acute surgical intervention but we certainly will check and make sure everything is stable if possible and get her feeling better.  ----------------------------------------- 11:15 AM on 10/17/2017 -----------------------------------------  No acute distress I did discuss with her a possible pelvic exam, declines at this time, feels that there is no  change from the last time she was here and she does not wish another pelvic exam in the emergency department.  Hemoglobin is stable vital signs are stable, and she is eager to go home.  We will send her home with pain control and very strongly stressed outpatient OB/GYN follow-up.    ____________________________________________   FINAL CLINICAL IMPRESSION(S) / ED DIAGNOSES  Final diagnoses:  None      This chart was dictated using voice recognition software.  Despite best efforts to proofread,  errors can occur which can change meaning.      Schuyler Amor, MD 10/17/17 0814    Schuyler Amor, MD 10/17/17 1116

## 2017-10-17 NOTE — ED Triage Notes (Signed)
Pt here with c/o vaginal bleeding for over a month now with clots, lower abdominal cramping as well, states left ovary was removed 4 years ago, has not been to OBGYN recently.

## 2017-10-17 NOTE — ED Notes (Signed)
IV removed.  Site okay

## 2017-10-17 NOTE — ED Notes (Signed)
First Nurse Note:  Patient complaining of abdominal pain, walking bent over.  Placed in [place].  States she is having abdominal pain and vaginal bleeding.  Seen here recently for same.

## 2017-10-18 ENCOUNTER — Ambulatory Visit (INDEPENDENT_AMBULATORY_CARE_PROVIDER_SITE_OTHER): Payer: Self-pay | Admitting: Obstetrics and Gynecology

## 2017-10-18 ENCOUNTER — Encounter: Payer: Self-pay | Admitting: Obstetrics and Gynecology

## 2017-10-18 VITALS — BP 118/66 | Ht 64.0 in | Wt 158.0 lb

## 2017-10-18 DIAGNOSIS — Z1239 Encounter for other screening for malignant neoplasm of breast: Secondary | ICD-10-CM

## 2017-10-18 DIAGNOSIS — R109 Unspecified abdominal pain: Secondary | ICD-10-CM

## 2017-10-18 DIAGNOSIS — Z1231 Encounter for screening mammogram for malignant neoplasm of breast: Secondary | ICD-10-CM

## 2017-10-18 DIAGNOSIS — Z124 Encounter for screening for malignant neoplasm of cervix: Secondary | ICD-10-CM

## 2017-10-18 NOTE — Progress Notes (Signed)
Patient ID: Barbara Arellano, female   DOB: 12-Sep-1975, 42 y.o.   MRN: [id number]  Reason for Consult: Follow-up (ER follow up dysmenorrhea)   Referred by No ref. provider found  Subjective:     HPI:  Barbara Arellano is a 42 y.o. female she is following up today for 2 recent ER visits.  She has complaints of vaginal bleeding and pelvic pain.  Patient reports that the pain is on her left side.  Sometimes the pain radiates across her abdomen.  Is generally always in the lower aspect.  She reports that she has been taking ibuprofen and Tylenol for the pain but it has not given her any relief.  She reports that she has had difficulty sleeping because of the pain and nausea she states that the pain is worse with bowel movements.  She says that she has been having looser stools but that the pain is worse with constipation.  She states that sometimes the pain will actually stop her from having a bowel movement because of the intensity.  She says that she is more comfortable hunched over her in a ball because of the left-sided pain.  Patient also reports that she has been having abnormal uterine bleeding.  Patient reports that she has had bleeding all of March and April.  She reports that there is blood and mucus that she sees when she wipes.  Patient reports that she is using 6-7 pads a day she says that the pads are full and that sometimes she has accidents overnight and during the day she reports that she is passing clots the size of 1/3 cup.  She has had no previous evaluation for this abnormal bleeding which she says has persisted for over a year.  She has not had any treatment of her abnormal bleeding.  Patient states that she entered menarche at the age of 23 she states that she has a history of irregular periods on and off throughout her life.  Patient reports that her bleeding and irregularity of periods has been worse for the last 2 years she reports that she has approximately 2 periods a month with  only 1-2 weeks between the periods.  Sometimes she has bleeding that lasted an entire month.  Patient had a recent CT and pelvic ultrasound in March through the emergency department.  The pelvic ultrasound showed a normal-sized uterus and right ovary the left ovary really ovary with surgical the absent which corresponds to the patient's history.  The ultrasound did not report any fibroids or masses patient's recent abdominal CT did suggest that small uterine fibroids may be present approximately 1 cm in size.   Past Medical History:  Diagnosis Date  . Cervical arthritis    Family History  Problem Relation Age of Onset  . Heart disease Mother   . Cancer Father 34       bone marrow  . Heart disease Father    Past Surgical History:  Procedure Laterality Date  . BILATERAL SALPINGECTOMY     age 76  . FRACTURE SURGERY    . OOPHORECTOMY Left 2015   ruptured cyst    Short Social History:  Social History   Tobacco Use  . Smoking status: Current Every Day Smoker    Packs/day: 0.50    Types: Cigarettes  . Smokeless tobacco: Never Used  Substance Use Topics  . Alcohol use: No    No Known Allergies  Current Outpatient Medications  Medication Sig Dispense Refill  .  ibuprofen (ADVIL,MOTRIN) 800 MG tablet Take 1 tablet (800 mg total) by mouth every 8 (eight) hours as needed. 30 tablet 0  . acyclovir (ZOVIRAX) 800 MG tablet Take 1 tablet (800 mg total) by mouth 5 (five) times daily. Start taking if you develop a rash on your chest in the area of pain before 03/26/17. (Patient not taking: Reported on 10/18/2017) 35 tablet 0  . traMADol (ULTRAM) 50 MG tablet Take 1 tablet (50 mg total) by mouth every 6 (six) hours as needed. (Patient not taking: Reported on 10/18/2017) 5 tablet 0   No current facility-administered medications for this visit.     Review of Systems  Constitutional: Positive for fatigue. Negative for chills, fever and unexpected weight change.  HENT: Negative for trouble  swallowing.  Eyes: Negative for loss of vision.  Respiratory: Negative for cough, shortness of breath and wheezing.  Cardiovascular: Negative for chest pain, leg swelling, palpitations and syncope.  GI: Positive for abdominal pain and nausea. Negative for blood in stool, diarrhea and vomiting.  GU: Negative for difficulty urinating, dysuria, frequency and hematuria.  Musculoskeletal: Positive for joint pain. Negative for back pain and leg pain.  Skin: Negative for rash.  Neurological: Positive for headaches. Negative for dizziness, light-headedness, numbness and seizures.  Hematologic: Positive for bruises/bleeds easily.  Psychiatric: Positive for depressed mood. Negative for behavioral problem, confusion and sleep disturbance.        Objective:  Objective   Vitals:   10/18/17 1407  BP: 118/66  Weight: 158 lb (71.7 kg)  Height: 5\' 4"  (1.626 m)   Body mass index is 27.12 kg/m.  Physical Exam  Constitutional: She is oriented to person, place, and time. She appears well-developed and well-nourished.  HENT:  Head: Normocephalic and atraumatic.  Eyes: EOM are normal.  Cardiovascular: Normal rate, regular rhythm and normal heart sounds.  Pulmonary/Chest: Effort normal and breath sounds normal.  Abdominal: Soft. She exhibits no distension and no mass. There is tenderness. There is guarding. There is no rebound.  Genitourinary: Uterus normal. There is no rash, tenderness, lesion or injury on the right labia. There is no rash, tenderness, lesion or injury on the left labia. Uterus is not enlarged, not fixed and not tender. Cervix exhibits discharge. Cervix exhibits no motion tenderness and no friability. Right adnexum displays no mass, no tenderness and no fullness. Left adnexum displays tenderness. Left adnexum displays no mass and no fullness. No erythema, tenderness or bleeding in the vagina. No foreign body in the vagina. No signs of injury around the vagina. No vaginal discharge found.    Genitourinary Comments: Small amount of blood in the cervical vault. Slow bleeding, bright red. No clots.   Neurological: She is alert and oriented to person, place, and time.  Skin: Skin is warm and dry.  Psychiatric: She has a normal mood and affect. Her behavior is normal. Judgment and thought content normal.  Nursing note and vitals reviewed.        Assessment/Plan:    Uterine fibroids- small in size, intramural ans subserosal location- It is not likely that these fibroids given their size ans position would be the source of any abdominal pain of the heavy bleeding that the patient is having.  Abnormal uterine bleeding fifth-patient has had steady hemoglobins that which were not anemic over the last year.  The bleeding pattern that the patient describes does not correlate with her stable hemoglobin levels.  Discussed management options with the patient including progesterone therapy, Nexplanon, IUD, endometrial ablation  and hysterectomy. Encouraged patient to start with the least medically invasive therapy which would be hormone therapy.  Discussed that the patient can follow-up with the health department and receive a Nexplanon at a lower cost the patient was interested in this option.  Discussed with the patient that if her bleeding was not to improve that she would need further evaluation and biopsy of the endometrial lining to exclude malignancy.   Left-sided abdominal pain worse with bowel movements.  Patient's left ovary and fallopian tube is surgically absent.  Unlikely gynecological source of pain.  Will refer patient to gastroenterology.  Patient was given resources by Denyse Dago for obtaining care at lower cost since she is paying out of pocket for medical care and does not currently have insurance.   Health maintenance: Patient was given information about the [facility] program so that she can obtain free cervical and breast cancer screening.  Adrian Prows MD [facility],  [facility] 10/18/17 2:20 PM

## 2017-10-18 NOTE — Patient Instructions (Signed)

## 2017-11-01 ENCOUNTER — Other Ambulatory Visit: Payer: Self-pay

## 2017-11-01 ENCOUNTER — Encounter: Payer: Self-pay | Admitting: Emergency Medicine

## 2017-11-01 ENCOUNTER — Emergency Department
Admission: EM | Admit: 2017-11-01 | Discharge: 2017-11-01 | Disposition: A | Discharge status: Home / Self Care | Payer: Self-pay | Attending: Emergency Medicine | Admitting: Emergency Medicine

## 2017-11-01 DIAGNOSIS — F17210 Nicotine dependence, cigarettes, uncomplicated: Secondary | ICD-10-CM | POA: Insufficient documentation

## 2017-11-01 DIAGNOSIS — M62830 Muscle spasm of back: Secondary | ICD-10-CM | POA: Insufficient documentation

## 2017-11-01 HISTORY — DX: Leiomyoma of uterus, unspecified: D25.9

## 2017-11-01 LAB — URINALYSIS, COMPLETE (UACMP) WITH MICROSCOPIC
Bacteria, UA: NONE SEEN
Bilirubin Urine: NEGATIVE
Glucose, UA: NEGATIVE mg/dL
Hgb urine dipstick: NEGATIVE
Ketones, ur: NEGATIVE mg/dL
Nitrite: NEGATIVE
Protein, ur: NEGATIVE mg/dL
Specific Gravity, Urine: 1.02 (ref 1.005–1.030)
pH: 6 (ref 5.0–8.0)

## 2017-11-01 LAB — POCT PREGNANCY, URINE: Preg Test, Ur: NEGATIVE

## 2017-11-01 MED ORDER — MELOXICAM 15 MG PO TABS: 15.0000 mg | ORAL_TABLET | Freq: Every day | ORAL | 1 refills | Status: AC

## 2017-11-01 MED ORDER — KETOROLAC TROMETHAMINE 30 MG/ML IJ SOLN
30.0000 mg | Freq: Once | INTRAMUSCULAR | Status: AC
  Administered 2017-11-01: 30 mg via INTRAMUSCULAR
  Filled 2017-11-01: qty 1

## 2017-11-01 MED ORDER — PREDNISONE 50 MG PO TABS: ORAL_TABLET | ORAL | 0 refills | Status: DC

## 2017-11-01 MED ORDER — ORPHENADRINE CITRATE 30 MG/ML IJ SOLN
60.0000 mg | Freq: Two times a day (BID) | INTRAMUSCULAR | Status: DC
  Administered 2017-11-01: 60 mg via INTRAMUSCULAR
  Filled 2017-11-01 (×4): qty 2

## 2017-11-01 MED ORDER — METHYLPREDNISOLONE SODIUM SUCC 125 MG IJ SOLR
125.0000 mg | Freq: Once | INTRAMUSCULAR | Status: AC
  Administered 2017-11-01: 125 mg via INTRAMUSCULAR
  Filled 2017-11-01: qty 2

## 2017-11-01 NOTE — ED Notes (Signed)
See triage note  Presents with pain and some swelling to right mid back/flank area  Denies any injury or urinary sxs  Area is tender on palpation

## 2017-11-01 NOTE — ED Provider Notes (Signed)
[facility] Emergency Department Provider Note  ____________________________________________  Time seen: Approximately 11:47 PM  I have reviewed the triage vital signs and the nursing notes.   HISTORY  Chief Complaint Back Pain    HPI Barbara Arellano is a 42 y.o. female presents to the emergency department with muscle spasms of the paraspinal muscles of the thoracic spine.  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  She denies falls or traumas.  She reports that affected region is tender to palpation.  She denies dysuria, hematuria and increased urinary frequency.  She denies fever and chills.  No prior history of nephrolithiasis.  No alleviating measures of been attempted.  She currently rates her pain at 8 out of 10 in intensity and describes it as tight and aching.  Patient is secondarily seeking pain management for chronic left shoulder pain.   Past Medical History:  Diagnosis Date  . Cervical arthritis   . Fibroid, uterine     There are no active problems to display for this patient.   Past Surgical History:  Procedure Laterality Date  . BILATERAL SALPINGECTOMY     age 60  . FRACTURE SURGERY    . OOPHORECTOMY Left 2015   ruptured cyst    Prior to Admission medications   Medication Sig Start Date End Date Taking? Authorizing Provider  acyclovir (ZOVIRAX) 800 MG tablet Take 1 tablet (800 mg total) by mouth 5 (five) times daily. Start taking if you develop a rash on your chest in the area of pain before 03/26/17. Patient not taking: Reported on 10/18/2017 03/23/17   Carrie Mew, MD  ibuprofen (ADVIL,MOTRIN) 800 MG tablet Take 1 tablet (800 mg total) by mouth every 8 (eight) hours as needed. 07/16/17   Sable Feil, PA-C  meloxicam (MOBIC) 15 MG tablet Take 1 tablet (15 mg total) by mouth daily for 7 days. 11/01/17 11/08/17  Lannie Fields, PA-C  predniSONE (DELTASONE) 50 MG tablet Take one 50 mg tablet once  daily for the next 5 days. 11/01/17   Lannie Fields, PA-C  traMADol (ULTRAM) 50 MG tablet Take 1 tablet (50 mg total) by mouth every 6 (six) hours as needed. Patient not taking: Reported on 10/18/2017 10/17/17 10/17/18  Schuyler Amor, MD    Allergies Patient has no known allergies.  Family History  Problem Relation Age of Onset  . Heart disease Mother   . Cancer Father 55       bone marrow  . Heart disease Father     Social History Social History   Tobacco Use  . Smoking status: Current Every Day Smoker    Packs/day: 0.50    Types: Cigarettes  . Smokeless tobacco: Never Used  Substance Use Topics  . Alcohol use: No  . Drug use: Yes    Types: Marijuana     Review of Systems  Constitutional: No fever/chills Eyes: No visual changes. No discharge ENT: No upper respiratory complaints. Cardiovascular: no chest pain. Respiratory: no cough. No SOB. Gastrointestinal: No abdominal pain.  No nausea, no vomiting.  No diarrhea.  No constipation. Musculoskeletal:   Patient has muscle spasm and chronic left shoulder pain. Skin: Negative for rash, abrasions, lacerations, ecchymosis. Neurological: Negative for headaches, focal weakness or numbness.   ____________________________________________   PHYSICAL EXAM:  VITAL SIGNS: ED Triage Vitals  Enc Vitals Group     BP 11/01/17 1726 127/88     Pulse Rate 11/01/17 1726 99  Resp 11/01/17 1726 18     Temp 11/01/17 1726 99.4 F (37.4 C)     Temp Source 11/01/17 1726 Oral     SpO2 11/01/17 1726 96 %     Weight 11/01/17 1727 148 lb (67.1 kg)     Height 11/01/17 1727 5\' 4"  (1.626 m)     Head Circumference --      Peak Flow --      Pain Score 11/01/17 1732 10     Pain Loc --      Pain Edu? --      Excl. in [place]? --      Constitutional: Alert and oriented. Well appearing and in no acute distress. Eyes: Conjunctivae are normal. PERRL. EOMI. Head: Atraumatic. Cardiovascular: Normal rate, regular rhythm. Normal S1 and S2.   Good peripheral circulation. Respiratory: Normal respiratory effort without tachypnea or retractions. Lungs CTAB. Good air entry to the bases with no decreased or absent breath sounds. Musculoskeletal: Patient has reproducible tenderness to palpation along the paraspinal muscles along the thoracic spine. Neurologic:  Normal speech and language. No gross focal neurologic deficits are appreciated.  Skin:  Skin is warm, dry and intact. No rash noted. Psychiatric: Mood and affect are normal. Speech and behavior are normal. Patient exhibits appropriate insight and judgement.   ____________________________________________   LABS (all labs ordered are listed, but only abnormal results are displayed)  Labs Reviewed  URINALYSIS, COMPLETE (UACMP) WITH MICROSCOPIC - Abnormal; Notable for the following components:      Result Value   Color, Urine YELLOW (*)    APPearance HAZY (*)    Leukocytes, UA TRACE (*)    All other components within normal limits  POC URINE PREG, ED  POCT PREGNANCY, URINE   ____________________________________________  EKG   ____________________________________________  RADIOLOGY  No results found.  ____________________________________________    PROCEDURES  Procedure(s) performed:    Procedures    Medications  ketorolac (TORADOL) 30 MG/ML injection 30 mg (30 mg Intramuscular Given 11/01/17 1900)  methylPREDNISolone sodium succinate (SOLU-MEDROL) 125 mg/2 mL injection 125 mg (125 mg Intramuscular Given 11/01/17 2018)     ____________________________________________   INITIAL IMPRESSION / ASSESSMENT AND PLAN / ED COURSE  Pertinent labs & imaging results that were available during my care of the patient were reviewed by me and considered in my medical decision making (see chart for details).  Review of the [place] CSRS was performed in accordance of the [place] prior to dispensing any controlled drugs.    Assessment and plan Muscle spasm Patient presents  to the emergency department with muscle spasms of the paraspinal muscles along the right side of the thoracic spine. Differential diagnosis included muscle spasm versus cystitis.  Urinalysis was reassuring.  Patient was treated empirically with Norflex and Toradol in the emergency department and she reported that her pain improved.  Patient was discharged with a 5-day course of prednisone as well as meloxicam.  Prednisone was selected as patient has tried has associated chronic left shoulder pain.  Patient was advised to start meloxicam after prednisone completion.      ____________________________________________  FINAL CLINICAL IMPRESSION(S) / ED DIAGNOSES  Final diagnoses:  Muscle spasm of back      NEW MEDICATIONS STARTED DURING THIS VISIT:  ED Discharge Orders        Ordered    predniSONE (DELTASONE) 50 MG tablet     11/01/17 2008    meloxicam (MOBIC) 15 MG tablet  Daily     11/01/17 2008  This chart was dictated using voice recognition software/Dragon. Despite best efforts to proofread, errors can occur which can change the meaning. Any change was purely unintentional.    Lannie Fields, PA-C 11/01/17 2356    Eula Listen, MD 11/10/17 1350

## 2017-11-01 NOTE — ED Triage Notes (Signed)
Pt to ED c/o right sided upper back pain since waking up this morning, non radiating, sharp pain, denies urinary changes, denies known injury.

## 2017-11-01 NOTE — ED Notes (Signed)
Medication to be sent from pharmacy

## 2017-11-23 ENCOUNTER — Ambulatory Visit
Admission: RE | Admit: 2017-11-23 | Discharge: 2017-11-23 | Disposition: A | Discharge status: Home / Self Care | Payer: Self-pay | Source: Ambulatory Visit | Attending: Oncology | Admitting: Oncology

## 2017-11-23 ENCOUNTER — Ambulatory Visit: Payer: Self-pay | Attending: Oncology | Admitting: *Deleted

## 2017-11-23 ENCOUNTER — Other Ambulatory Visit: Payer: Self-pay

## 2017-11-23 VITALS — BP 105/64 | HR 66 | Temp 96.7°F | Resp 18 | Ht 65.0 in | Wt 158.0 lb

## 2017-11-23 DIAGNOSIS — Z0000 Encounter for general adult medical examination without abnormal findings: Secondary | ICD-10-CM

## 2017-11-23 NOTE — Patient Instructions (Signed)
Gave patient hand-out, Women Staying Healthy, Active and Well from [facility], with education on breast health, pap smears, heart and colon health. 

## 2017-11-23 NOTE — Progress Notes (Signed)
  Subjective:     Patient ID: Barbara Arellano, female   DOB: Dec 30, 1975, 42 y.o.   MRN: [id number]  HPI   Review of Systems     Objective:   Physical Exam  Pulmonary/Chest: Right breast exhibits nipple discharge. Right breast exhibits no inverted nipple, no mass, no skin change and no tenderness. Left breast exhibits nipple discharge. Left breast exhibits no inverted nipple, no mass, no skin change and no tenderness.         Assessment:     42 year old White female referred to [facility] by Adrian Prows, MD for clinical breast exam, pap and mammogram.  Clinical breast exam with scattered fibroglandular like tissue.  Patient also states a 10 plus year history of bilateral green / white nipple discharge.  States she is a smoker, but is trying to quit.  States she is down from a a pack a day to 1/2 pack a day.  Taught self breast awareness. States she is currently bleeding and has been bleeding irregularly since March.  Asked if she disclosed this information to Dr. Gilman Schmidt.  She states she is supposed to decide what she wants to do.  States she is to choose, from ablation, implant, or IUD.  Number given to the [facility] Department for birth control.  Since she is bleeding today, we have omitted her pap to a different time.  She is to call us back when she is not bleeding and we can work her in for her pap smear.  She is agreeable to the plan.  Patient has been screened for eligibility.  She does not have any insurance, Medicare or Medicaid.  She also meets financial eligibility.  Hand-out given on the Affordable Care Act.    Plan:     Screening mammogram ordered.  Will follow-up per [facility] protocol.

## 2017-12-05 ENCOUNTER — Ambulatory Visit (INDEPENDENT_AMBULATORY_CARE_PROVIDER_SITE_OTHER): Payer: Self-pay | Admitting: Gastroenterology

## 2017-12-05 ENCOUNTER — Encounter: Payer: Self-pay | Admitting: Gastroenterology

## 2017-12-05 ENCOUNTER — Other Ambulatory Visit
Admission: RE | Admit: 2017-12-05 | Discharge: 2017-12-05 | Disposition: A | Discharge status: Home / Self Care | Payer: Self-pay | Source: Ambulatory Visit | Attending: Gastroenterology | Admitting: Gastroenterology

## 2017-12-05 VITALS — BP 100/65 | HR 75 | Resp 17 | Ht 64.0 in | Wt 157.8 lb

## 2017-12-05 DIAGNOSIS — N939 Abnormal uterine and vaginal bleeding, unspecified: Secondary | ICD-10-CM | POA: Insufficient documentation

## 2017-12-05 DIAGNOSIS — D500 Iron deficiency anemia secondary to blood loss (chronic): Secondary | ICD-10-CM | POA: Insufficient documentation

## 2017-12-05 DIAGNOSIS — R1032 Left lower quadrant pain: Secondary | ICD-10-CM

## 2017-12-05 LAB — CBC
HCT: 32.8 % — ABNORMAL LOW (ref 35.0–47.0)
Hemoglobin: 11.5 g/dL — ABNORMAL LOW (ref 12.0–16.0)
MCH: 32.5 pg (ref 26.0–34.0)
MCHC: 35.1 g/dL (ref 32.0–36.0)
MCV: 92.4 fL (ref 80.0–100.0)
PLATELETS: 225 10*3/uL (ref 150–440)
RBC: 3.55 MIL/uL — AB (ref 3.80–5.20)
RDW: 12.9 % (ref 11.5–14.5)
WBC: 8.1 10*3/uL (ref 3.6–11.0)

## 2017-12-05 LAB — IRON AND TIBC
IRON: 33 ug/dL (ref 28–170)
Saturation Ratios: 9 % — ABNORMAL LOW (ref 10.4–31.8)
TIBC: 382 ug/dL (ref 250–450)
UIBC: 349 ug/dL

## 2017-12-05 LAB — FOLATE: Folate: 13.8 ng/mL (ref >5.9)

## 2017-12-05 LAB — FERRITIN: Ferritin: 5 ng/mL — ABNORMAL LOW (ref 11–307)

## 2017-12-05 LAB — VITAMIN B12: VITAMIN B 12: 215 pg/mL (ref 180–914)

## 2017-12-05 MED ORDER — CIPROFLOXACIN HCL 500 MG PO TABS
500.0000 mg | ORAL_TABLET | Freq: Two times a day (BID) | ORAL | 0 refills | Status: AC
Start: 2017-12-05 — End: 2017-12-19

## 2017-12-05 MED ORDER — METRONIDAZOLE 500 MG PO TABS: 500.0000 mg | ORAL_TABLET | Freq: Two times a day (BID) | ORAL | 0 refills | Status: AC

## 2017-12-05 NOTE — Progress Notes (Signed)
Cephas Darby, MD [address]  [place]  [place], [place] [address]  Main: [phone]  Fax: [phone]    Gastroenterology Consultation  Referring Provider:     Homero Arellano, * Primary Care Physician:  Patient, No Pcp Per Primary Gastroenterologist:  Dr. Cephas Darby Reason for Consultation:     Left lower quadrant pain/left groin pain        HPI:   Barbara Arellano is a 42 y.o. female referred by Dr. Gilman Arellano for consultation & management of three-month history of left-sided abdominal pain. Patient had history of left oophorectomy in 2015. She reports that since beginning of March 2019 she has been experiencing left-sided sharp/stabbing pain associated with vaginal bleeding. She had pelvic exam including imaging studies and was found to have small uterine fibroids. She is closely followed by OB/GYN for vaginal bleeding. She went to health department today for Nexplanon, but she was rejected as the indication was not for birth control. She has been having continuous vaginal bleeding since beginning of March and has been associated with blood clots, changing pads up to4 times daily. She feels tired, fatigued. She reports occasional constipation other than that has regular bowel movements, one to 2 times daily without straining. She denies rectal bleeding or blood mixed with stool She denies weight loss She smokes cigarettes half pack per day  NSAIDs: taking ibuprofen 200 mg 4 pills daily alternating with Tylenol  Antiplts/Anticoagulants/Anti thrombotics: none  GI Procedures: none She denies family history of GI malignancy  Past Medical History:  Diagnosis Date  . Cervical arthritis   . Fibroid, uterine     Past Surgical History:  Procedure Laterality Date  . BILATERAL SALPINGECTOMY     age 17  . FRACTURE SURGERY    . OOPHORECTOMY Left 2015   ruptured cyst     Current Outpatient Medications:  .  traMADol (ULTRAM) 50 MG tablet, Take 1 tablet (50 mg  total) by mouth every 6 (six) hours as needed., Disp: 5 tablet, Rfl: 0 .  acyclovir (ZOVIRAX) 800 MG tablet, Take 1 tablet (800 mg total) by mouth 5 (five) times daily. Start taking if you develop a rash on your chest in the area of pain before 03/26/17. (Patient not taking: Reported on 10/18/2017), Disp: 35 tablet, Rfl: 0 .  ciprofloxacin (CIPRO) 500 MG tablet, Take 1 tablet (500 mg total) by mouth 2 (two) times daily for 14 days., Disp: 28 tablet, Rfl: 0 .  ibuprofen (ADVIL,MOTRIN) 800 MG tablet, Take 1 tablet (800 mg total) by mouth every 8 (eight) hours as needed. (Patient not taking: Reported on 12/05/2017), Disp: 30 tablet, Rfl: 0 .  metroNIDAZOLE (FLAGYL) 500 MG tablet, Take 1 tablet (500 mg total) by mouth 2 (two) times daily for 14 days., Disp: 28 tablet, Rfl: 0 .  predniSONE (DELTASONE) 50 MG tablet, Take one 50 mg tablet once daily for the next 5 days. (Patient not taking: Reported on 12/05/2017), Disp: 5 tablet, Rfl: 0   Family History  Problem Relation Age of Onset  . Heart disease Mother   . Cancer Father 36       bone marrow  . Heart disease Father   . Breast cancer Maternal Aunt        late 54's     Social History   Tobacco Use  . Smoking status: Current Every Day Smoker    Packs/day: 0.50    Types: Cigarettes  . Smokeless tobacco: Never Used  Substance Use  Topics  . Alcohol use: No  . Drug use: Yes    Types: Marijuana    Allergies as of 12/05/2017  . (No Known Allergies)    Review of Systems:    All systems reviewed and negative except where noted in HPI.   Physical Exam:  BP 100/65 (BP Location: Left Arm, Patient Position: Sitting)   Pulse 75   Resp 17   Ht 5\' 4"  (1.626 m)   Wt 157 lb 12.8 oz (71.6 kg)   BMI 27.09 kg/m  No LMP recorded.  General:   Alert,  Well-developed, well-nourished, pleasant and cooperative in NAD Head:  Normocephalic and atraumatic. Eyes:  Sclera clear, no icterus.   Conjunctiva pink. Ears:  Normal auditory acuity. Nose:  No  deformity, discharge, or lesions. Mouth:  No deformity or lesions,oropharynx pink & moist. Neck:  Supple; no masses or thyromegaly. Lungs:  Respirations even and unlabored.  Clear throughout to auscultation.   No wheezes, crackles, or rhonchi. No acute distress. Heart:  Regular rate and rhythm; no murmurs, clicks, rubs, or gallops. Abdomen:  Normal bowel sounds. Soft, mild LLQ tenderness and non-distended without masses, hepatosplenomegaly or hernias noted.  No guarding or rebound tenderness.   Rectal: Not performed Msk:  Symmetrical without gross deformities. Good, equal movement & strength bilaterally. Pulses:  Normal pulses noted. Extremities:  No clubbing or edema.  No cyanosis. Neurologic:  Alert and oriented x3;  grossly normal neurologically. Skin:  Intact without significant lesions or rashes. No jaundice. Lymph Nodes:  No significant cervical adenopathy. Psych:  Alert and cooperative. Normal mood and affect.  Imaging Studies: CT A/P 09/21/2017 IMPRESSION: 1.  No acute intra-abdominal process. 2. Small uterine fibroids. 3. 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  Assessment and Plan:   Barbara Arellano is a 42 y.o. female with history of left-sided oophorectomy in 2015, presents with approximately 3 months history of left-sided abdominal pain and continuous vaginal bleeding. She does not have any other GI symptoms. Differentials include SCAD or pain secondary to acute diverticulitis or musculoskeletal or secondary to scar tissue or gynecologic etiology  - Empiric trial of Cipro and Flagyl for 2 weeks for possible diverticulitis - Discuss with her about colonoscopy if pain is not improved after treatment with antibiotics - she has ongoing vaginal bleeding, recommend checking CBC, ferritin, B12 and folate levels   Follow up in 4 weeks   Cephas Darby, MD

## 2017-12-06 ENCOUNTER — Other Ambulatory Visit: Payer: Self-pay | Admitting: *Deleted

## 2017-12-06 DIAGNOSIS — N6489 Other specified disorders of breast: Secondary | ICD-10-CM

## 2017-12-08 ENCOUNTER — Other Ambulatory Visit: Payer: Self-pay | Admitting: *Deleted

## 2017-12-08 ENCOUNTER — Ambulatory Visit
Admission: RE | Admit: 2017-12-08 | Discharge: 2017-12-08 | Disposition: A | Discharge status: Home / Self Care | Payer: PRIVATE HEALTH INSURANCE | Source: Ambulatory Visit | Attending: Oncology | Admitting: Oncology

## 2017-12-08 DIAGNOSIS — N6489 Other specified disorders of breast: Secondary | ICD-10-CM

## 2017-12-08 DIAGNOSIS — N63 Unspecified lump in unspecified breast: Secondary | ICD-10-CM

## 2017-12-15 ENCOUNTER — Ambulatory Visit
Admission: RE | Admit: 2017-12-15 | Discharge: 2017-12-15 | Disposition: A | Discharge status: Home / Self Care | Payer: Self-pay | Source: Ambulatory Visit | Attending: Oncology | Admitting: Oncology

## 2017-12-15 DIAGNOSIS — N63 Unspecified lump in unspecified breast: Secondary | ICD-10-CM

## 2017-12-15 HISTORY — PX: BREAST BIOPSY: SHX20

## 2017-12-19 LAB — SURGICAL PATHOLOGY

## 2017-12-21 ENCOUNTER — Ambulatory Visit: Payer: Self-pay

## 2017-12-26 ENCOUNTER — Encounter: Payer: Self-pay | Admitting: Diagnostic Radiology

## 2017-12-26 NOTE — Progress Notes (Signed)
The patient returns today with complaint of pain and bruising in the right breast after [place] core biopsy performed 12/15/17.  She has tried Tylenol, Ibuprofen, and used ice x 30  Minutes at a time.  She uses the ace wrap at night for support. On exam, there is a 3 cm palpable mass in the upper right breast, new since biopsy, per patient.  There is surrounding yellow-Barbara Arellano ecchymosis, measuring 10cm.   I would recommend using Arnica gel TID, and gave patient instructions. Patient was reassured that this will improve over time.  Return as needed.

## 2017-12-30 ENCOUNTER — Encounter: Payer: Self-pay | Admitting: *Deleted

## 2017-12-30 NOTE — Progress Notes (Signed)
Patient with benign biopsy.  She is to follow up with annual screening in one year.  HSIS to [facility].

## 2018-01-20 ENCOUNTER — Emergency Department
Admission: EM | Admit: 2018-01-20 | Discharge: 2018-01-20 | Disposition: A | Discharge status: Home / Self Care | Payer: PRIVATE HEALTH INSURANCE | Attending: Emergency Medicine | Admitting: Emergency Medicine

## 2018-01-20 ENCOUNTER — Other Ambulatory Visit: Payer: Self-pay

## 2018-01-20 ENCOUNTER — Emergency Department: Payer: PRIVATE HEALTH INSURANCE

## 2018-01-20 DIAGNOSIS — M542 Cervicalgia: Secondary | ICD-10-CM | POA: Diagnosis present

## 2018-01-20 DIAGNOSIS — M4712 Other spondylosis with myelopathy, cervical region: Secondary | ICD-10-CM | POA: Insufficient documentation

## 2018-01-20 DIAGNOSIS — F17210 Nicotine dependence, cigarettes, uncomplicated: Secondary | ICD-10-CM | POA: Diagnosis not present

## 2018-01-20 MED ORDER — METHOCARBAMOL 750 MG PO TABS: 750.0000 mg | ORAL_TABLET | Freq: Four times a day (QID) | ORAL | 0 refills | Status: DC

## 2018-01-20 MED ORDER — MELOXICAM 15 MG PO TABS: 15.0000 mg | ORAL_TABLET | Freq: Every day | ORAL | 0 refills | Status: DC

## 2018-01-20 MED ORDER — OXYCODONE-ACETAMINOPHEN 7.5-325 MG PO TABS: 1.0000 | ORAL_TABLET | Freq: Four times a day (QID) | ORAL | 0 refills | Status: DC | PRN

## 2018-01-20 NOTE — ED Provider Notes (Signed)
[facility] Emergency Department Provider Note   ____________________________________________   First MD Initiated Contact with Patient 01/20/18 [phone]     (approximate)  I have reviewed the triage vital signs and the nursing notes.   HISTORY  Chief Complaint Torticollis    HPI Barbara Arellano is a 42 y.o. female patient complaint of left neck pain and stiffness worsened in the past week.  Patient has a history of degenerative changes cervical spine.  Patient state unable to follow orthopedic that she was referred to because they did not accept her insurance.  Patient states decreased range of motion with left lateral movements.  Patient denies radicular component to her neck pain.  Patient rates the pain as a 9/10.  Patient described the pain as "achy".  Palliative measures for complaint. Past Medical History:  Diagnosis Date  . Cervical arthritis   . Fibroid, uterine     Patient Active Problem List   Diagnosis Date Noted  . Vaginal bleeding 12/05/2017  . Iron deficiency anemia due to chronic blood loss 12/05/2017    Past Surgical History:  Procedure Laterality Date  . BILATERAL SALPINGECTOMY     age 34  . BREAST BIOPSY Right 12/15/2017   pending path  . FRACTURE SURGERY    . OOPHORECTOMY Left 2015   ruptured cyst    Prior to Admission medications   Medication Sig Start Date End Date Taking? Authorizing Provider  acyclovir (ZOVIRAX) 800 MG tablet Take 1 tablet (800 mg total) by mouth 5 (five) times daily. Start taking if you develop a rash on your chest in the area of pain before 03/26/17. Patient not taking: Reported on 10/18/2017 03/23/17   Carrie Mew, MD  ibuprofen (ADVIL,MOTRIN) 800 MG tablet Take 1 tablet (800 mg total) by mouth every 8 (eight) hours as needed. Patient not taking: Reported on 12/05/2017 07/16/17   Sable Feil, PA-C  meloxicam (MOBIC) 15 MG tablet Take 1 tablet (15 mg total) by mouth daily. 01/20/18   Sable Feil,  PA-C  methocarbamol (ROBAXIN-750) 750 MG tablet Take 1 tablet (750 mg total) by mouth 4 (four) times daily. 01/20/18   Sable Feil, PA-C  oxyCODONE-acetaminophen (PERCOCET) 7.5-325 MG tablet Take 1 tablet by mouth every 6 (six) hours as needed for severe pain. 01/20/18   Sable Feil, PA-C  predniSONE (DELTASONE) 50 MG tablet Take one 50 mg tablet once daily for the next 5 days. Patient not taking: Reported on 12/05/2017 11/01/17   Lannie Fields, PA-C  traMADol (ULTRAM) 50 MG tablet Take 1 tablet (50 mg total) by mouth every 6 (six) hours as needed. 10/17/17 10/17/18  Schuyler Amor, MD    Allergies Patient has no known allergies.  Family History  Problem Relation Age of Onset  . Heart disease Mother   . Cancer Father 96       bone marrow  . Heart disease Father   . Breast cancer Maternal Aunt        late 56's    Social History Social History   Tobacco Use  . Smoking status: Current Every Day Smoker    Packs/day: 0.50    Types: Cigarettes  . Smokeless tobacco: Never Used  Substance Use Topics  . Alcohol use: No  . Drug use: Yes    Types: Marijuana    Review of Systems Constitutional: No fever/chills Eyes: No visual changes. ENT: No sore throat. Cardiovascular: Denies chest pain. Respiratory: Denies shortness of breath. Gastrointestinal: No  abdominal pain.  No nausea, no vomiting.  No diarrhea.  No constipation. Genitourinary: Negative for dysuria. Musculoskeletal: Negative for back pain. Skin: Negative for rash. Neurological: Negative for headaches, focal weakness or numbness. ____________________________________________   PHYSICAL EXAM:  VITAL SIGNS: ED Triage Vitals [01/20/18 0938]  Enc Vitals Group     BP 116/78     Pulse Rate 86     Resp 18     Temp 98.2 F (36.8 C)     Temp Source Oral     SpO2 100 %     Weight 157 lb (71.2 kg)     Height 5\' 4"  (1.626 m)     Head Circumference      Peak Flow      Pain Score 9     Pain Loc      Pain Edu?       Excl. in [place]?    Constitutional: Alert and oriented. Well appearing and in no acute distress.  Neck: No stridor.  Cervical spine tenderness to palpation at C5 and 6. Hematological/Lymphatic/Immunilogical: No cervical lymphadenopathy. Cardiovascular: Normal rate, regular rhythm. Grossly normal heart sounds.  Good peripheral circulation. Respiratory: Normal respiratory effort.  No retractions. Lungs CTAB. Gastrointestinal: Soft and nontender. No distention. No abdominal bruits. No CVA tenderness. Musculoskeletal: No lower extremity tenderness nor edema.  No joint effusions. Neurologic:  Normal speech and language. No gross focal neurologic deficits are appreciated. No gait instability. Skin:  Skin is warm, dry and intact. No rash noted.  Psychiatric: Mood and affect are normal. Speech and behavior are normal.  ____________________________________________   LABS (all labs ordered are listed, but only abnormal results are displayed)  Labs Reviewed - No data to display ____________________________________________  EKG   ____________________________________________  RADIOLOGY  ED MD interpretation:    Official radiology report(s): Dg Cervical Spine Complete  Result Date: 01/20/2018 CLINICAL DATA:  Left-sided neck pain and neck stiffness. Limited range of motion. EXAM: CERVICAL SPINE - COMPLETE 4+ VIEW COMPARISON:  12/27/2016 FINDINGS: There is slight chronic disc space narrowing at C5-6 and C6-7, unchanged since the prior study. No appreciable facet joint arthritis. Widely patent neural foramina bilaterally. No appreciable uncinate spurring. Alignment is normal. Prevertebral soft tissues are normal. IMPRESSION: No acute abnormality. No change since 12/27/2016. Slight chronic degenerative disc disease at C5-6 and C6-7. Electronically Signed   By: Lorriane Shire M.D.   On: 01/20/2018 10:37    ____________________________________________   PROCEDURES  Procedure(s) performed:  None  Procedures  Critical Care performed: No  ____________________________________________   INITIAL IMPRESSION / ASSESSMENT AND PLAN / ED COURSE  As part of my medical decision making, I reviewed the following data within the electronic MEDICAL RECORD NUMBER    Left lateral neck pain secondary to degenerative change in the cervical spine.  Discussed x-ray findings with patient.  Patient given discharge care instruction advised take medication as directed.  Patient advised to follow orthopedic for definitive evaluation and treatment.      ____________________________________________   FINAL CLINICAL IMPRESSION(S) / ED DIAGNOSES  Final diagnoses:  Osteoarthritis of cervical spine with myelopathy     ED Discharge Orders        Ordered    methocarbamol (ROBAXIN-750) 750 MG tablet  4 times daily     01/20/18 1044    meloxicam (MOBIC) 15 MG tablet  Daily     01/20/18 1044    oxyCODONE-acetaminophen (PERCOCET) 7.5-325 MG tablet  Every 6 hours PRN     01/20/18 1044  Note:  This document was prepared using Dragon voice recognition software and may include unintentional dictation errors.    Sable Feil, PA-C 01/20/18 1055    Eula Listen, MD 01/20/18 1530

## 2018-01-20 NOTE — Discharge Instructions (Addendum)
Take medication as directed follow-up with orthopedic for definitive evaluation and treatment.

## 2018-01-20 NOTE — ED Triage Notes (Signed)
Left sided neck pain and stiffness. Hx of same, no injury known. Does do a lot of lifting at work. Unable to turn to left. Can turn to right. Pt alert and oriented X4, active, cooperative, pt in NAD. RR even and unlabored, color WNL.

## 2018-01-20 NOTE — ED Notes (Signed)
See triage note  Presents with pain to left side of neck which is moving into left arm  Pain is becoming worse

## 2018-01-30 ENCOUNTER — Other Ambulatory Visit: Payer: Self-pay | Admitting: Student

## 2018-01-30 ENCOUNTER — Ambulatory Visit: Payer: Self-pay | Admitting: Gastroenterology

## 2018-01-30 ENCOUNTER — Other Ambulatory Visit: Payer: Self-pay

## 2018-01-30 ENCOUNTER — Encounter: Payer: Self-pay | Admitting: Gastroenterology

## 2018-01-30 DIAGNOSIS — M5412 Radiculopathy, cervical region: Secondary | ICD-10-CM

## 2018-02-13 ENCOUNTER — Ambulatory Visit
Admission: RE | Admit: 2018-02-13 | Discharge: 2018-02-13 | Disposition: A | Discharge status: Home / Self Care | Payer: PRIVATE HEALTH INSURANCE | Source: Ambulatory Visit | Attending: Student | Admitting: Student

## 2018-02-13 DIAGNOSIS — M4722 Other spondylosis with radiculopathy, cervical region: Secondary | ICD-10-CM | POA: Diagnosis not present

## 2018-02-13 DIAGNOSIS — M4802 Spinal stenosis, cervical region: Secondary | ICD-10-CM | POA: Insufficient documentation

## 2018-02-13 DIAGNOSIS — M5412 Radiculopathy, cervical region: Secondary | ICD-10-CM | POA: Diagnosis present

## 2018-02-18 ENCOUNTER — Emergency Department: Payer: PRIVATE HEALTH INSURANCE

## 2018-02-18 ENCOUNTER — Other Ambulatory Visit: Payer: Self-pay

## 2018-02-18 ENCOUNTER — Emergency Department
Admission: EM | Admit: 2018-02-18 | Discharge: 2018-02-18 | Disposition: A | Discharge status: Home / Self Care | Payer: PRIVATE HEALTH INSURANCE | Attending: Emergency Medicine | Admitting: Emergency Medicine

## 2018-02-18 DIAGNOSIS — F17210 Nicotine dependence, cigarettes, uncomplicated: Secondary | ICD-10-CM | POA: Diagnosis not present

## 2018-02-18 DIAGNOSIS — R0789 Other chest pain: Secondary | ICD-10-CM | POA: Diagnosis present

## 2018-02-18 LAB — BASIC METABOLIC PANEL
Anion gap: 9 (ref 5–15)
BUN: 16 mg/dL (ref 6–20)
CO2: 22 mmol/L (ref 22–32)
CREATININE: 0.51 mg/dL (ref 0.44–1.00)
Calcium: 9.3 mg/dL (ref 8.9–10.3)
Chloride: 106 mmol/L (ref 98–111)
GFR calc Af Amer: >60 mL/min (ref >60)
GFR calc non Af Amer: >60 mL/min (ref >60)
GLUCOSE: 91 mg/dL (ref 70–99)
Potassium: 3.6 mmol/L (ref 3.5–5.1)
SODIUM: 137 mmol/L (ref 135–145)

## 2018-02-18 LAB — CBC
HEMATOCRIT: 35.3 % (ref 35.0–47.0)
Hemoglobin: 12.3 g/dL (ref 12.0–16.0)
MCH: 32 pg (ref 26.0–34.0)
MCHC: 34.9 g/dL (ref 32.0–36.0)
MCV: 91.7 fL (ref 80.0–100.0)
PLATELETS: 227 10*3/uL (ref 150–440)
RBC: 3.85 MIL/uL (ref 3.80–5.20)
RDW: 13.9 % (ref 11.5–14.5)
WBC: 8.4 10*3/uL (ref 3.6–11.0)

## 2018-02-18 LAB — TROPONIN I: Troponin I: <0.03 ng/mL (ref <0.03)

## 2018-02-18 MED ORDER — IBUPROFEN 800 MG PO TABS: 800.0000 mg | ORAL_TABLET | Freq: Three times a day (TID) | ORAL | 0 refills | Status: DC | PRN

## 2018-02-18 MED ORDER — DIAZEPAM 5 MG PO TABS: 5.0000 mg | ORAL_TABLET | Freq: Three times a day (TID) | ORAL | 0 refills | Status: DC | PRN

## 2018-02-18 MED ORDER — KETOROLAC TROMETHAMINE 30 MG/ML IJ SOLN
30.0000 mg | Freq: Once | INTRAMUSCULAR | Status: AC
  Administered 2018-02-18: 30 mg via INTRAVENOUS
  Filled 2018-02-18: qty 1

## 2018-02-18 NOTE — ED Provider Notes (Signed)
[facility] Emergency Department Provider Note       Time seen: ----------------------------------------- 11:44 AM on 02/18/2018 -----------------------------------------   I have reviewed the triage vital signs and the nursing notes.  HISTORY   Chief Complaint Chest Pain    HPI Barbara Arellano is a 42 y.o. female with no significant past medical history who presents to the ED for sudden sharp left-sided chest pain.  Patient was worse with palpation on the left upper chest wall as well as pain with inspiration.  She does have history of anxiety, was noted to be wheezing in route by EMS and was given 1 albuterol neb.  She denies fevers, chills or other complaints.  Past Medical History:  Diagnosis Date  . Cervical arthritis   . Fibroid, uterine     Patient Active Problem List   Diagnosis Date Noted  . Vaginal bleeding 12/05/2017  . Iron deficiency anemia due to chronic blood loss 12/05/2017    Past Surgical History:  Procedure Laterality Date  . BILATERAL SALPINGECTOMY     age 65  . BREAST BIOPSY Right 12/15/2017   pending path  . FRACTURE SURGERY    . OOPHORECTOMY Left 2015   ruptured cyst    Allergies Patient has no known allergies.  Social History Social History   Tobacco Use  . Smoking status: Current Every Day Smoker    Packs/day: 0.50    Types: Cigarettes  . Smokeless tobacco: Never Used  Substance Use Topics  . Alcohol use: No  . Drug use: Yes    Types: Marijuana   Review of Systems Constitutional: Negative for fever. Cardiovascular: Positive for chest pain Respiratory: Negative for shortness of breath. Gastrointestinal: Negative for abdominal pain, vomiting and diarrhea. Musculoskeletal: Negative for back pain. Skin: Negative for rash. Neurological: Negative for headaches, focal weakness or numbness.  All systems negative/normal/unremarkable except as stated in the  HPI  ____________________________________________   PHYSICAL EXAM:  VITAL SIGNS: ED Triage Vitals  Enc Vitals Group     BP      Pulse      Resp      Temp      Temp src      SpO2      Weight      Height      Head Circumference      Peak Flow      Pain Score      Pain Loc      Pain Edu?      Excl. in [place]?    Constitutional: Alert and oriented. Well appearing and in no distress. Eyes: Conjunctivae are normal. Normal extraocular movements. ENT   Head: Normocephalic and atraumatic.   Nose: No congestion/rhinnorhea.   Mouth/Throat: Mucous membranes are moist.   Neck: No stridor. Cardiovascular: Normal rate, regular rhythm. No murmurs, rubs, or gallops. Respiratory: Normal respiratory effort without tachypnea nor retractions. Breath sounds are clear and equal bilaterally. No wheezes/rales/rhonchi. Gastrointestinal: Soft and nontender. Normal bowel sounds Musculoskeletal: Nontender with normal range of motion in extremities. No lower extremity tenderness nor edema. Neurologic:  Normal speech and language. No gross focal neurologic deficits are appreciated.  Skin:  Skin is warm, dry and intact. No rash noted. Psychiatric: Mood and affect are normal. Speech and behavior are normal.  ____________________________________________  EKG: Interpreted by me.  Sinus rhythm the rate of 79 bpm, normal PR interval, normal QRS, normal QT  ____________________________________________  ED COURSE:  As part of my medical decision making, I reviewed  the following data within the [place] History obtained from family if available, nursing notes, old chart and ekg, as well as notes from prior ED visits. Patient presented for chest pain, we will assess with labs and imaging as indicated at this time.   Procedures ____________________________________________   LABS (pertinent positives/negatives)  Labs Reviewed  BASIC METABOLIC PANEL  CBC  TROPONIN I     RADIOLOGY Images were viewed by me  Chest x-ray IMPRESSION: No active disease. ____________________________________________  DIFFERENTIAL DIAGNOSIS   Musculoskeletal pain, costochondritis, PE, pneumothorax, MI  FINAL ASSESSMENT AND PLAN  Chest pain   Plan: The patient had presented for chest pain which appeared to be reproducible and on the chest wall itself. Patient's labs did not reveal any acute process. Patient's imaging was also negative.  We will try Valium for her symptoms.  Otherwise she is cleared for outpatient follow-up   Laurence Aly, MD   Note: This note was generated in part or whole with voice recognition software. Voice recognition is usually quite accurate but there are transcription errors that can and very often do occur. I apologize for any typographical errors that were not detected and corrected.     Earleen Newport, MD 02/18/18 1314

## 2018-02-18 NOTE — ED Triage Notes (Signed)
Pt brought in via EMS from work. PT experienced sharp left sided chest pain. Worse upon palpation and inspiration. Smokes 1pck per day but 3 weeks ago cut down to 10 cigarettes a day. Hx anxiety. Left side wheezing resolved after 1 albuterol neb with EMS. EMS vitals as follows: 114/71 100%RA 78HR.

## 2018-06-22 ENCOUNTER — Encounter: Payer: Self-pay | Admitting: Emergency Medicine

## 2018-06-22 ENCOUNTER — Emergency Department: Payer: PRIVATE HEALTH INSURANCE

## 2018-06-22 ENCOUNTER — Other Ambulatory Visit: Payer: Self-pay

## 2018-06-22 ENCOUNTER — Emergency Department
Admission: EM | Admit: 2018-06-22 | Discharge: 2018-06-22 | Disposition: A | Discharge status: Home / Self Care | Payer: PRIVATE HEALTH INSURANCE | Attending: Emergency Medicine | Admitting: Emergency Medicine

## 2018-06-22 DIAGNOSIS — F17210 Nicotine dependence, cigarettes, uncomplicated: Secondary | ICD-10-CM | POA: Diagnosis not present

## 2018-06-22 DIAGNOSIS — R079 Chest pain, unspecified: Secondary | ICD-10-CM | POA: Insufficient documentation

## 2018-06-22 DIAGNOSIS — F1210 Cannabis abuse, uncomplicated: Secondary | ICD-10-CM | POA: Diagnosis not present

## 2018-06-22 LAB — CBC WITH DIFFERENTIAL/PLATELET
ABS IMMATURE GRANULOCYTES: 0.02 10*3/uL (ref 0.00–0.07)
BASOS ABS: 0.1 10*3/uL (ref 0.0–0.1)
Basophils Relative: 1 %
EOS ABS: 0.5 10*3/uL (ref 0.0–0.5)
Eosinophils Relative: 5 %
HEMATOCRIT: 34.7 % — AB (ref 36.0–46.0)
Hemoglobin: 11.8 g/dL — ABNORMAL LOW (ref 12.0–15.0)
IMMATURE GRANULOCYTES: 0 %
LYMPHS ABS: 2.4 10*3/uL (ref 0.7–4.0)
Lymphocytes Relative: 24 %
MCH: 31.6 pg (ref 26.0–34.0)
MCHC: 34 g/dL (ref 30.0–36.0)
MCV: 92.8 fL (ref 80.0–100.0)
Monocytes Absolute: 0.6 10*3/uL (ref 0.1–1.0)
Monocytes Relative: 6 %
NEUTROS ABS: 6.5 10*3/uL (ref 1.7–7.7)
NEUTROS PCT: 64 %
NRBC: 0 % (ref 0.0–0.2)
PLATELETS: 228 10*3/uL (ref 150–400)
RBC: 3.74 MIL/uL — ABNORMAL LOW (ref 3.87–5.11)
RDW: 12.3 % (ref 11.5–15.5)
WBC: 10.1 10*3/uL (ref 4.0–10.5)

## 2018-06-22 LAB — BASIC METABOLIC PANEL
ANION GAP: 8 (ref 5–15)
BUN: 10 mg/dL (ref 6–20)
CALCIUM: 9 mg/dL (ref 8.9–10.3)
CO2: 23 mmol/L (ref 22–32)
Chloride: 107 mmol/L (ref 98–111)
Creatinine, Ser: 0.5 mg/dL (ref 0.44–1.00)
Glucose, Bld: 90 mg/dL (ref 70–99)
POTASSIUM: 3.4 mmol/L — AB (ref 3.5–5.1)
Sodium: 138 mmol/L (ref 135–145)

## 2018-06-22 LAB — TROPONIN I: Troponin I: <0.03 ng/mL (ref <0.03)

## 2018-06-22 MED ORDER — KETOROLAC TROMETHAMINE 30 MG/ML IJ SOLN
30.0000 mg | Freq: Once | INTRAMUSCULAR | Status: AC
  Administered 2018-06-22: 30 mg via INTRAMUSCULAR
  Filled 2018-06-22: qty 1

## 2018-06-22 NOTE — ED Provider Notes (Signed)
[facility] Emergency Department Provider Note  ____________________________________________   First MD Initiated Contact with Patient 06/22/18 1020     (approximate)  I have reviewed the triage vital signs and the nursing notes.   HISTORY  Chief Complaint Chest Pain   HPI Barbara Arellano is a 42 y.o. female who is presenting to the emergency department with persistent left-sided chest pain ongoing since this past September.  She says that she was seen in the emergency department this past September and was diagnosed with a muscle spasm.  However, she states that despite using Valium at that time she continues to have spasm to the left side of her chest.  She says that the pain feels like a tightening and last about a minute and a half.  Does not radiate to the arm or the back.  Says that taking a deep breath helps with the pain.  Says that she also has a history of cervical spine issues for which she sees a Publishing rights manager.  Says that she received steroid injections.  Says that she also has carpal tunnel diagnosed on the left.  Denies any heavy lifting.  Says that she smokes and has a family history of heart disease.  Denying any pain at this time.   Past Medical History:  Diagnosis Date  . Cervical arthritis   . Fibroid, uterine     Patient Active Problem List   Diagnosis Date Noted  . Vaginal bleeding 12/05/2017  . Iron deficiency anemia due to chronic blood loss 12/05/2017    Past Surgical History:  Procedure Laterality Date  . BILATERAL SALPINGECTOMY     age 35  . BREAST BIOPSY Right 12/15/2017   pending path  . FRACTURE SURGERY    . OOPHORECTOMY Left 2015   ruptured cyst    Prior to Admission medications   Medication Sig Start Date End Date Taking? Authorizing Provider  ibuprofen (ADVIL,MOTRIN) 200 MG tablet Take 1 tablet by mouth every 6 (six) hours as needed.   Yes [provider]  ibuprofen (ADVIL,MOTRIN) 800 MG tablet Take 1  tablet (800 mg total) by mouth every 8 (eight) hours as needed. 02/18/18  Yes Earleen Newport, MD  acyclovir (ZOVIRAX) 800 MG tablet Take 1 tablet (800 mg total) by mouth 5 (five) times daily. Start taking if you develop a rash on your chest in the area of pain before 03/26/17. Patient not taking: Reported on 10/18/2017 03/23/17   Carrie Mew, MD  diazepam (VALIUM) 5 MG tablet Take 1 tablet (5 mg total) by mouth every 8 (eight) hours as needed for muscle spasms. Patient not taking: Reported on 06/22/2018 02/18/18   Earleen Newport, MD  ibuprofen (ADVIL,MOTRIN) 800 MG tablet Take 1 tablet (800 mg total) by mouth every 8 (eight) hours as needed. Patient not taking: Reported on 06/22/2018 07/16/17   Sable Feil, PA-C  meloxicam (MOBIC) 15 MG tablet Take 1 tablet (15 mg total) by mouth daily. Patient not taking: Reported on 06/22/2018 01/20/18   Sable Feil, PA-C  methocarbamol (ROBAXIN-750) 750 MG tablet Take 1 tablet (750 mg total) by mouth 4 (four) times daily. Patient not taking: Reported on 06/22/2018 01/20/18   Sable Feil, PA-C  methylPREDNISolone (MEDROL DOSEPAK) 4 MG TBPK tablet TAKE AS DIRECTED FOR 6 DAYS 01/26/18   [provider]  oxyCODONE-acetaminophen (PERCOCET) 7.5-325 MG tablet Take 1 tablet by mouth every 6 (six) hours as needed for severe pain. Patient not taking: Reported on 06/22/2018  01/20/18   Sable Feil, PA-C  predniSONE (DELTASONE) 50 MG tablet Take one 50 mg tablet once daily for the next 5 days. Patient not taking: Reported on 12/05/2017 11/01/17   Lannie Fields, PA-C  traMADol (ULTRAM) 50 MG tablet Take 1 tablet (50 mg total) by mouth every 6 (six) hours as needed. Patient not taking: Reported on 06/22/2018 10/17/17 10/17/18  Schuyler Amor, MD    Allergies Patient has no known allergies.  Family History  Problem Relation Age of Onset  . Heart disease Mother   . Cancer Father 1       bone marrow  . Heart disease Father   . Breast  cancer Maternal Aunt        late 35's    Social History Social History   Tobacco Use  . Smoking status: Current Every Day Smoker    Packs/day: 0.50    Types: Cigarettes  . Smokeless tobacco: Never Used  Substance Use Topics  . Alcohol use: No  . Drug use: Yes    Types: Marijuana    Review of Systems  Constitutional: No fever/chills Eyes: No visual changes. ENT: No sore throat. Cardiovascular: As above Respiratory: Denies shortness of breath. Gastrointestinal: No abdominal pain.  No nausea, no vomiting.  No diarrhea.  No constipation. Genitourinary: Negative for dysuria. Musculoskeletal: Negative for back pain. Skin: Negative for rash. Neurological: Negative for headaches, focal weakness or numbness.   ____________________________________________   PHYSICAL EXAM:  VITAL SIGNS: ED Triage Vitals  Enc Vitals Group     BP 06/22/18 0958 118/73     Pulse Rate 06/22/18 0958 77     Resp --      Temp 06/22/18 0958 98.4 F (36.9 C)     Temp Source 06/22/18 0958 Oral     SpO2 06/22/18 0958 100 %     Weight 06/22/18 1000 152 lb (68.9 kg)     Height --      Head Circumference --      Peak Flow --      Pain Score 06/22/18 1000 10     Pain Loc --      Pain Edu? --      Excl. in [place]? --     Constitutional: Alert and oriented. Well appearing and in no acute distress. Eyes: Conjunctivae are normal.  Head: Atraumatic. Nose: No congestion/rhinnorhea. Mouth/Throat: Mucous membranes are moist.  Neck: No stridor.   Cardiovascular: Normal rate, regular rhythm. Grossly normal heart sounds.  Patient with reproducible chest pain over the left pectoralis major muscle anteriorly.  No point tenderness.  No ecchymosis. Respiratory: Normal respiratory effort.  No retractions. Lungs CTAB. Gastrointestinal: Soft and nontender. No distention.  Musculoskeletal: No lower extremity tenderness nor edema.  No joint effusions. Neurologic:  Normal speech and language. No gross focal neurologic  deficits are appreciated. Skin:  Skin is warm, dry and intact. No rash noted. Psychiatric: Mood and affect are normal. Speech and behavior are normal.  ____________________________________________   LABS (all labs ordered are listed, but only abnormal results are displayed)  Labs Reviewed  CBC WITH DIFFERENTIAL/PLATELET - Abnormal; Notable for the following components:      Result Value   RBC 3.74 (*)    Hemoglobin 11.8 (*)    HCT 34.7 (*)    All other components within normal limits  BASIC METABOLIC PANEL - Abnormal; Notable for the following components:   Potassium 3.4 (*)    All other components within normal limits  TROPONIN I   ____________________________________________  EKG  ED ECG REPORT I, Doran Stabler, the attending physician, personally viewed and interpreted this ECG.   Date: 06/22/2018  EKG Time: 1003  Rate: 80  Rhythm: normal sinus rhythm  Axis: Normal  Intervals:none  ST&T Change: No ST segment elevation or depression.  No abnormal T wave inversion.  ____________________________________________  RADIOLOGY  Chest x-ray without acute disease ____________________________________________   PROCEDURES  Procedure(s) performed:   Procedures  Critical Care performed:   ____________________________________________   INITIAL IMPRESSION / ASSESSMENT AND PLAN / ED COURSE  Pertinent labs & imaging results that were available during my care of the patient were reviewed by me and considered in my medical decision making (see chart for details).  Differential diagnosis includes, but is not limited to, ACS, aortic dissection, pulmonary embolism, cardiac tamponade, pneumothorax, pneumonia, pericarditis, myocarditis, GI-related causes including esophagitis/gastritis, and musculoskeletal chest wall pain.   As part of my medical decision making, I reviewed the following data within the electronic MEDICAL RECORD NUMBER Notes from prior ED visits  PE RC  negative.  Patient denies taking any exogenous hormones.  ----------------------------------------- 12:19 PM on 06/22/2018 -----------------------------------------  Patient at this time still without any acute distress.  Very reassuring lab work, EKG as well as chest x-ray.  Likely continued muscle spasm.  I do not believe this is related to cardiac disease as she has had a very reassuring work-up for months of symptoms.  Will refer to cardiology as well as primary care.  Patient understanding the diagnosis well treatment and willing to comply. ____________________________________________   FINAL CLINICAL IMPRESSION(S) / ED DIAGNOSES  Chest pain.   NEW MEDICATIONS STARTED DURING THIS VISIT:  New Prescriptions   No medications on file     Note:  This document was prepared using Dragon voice recognition software and may include unintentional dictation errors.     Orbie Pyo, MD 06/22/18 [id number]

## 2018-06-22 NOTE — ED Triage Notes (Signed)
Pt with chest pain that started in September, was seen here, and the pain has continued and increased to 10/10. Pt describes pain as sharpe, 10/10. Pain gets better with inhalation.

## 2018-12-09 IMAGING — US US TRANSVAGINAL NON-OB
1 series · 13 of 25 positions shown · non-contrast
Comparison: None in PACs

CLINICAL DATA: Right lower quadrant abdominal discomfort for the
past 3 days. Vaginal bleeding for the past 4 days. Onset of the most
recentmenstrual period was September 18, 2017. The left ovary is
reportedly surgically absent.

EXAM:
TRANSABDOMINAL AND TRANSVAGINAL ULTRASOUND OF PELVIS
DOPPLER ULTRASOUND OF OVARIES
TECHNIQUE: Both transabdominal and transvaginal ultrasound examinations of the
pelvis were performed. Transabdominal technique was performed for
global imaging of the pelvis including uterus, ovaries, adnexal
regions, and pelvic cul-de-sac.
It was necessary to proceed with endovaginal exam following the
transabdominal exam to visualize the uterus, endometrium, ovaries,
and adnexal structures.. Color and duplex Doppler ultrasound was
utilized to evaluate blood flow to the ovaries.

[Series 1: us transvaginal non-ob · 0.23mm/px · 13 of 119 slices shown]
[im 1/119]
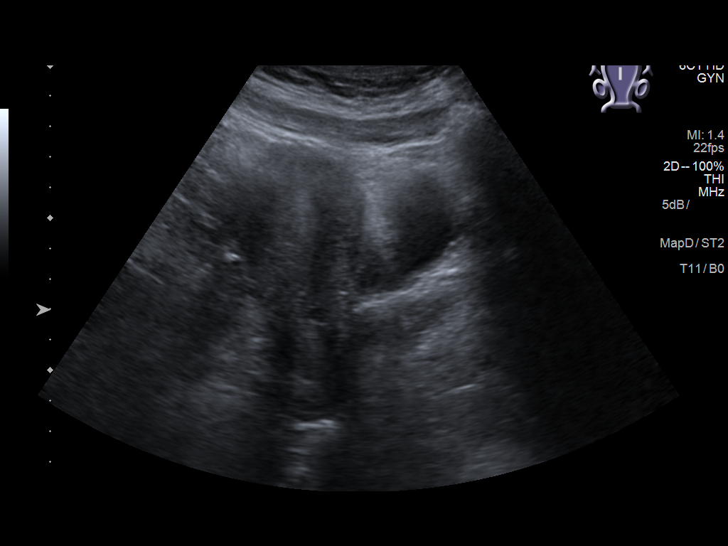
[im 10/119]
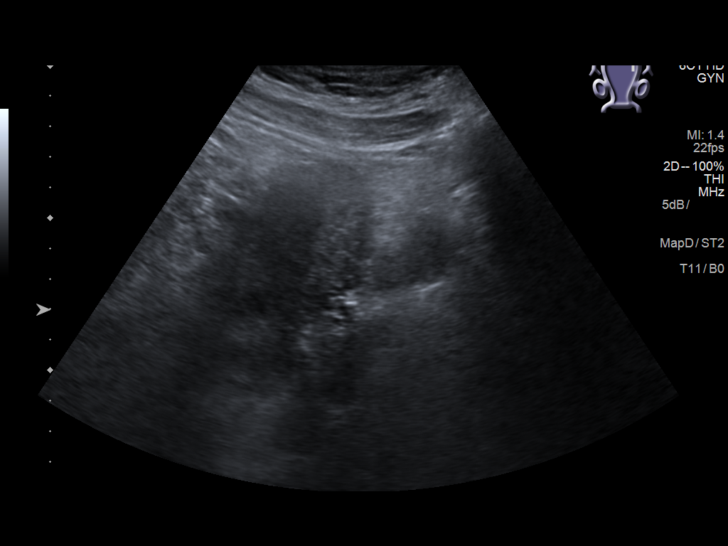
[im 20/119]
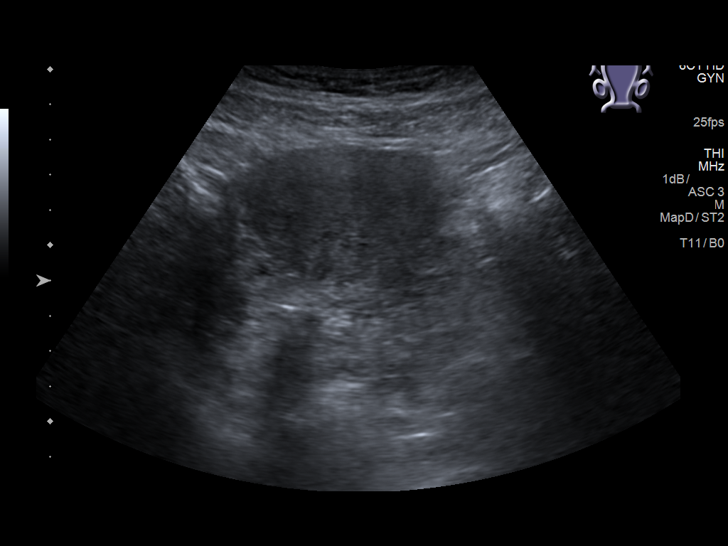
[im 30/119]
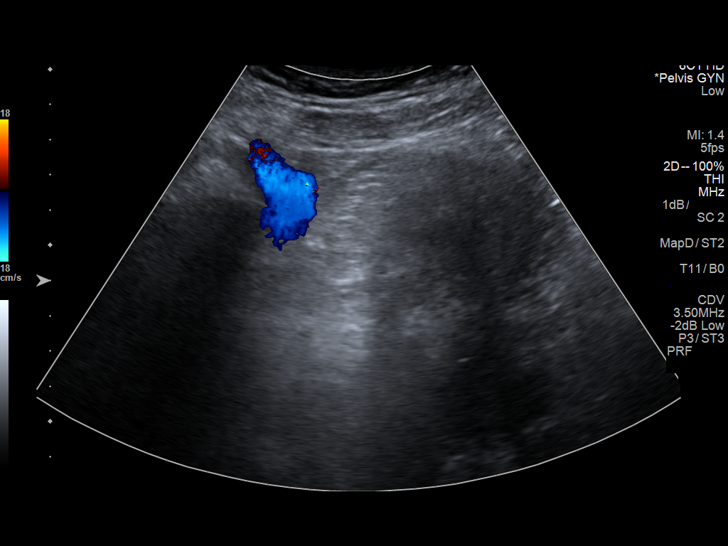
[im 40/119]
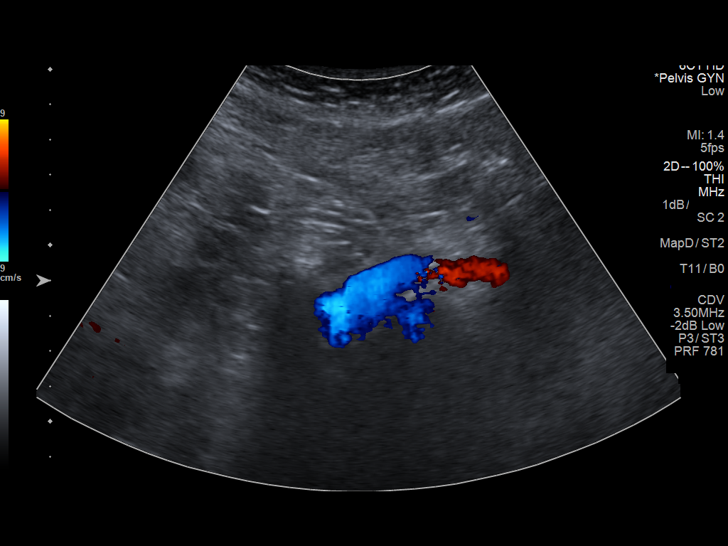
[im 50/119]
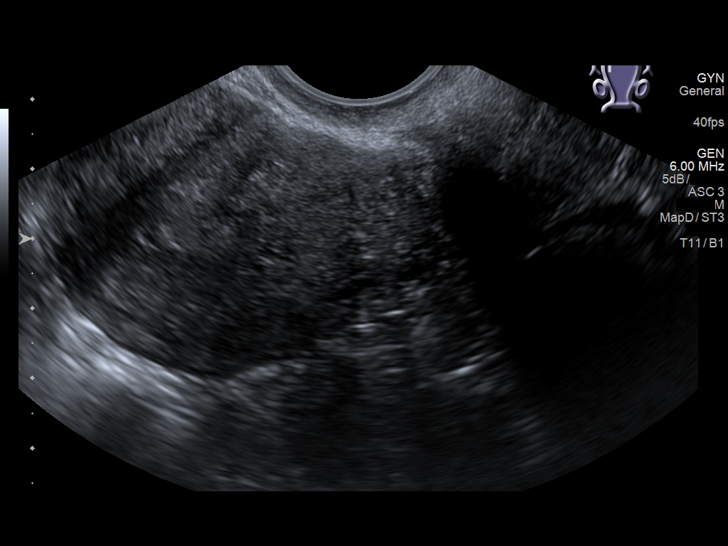
[im 60/119]
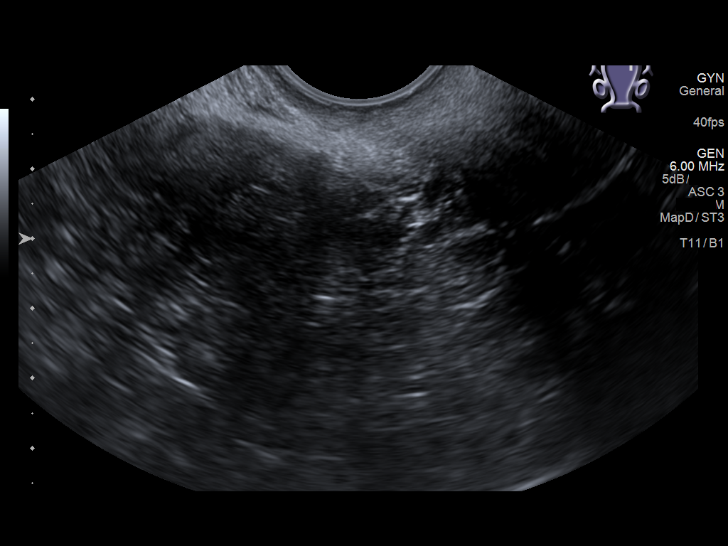
[im 69/119]
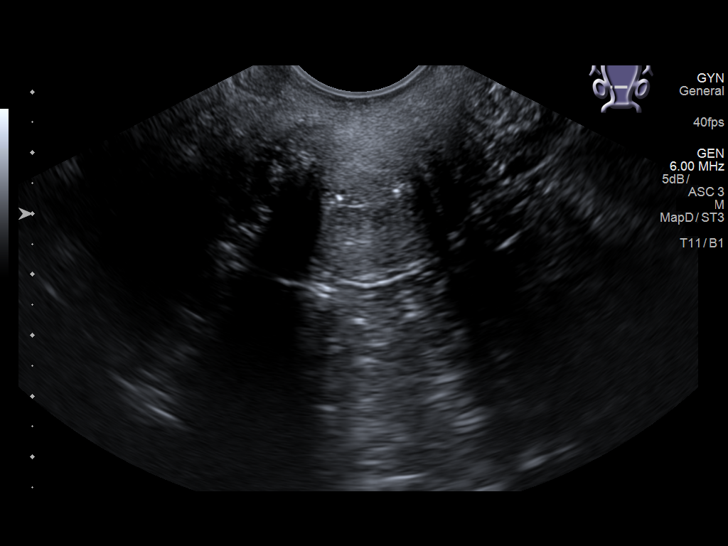
[im 79/119]
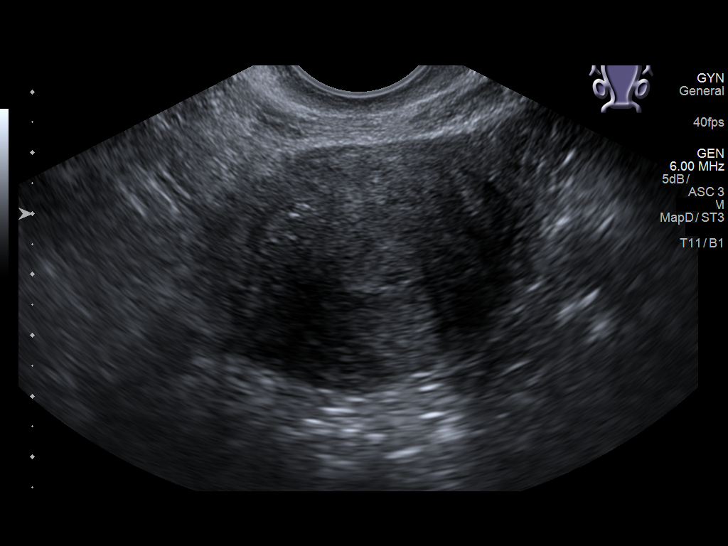
[im 89/119]
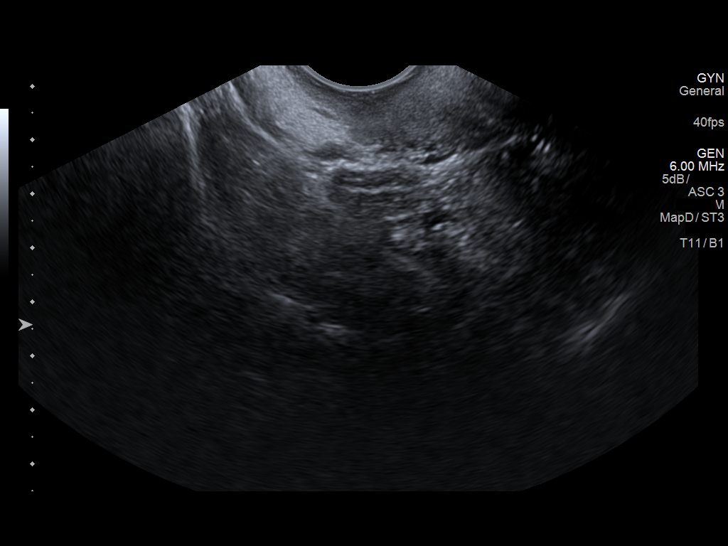
[im 99/119]
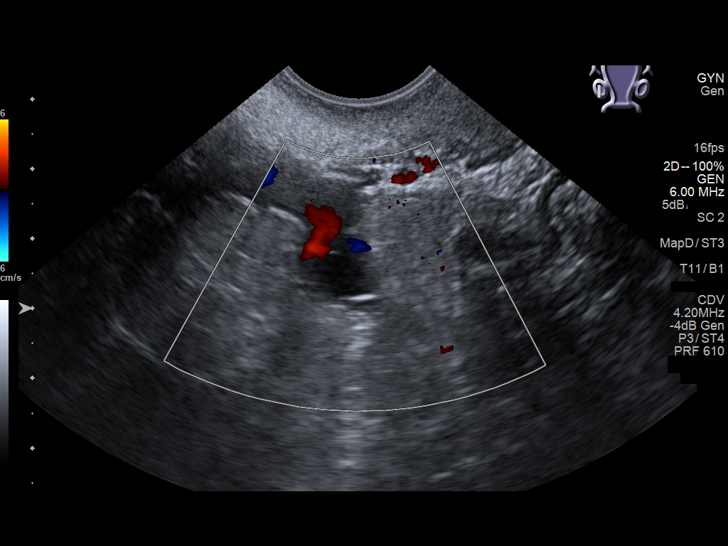
[im 109/119]
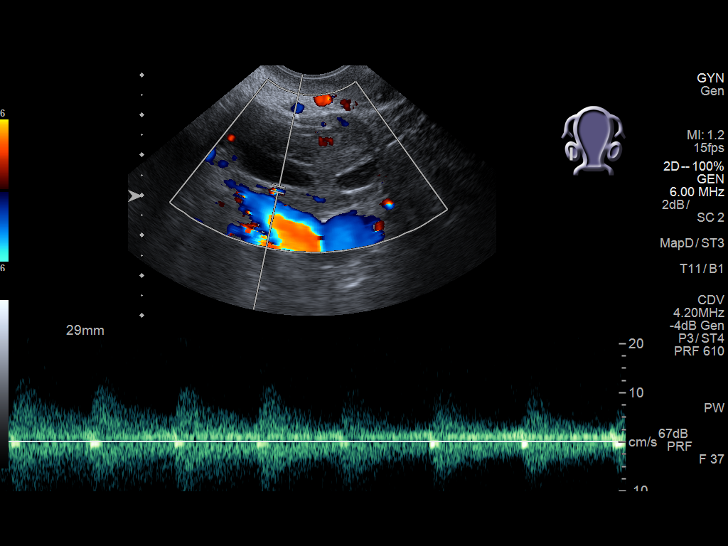
[im 119/119]
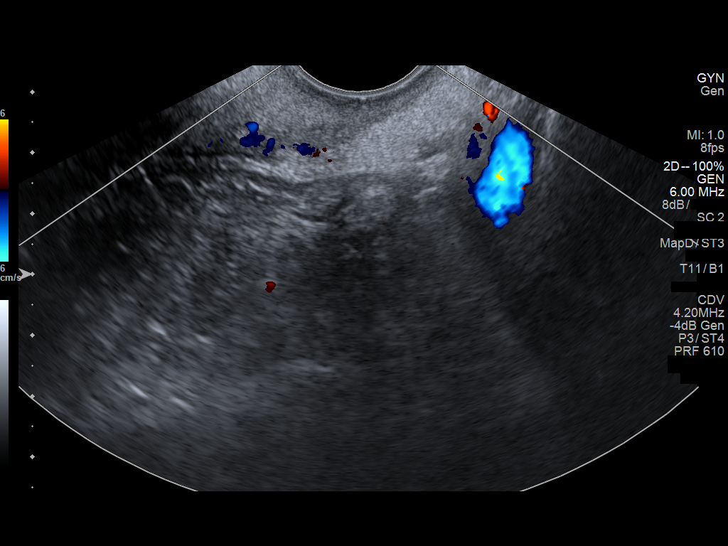

[13 of 25 positions shown; findings below may reference images not displayed]

FINDINGS: Uterus

Measurements: 7.9 x 4.4 x 5.6 cm. No fibroids or other mass
visualized.

Endometrium

Thickness: 6.7 mm.  No focal abnormality visualized.

Right ovary

Measurements: 4.5 x 2.5 x 2.8 cm.. Normal appearance/no adnexal
mass.

Left ovary

The left ovary was not visualized and has reportedly surgically
absent..

Pulsed Doppler evaluation of the right ovary demonstrates normal
low-resistance arterial and venous waveforms.

Other findings

No abnormal free fluid.
IMPRESSION: Normal appearance of the uterus, endometrium, and right ovary. The
left ovary is reportedly surgically absent.

## 2019-04-08 ENCOUNTER — Emergency Department
Admission: EM | Admit: 2019-04-08 | Discharge: 2019-04-08 | Disposition: A | Discharge status: Home / Self Care | Payer: PRIVATE HEALTH INSURANCE | Attending: Emergency Medicine | Admitting: Emergency Medicine

## 2019-04-08 ENCOUNTER — Other Ambulatory Visit: Payer: Self-pay

## 2019-04-08 DIAGNOSIS — M7502 Adhesive capsulitis of left shoulder: Secondary | ICD-10-CM | POA: Insufficient documentation

## 2019-04-08 DIAGNOSIS — Z79899 Other long term (current) drug therapy: Secondary | ICD-10-CM | POA: Insufficient documentation

## 2019-04-08 DIAGNOSIS — F17210 Nicotine dependence, cigarettes, uncomplicated: Secondary | ICD-10-CM | POA: Insufficient documentation

## 2019-04-08 MED ORDER — ORPHENADRINE CITRATE 30 MG/ML IJ SOLN
60.0000 mg | Freq: Two times a day (BID) | INTRAMUSCULAR | Status: DC
  Administered 2019-04-08: 13:00:00 60 mg via INTRAMUSCULAR
  Filled 2019-04-08: qty 2

## 2019-04-08 MED ORDER — KETOROLAC TROMETHAMINE 30 MG/ML IJ SOLN
30.0000 mg | Freq: Once | INTRAMUSCULAR | Status: AC
  Administered 2019-04-08: 13:00:00 30 mg via INTRAMUSCULAR
  Filled 2019-04-08: qty 1

## 2019-04-08 MED ORDER — CYCLOBENZAPRINE HCL 10 MG PO TABS: 10.0000 mg | ORAL_TABLET | Freq: Three times a day (TID) | ORAL | 0 refills | Status: DC | PRN

## 2019-04-08 MED ORDER — PREDNISONE 10 MG (48) PO TBPK: ORAL_TABLET | ORAL | 0 refills | Status: DC

## 2019-04-08 NOTE — ED Triage Notes (Signed)
Pt c/o pain radiating from left side of neck into the left arm with some numbness has been having issues since the first of the year and saw a neurologist and referred to PT but never followed through due to covid and in the past week the pain has worsened.

## 2019-04-08 NOTE — Discharge Instructions (Signed)
Follow-up with [facility] clinic orthopedics.  Please call for an appointment.  Use medications as prescribed.  Apply ice to the left shoulder.  You need to do range of motion exercises so this will not become a frozen shoulder.  Return if worsening.

## 2019-04-08 NOTE — ED Provider Notes (Signed)
[facility] Emergency Department Provider Note  ____________________________________________   First MD Initiated Contact with Patient 04/08/19 1247     (approximate)  I have reviewed the triage vital signs and the nursing notes.   HISTORY  Chief Complaint Neck Pain    HPI Barbara Arellano is a 43 y.o. female presents to the emergency department complaining of left-sided neck and left shoulder pain.  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  States she cannot reach overhead or behind her back.  She did see a neurologist for her cervical arthritis.  She states they did a cortisone injection.  She is not sure exactly where.  She states she has been through everything and nothing helps.  No numbness or tingling    Past Medical History:  Diagnosis Date  . Cervical arthritis   . Fibroid, uterine     Patient Active Problem List   Diagnosis Date Noted  . Vaginal bleeding 12/05/2017  . Iron deficiency anemia due to chronic blood loss 12/05/2017    Past Surgical History:  Procedure Laterality Date  . BILATERAL SALPINGECTOMY     age 61  . BREAST BIOPSY Right 12/15/2017   pending path  . FRACTURE SURGERY    . OOPHORECTOMY Left 2015   ruptured cyst    Prior to Admission medications   Medication Sig Start Date End Date Taking? Authorizing Provider  acyclovir (ZOVIRAX) 800 MG tablet Take 1 tablet (800 mg total) by mouth 5 (five) times daily. Start taking if you develop a rash on your chest in the area of pain before 03/26/17. Patient not taking: Reported on 10/18/2017 03/23/17   Carrie Mew, MD  diazepam (VALIUM) 5 MG tablet Take 1 tablet (5 mg total) by mouth every 8 (eight) hours as needed for muscle spasms. Patient not taking: Reported on 06/22/2018 02/18/18   Earleen Newport, MD  ibuprofen (ADVIL,MOTRIN) 200 MG tablet Take 1 tablet by mouth every 6 (six) hours as needed.    [provider]  ibuprofen (ADVIL,MOTRIN) 800 MG tablet Take 1 tablet (800 mg total) by mouth every 8 (eight) hours as needed. Patient not taking: Reported on 06/22/2018 07/16/17   Sable Feil, PA-C  ibuprofen (ADVIL,MOTRIN) 800 MG tablet Take 1 tablet (800 mg total) by mouth every 8 (eight) hours as needed. 02/18/18   Earleen Newport, MD  meloxicam (MOBIC) 15 MG tablet Take 1 tablet (15 mg total) by mouth daily. Patient not taking: Reported on 06/22/2018 01/20/18   Sable Feil, PA-C  methocarbamol (ROBAXIN-750) 750 MG tablet Take 1 tablet (750 mg total) by mouth 4 (four) times daily. Patient not taking: Reported on 06/22/2018 01/20/18   Sable Feil, PA-C  methylPREDNISolone (MEDROL DOSEPAK) 4 MG TBPK tablet TAKE AS DIRECTED FOR 6 DAYS 01/26/18   [provider]  oxyCODONE-acetaminophen (PERCOCET) 7.5-325 MG tablet Take 1 tablet by mouth every 6 (six) hours as needed for severe pain. Patient not taking: Reported on 06/22/2018 01/20/18   Sable Feil, PA-C  predniSONE (DELTASONE) 50 MG tablet Take one 50 mg tablet once daily for the next 5 days. Patient not taking: Reported on 12/05/2017 11/01/17   Lannie Fields, PA-C    Allergies Patient has no known allergies.  Family History  Problem Relation Age of Onset  . Heart disease Mother   . Cancer Father 55       bone marrow  .  Heart disease Father   . Breast cancer Maternal Aunt        late 60's    Social History Social History   Tobacco Use  . Smoking status: Current Every Day Smoker    Packs/day: 0.50    Types: Cigarettes  . Smokeless tobacco: Never Used  Substance Use Topics  . Alcohol use: No  . Drug use: Yes    Types: Marijuana    Review of Systems  Constitutional: No fever/chills Eyes: No visual changes. ENT: No sore throat. Respiratory: Denies cough Genitourinary: Negative for dysuria. Musculoskeletal: Negative for back pain.  Positive for left shoulder pain Skin: Negative for rash.     ____________________________________________   PHYSICAL EXAM:  VITAL SIGNS: ED Triage Vitals  Enc Vitals Group     BP 04/08/19 1209 130/76     Pulse Rate 04/08/19 1209 87     Resp 04/08/19 1209 17     Temp 04/08/19 1209 98.3 F (36.8 C)     Temp Source 04/08/19 1209 Oral     SpO2 04/08/19 1209 98 %     Weight 04/08/19 1211 152 lb (68.9 kg)     Height 04/08/19 1211 5\' 4"  (1.626 m)     Head Circumference --      Peak Flow --      Pain Score 04/08/19 1210 10     Pain Loc --      Pain Edu? --      Excl. in [place]? --     Constitutional: Alert and oriented. Well appearing and in no acute distress. Eyes: Conjunctivae are normal.  Head: Atraumatic. Nose: No congestion/rhinnorhea. Mouth/Throat: Mucous membranes are moist.   Neck:  supple no lymphadenopathy noted Cardiovascular: Normal rate, regular rhythm. Heart sounds are normal Respiratory: Normal respiratory effort.  No retractions, lungs c t a  GU: deferred Musculoskeletal: Decreased range of motion of the left shoulder with overhead reach internal and external rotation.  Grips are equal bilaterally.  The left shoulder is tender to palpation at the joint space.  No redness is noted.  Neurologic:  Normal speech and language.  Skin:  Skin is warm, dry and intact. No rash noted. Psychiatric: Mood and affect are normal. Speech and behavior are normal.  ____________________________________________   LABS (all labs ordered are listed, but only abnormal results are displayed)  Labs Reviewed - No data to display ____________________________________________   ____________________________________________  RADIOLOGY    ____________________________________________   PROCEDURES  Procedure(s) performed: Toradol 30 mg IM, Norflex 60 mg IM   Procedures    ____________________________________________   INITIAL IMPRESSION / ASSESSMENT AND PLAN / ED COURSE  Pertinent labs & imaging results that were available during my care  of the patient were reviewed by me and considered in my medical decision making (see chart for details).   Patient is 42 year old female presents emergency department complaint of left shoulder pain see HPI  Physical exam shows decreased range of motion of the left shoulder typical of adhesive capsulitis  Explained the findings to the patient.  She is deferring x-rays at this time.  She states you have plenty of x-rays on me.  She was given an injection of Toradol 30 mg and Norflex 60 mg.  She is to follow-up with orthopedics.  She was given a prescription for Sterapred and Flexeril.  She was discharged stable condition.    Barbara Arellano was evaluated in Emergency Department on 04/08/2019 for the symptoms described in the history of present illness.  She was evaluated in the context of the global COVID-19 pandemic, which necessitated consideration that the patient might be at risk for infection with the SARS-CoV-2 virus that causes COVID-19. Institutional protocols and algorithms that pertain to the evaluation of patients at risk for COVID-19 are in a state of rapid change based on information released by regulatory bodies including the CDC and federal and state organizations. These policies and algorithms were followed during the patient's care in the ED.   As part of my medical decision making, I reviewed the following data within the [place] notes reviewed and incorporated, Old chart reviewed, Notes from prior ED visits and [place] Controlled Substance Database  ____________________________________________   FINAL CLINICAL IMPRESSION(S) / ED DIAGNOSES  Final diagnoses:  Adhesive capsulitis of left shoulder      NEW MEDICATIONS STARTED DURING THIS VISIT:  New Prescriptions   No medications on file     Note:  This document was prepared using Dragon voice recognition software and may include unintentional dictation errors.    Versie Starks, PA-C 04/08/19  1355    Arta Silence, MD 04/08/19 1606

## 2019-04-08 NOTE — ED Notes (Signed)
Pt with c/o burning in back of left neck and left shoulder. Pt with mild swelling in left shoulder, no redness noted. Skin intact

## 2019-04-12 ENCOUNTER — Other Ambulatory Visit: Payer: Self-pay

## 2019-04-12 ENCOUNTER — Emergency Department
Admission: EM | Admit: 2019-04-12 | Discharge: 2019-04-12 | Disposition: A | Discharge status: Home / Self Care | Payer: Self-pay | Attending: Emergency Medicine | Admitting: Emergency Medicine

## 2019-04-12 ENCOUNTER — Encounter: Payer: Self-pay | Admitting: Emergency Medicine

## 2019-04-12 DIAGNOSIS — M25512 Pain in left shoulder: Secondary | ICD-10-CM | POA: Insufficient documentation

## 2019-04-12 DIAGNOSIS — F17210 Nicotine dependence, cigarettes, uncomplicated: Secondary | ICD-10-CM | POA: Insufficient documentation

## 2019-04-12 DIAGNOSIS — R202 Paresthesia of skin: Secondary | ICD-10-CM | POA: Insufficient documentation

## 2019-04-12 DIAGNOSIS — M542 Cervicalgia: Secondary | ICD-10-CM | POA: Insufficient documentation

## 2019-04-12 NOTE — ED Notes (Signed)
See triage note  Presents with left shoulder pain  States she was seen on Sunday for same  States she feels like pain is getting worse

## 2019-04-12 NOTE — ED Provider Notes (Signed)
[facility] Emergency Department Provider Note  ____________________________________________  Time seen: Approximately 5:04 PM  I have reviewed the triage vital signs and the nursing notes.   HISTORY  Chief Complaint Shoulder Pain    HPI Barbara Arellano is a 43 y.o. female presents to the emergency department with persistent left-sided shoulder pain that is occurred for the past 2 years.  Patient reports that she is currently taking a steroid taper and a muscle relaxer.  She had a MRI of her cervical spine last year and which revealed some mild degenerative changes but no other abnormality.  Patient reports that the pain was so severe today, she had to leave work early.  She states that she does occasionally have tingling in the left hand and that pain radiates down her left upper extremity with range of motion at the neck.  No new falls or mechanisms of trauma.        Past Medical History:  Diagnosis Date  . Cervical arthritis   . Fibroid, uterine     Patient Active Problem List   Diagnosis Date Noted  . Vaginal bleeding 12/05/2017  . Iron deficiency anemia due to chronic blood loss 12/05/2017    Past Surgical History:  Procedure Laterality Date  . BILATERAL SALPINGECTOMY     age 88  . BREAST BIOPSY Right 12/15/2017   pending path  . FRACTURE SURGERY    . OOPHORECTOMY Left 2015   ruptured cyst    Prior to Admission medications   Medication Sig Start Date End Date Taking? Authorizing Provider  cyclobenzaprine (FLEXERIL) 10 MG tablet Take 1 tablet (10 mg total) by mouth 3 (three) times daily as needed. 04/08/19   Fisher, Linden Dolin, PA-C  predniSONE (STERAPRED UNI-PAK 48 TAB) 10 MG (48) TBPK tablet Take 6 pills for 2 days, 5 pills x 2d, 4 pills x 2d, 3 pills x 2d, 2 pills x 2d, 1 pill x 2d 04/08/19   Versie Starks, PA-C    Allergies Patient has no known allergies.  Family History  Problem Relation Age of Onset  . Heart disease Mother   .  Cancer Father 70       bone marrow  . Heart disease Father   . Breast cancer Maternal Aunt        late 35's    Social History Social History   Tobacco Use  . Smoking status: Current Every Day Smoker    Packs/day: 0.50    Types: Cigarettes  . Smokeless tobacco: Never Used  Substance Use Topics  . Alcohol use: No  . Drug use: Yes    Types: Marijuana     Review of Systems  Constitutional: No fever/chills Eyes: No visual changes. No discharge ENT: No upper respiratory complaints. Cardiovascular: no chest pain. Respiratory: no cough. No SOB. Musculoskeletal: Patient has neck pain.  Skin: Negative for rash, abrasions, lacerations, ecchymosis. Neurological: Negative for headaches, focal weakness or numbness.   ____________________________________________   PHYSICAL EXAM:  VITAL SIGNS: ED Triage Vitals  Enc Vitals Group     BP 04/12/19 1407 119/78     Pulse Rate 04/12/19 1407 96     Resp 04/12/19 1407 16     Temp 04/12/19 1407 98.5 F (36.9 C)     Temp Source 04/12/19 1407 Oral     SpO2 04/12/19 1407 97 %     Weight 04/12/19 1408 152 lb (68.9 kg)     Height 04/12/19 1408 5\' 4"  (1.626 m)  Head Circumference --      Peak Flow --      Pain Score 04/12/19 1436 10     Pain Loc --      Pain Edu? --      Excl. in [place]? --      Constitutional: Alert and oriented. Well appearing and in no acute distress. Eyes: Conjunctivae are normal. PERRL. EOMI. Head: Atraumatic. ENT:      Ears:       Nose: No congestion/rhinnorhea.      Mouth/Throat: Mucous membranes are moist.  Neck: No stridor.  Patient has pain with range of motion testing at the neck. Cardiovascular: Normal rate, regular rhythm. Normal S1 and S2.  Good peripheral circulation. Respiratory: Normal respiratory effort without tachypnea or retractions. Lungs CTAB. Good air entry to the bases with no decreased or absent breath sounds. Gastrointestinal: Bowel sounds 4 quadrants. Soft and nontender to palpation.  No guarding or rigidity. No palpable masses. No distention. No CVA tenderness. Musculoskeletal: Full range of motion to all extremities. No gross deformities appreciated. Neurologic:  Normal speech and language. No gross focal neurologic deficits are appreciated.  Skin:  Skin is warm, dry and intact. No rash noted. Psychiatric: Mood and affect are normal. Speech and behavior are normal. Patient exhibits appropriate insight and judgement.   ____________________________________________   LABS (all labs ordered are listed, but only abnormal results are displayed)  Labs Reviewed - No data to display ____________________________________________  EKG   ____________________________________________  RADIOLOGY  No results found.  ____________________________________________    PROCEDURES  Procedure(s) performed:    Procedures    Medications - No data to display   ____________________________________________   INITIAL IMPRESSION / ASSESSMENT AND PLAN / ED COURSE  Pertinent labs & imaging results that were available during my care of the patient were reviewed by me and considered in my medical decision making (see chart for details).  Review of the [place] CSRS was performed in accordance of the [place] prior to dispensing any controlled drugs.         Assessment and plan Shoulder pain 43 year old female presents to the emergency department with acute left shoulder pain worsened with range of motion testing at the neck.  I reviewed patient's MRI findings from 1 year ago.  Patient assured me that she has a follow-up appointment with neurosurgery but states that she would like work note for today and tomorrow.  She plans on continuing steroid taper and muscle relaxer at home.  She declined tramadol offered during this emergency department encounter.  Patient was advised to keep appointment with neurosurgery.  Return precautions were given.  All patient questions were  answered.   ____________________________________________  FINAL CLINICAL IMPRESSION(S) / ED DIAGNOSES  Final diagnoses:  Acute pain of left shoulder      NEW MEDICATIONS STARTED DURING THIS VISIT:  ED Discharge Orders    None          This chart was dictated using voice recognition software/Dragon. Despite best efforts to proofread, errors can occur which can change the meaning. Any change was purely unintentional.    Lannie Fields, PA-C 04/12/19 1721    Nena Polio, MD 04/13/19 2134

## 2019-04-12 NOTE — ED Triage Notes (Signed)
Pt reports was dx'd with frozen shoulder this past weekend and reports the pain is no better. Pt states called ortho MD and was put on a priority list for them to call with an appointment but noone has called yet. Pt reports pain is not bearable.

## 2019-05-12 ENCOUNTER — Encounter: Payer: Self-pay | Admitting: Emergency Medicine

## 2019-05-12 ENCOUNTER — Emergency Department
Admission: EM | Admit: 2019-05-12 | Discharge: 2019-05-12 | Disposition: A | Discharge status: Home / Self Care | Payer: Self-pay | Attending: Emergency Medicine | Admitting: Emergency Medicine

## 2019-05-12 ENCOUNTER — Emergency Department: Payer: Self-pay

## 2019-05-12 ENCOUNTER — Other Ambulatory Visit: Payer: Self-pay

## 2019-05-12 DIAGNOSIS — Z79899 Other long term (current) drug therapy: Secondary | ICD-10-CM | POA: Insufficient documentation

## 2019-05-12 DIAGNOSIS — M25552 Pain in left hip: Secondary | ICD-10-CM

## 2019-05-12 DIAGNOSIS — M4306 Spondylolysis, lumbar region: Secondary | ICD-10-CM | POA: Insufficient documentation

## 2019-05-12 DIAGNOSIS — F17210 Nicotine dependence, cigarettes, uncomplicated: Secondary | ICD-10-CM | POA: Insufficient documentation

## 2019-05-12 LAB — POCT PREGNANCY, URINE: Preg Test, Ur: NEGATIVE

## 2019-05-12 MED ORDER — KETOROLAC TROMETHAMINE 60 MG/2ML IM SOLN
60.0000 mg | Freq: Once | INTRAMUSCULAR | Status: AC
  Administered 2019-05-12: 10:00:00 60 mg via INTRAMUSCULAR
  Filled 2019-05-12: qty 2

## 2019-05-12 MED ORDER — CYCLOBENZAPRINE HCL 10 MG PO TABS: 10.0000 mg | ORAL_TABLET | Freq: Three times a day (TID) | ORAL | 0 refills | Status: DC | PRN

## 2019-05-12 MED ORDER — LIDOCAINE 5 % EX PTCH
1.0000 | MEDICATED_PATCH | CUTANEOUS | Status: DC
  Administered 2019-05-12: 1 via TRANSDERMAL
  Filled 2019-05-12: qty 1

## 2019-05-12 MED ORDER — IBUPROFEN 600 MG PO TABS: 600.0000 mg | ORAL_TABLET | Freq: Three times a day (TID) | ORAL | 0 refills | Status: DC | PRN

## 2019-05-12 MED ORDER — OXYCODONE-ACETAMINOPHEN 7.5-325 MG PO TABS: 1.0000 | ORAL_TABLET | Freq: Four times a day (QID) | ORAL | 0 refills | Status: DC | PRN

## 2019-05-12 NOTE — ED Notes (Addendum)
Pt with c/o of left hip pain that is sharp and constant. Pt ambulatory with difficulty. 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 Pt denies injury.

## 2019-05-12 NOTE — ED Provider Notes (Signed)
[facility] Emergency Department Provider Note   ____________________________________________   First MD Initiated Contact with Patient 05/12/19 [phone]     (approximate)  I have reviewed the triage vital signs and the nursing notes.   HISTORY  Chief Complaint Hip Pain    HPI Barbara Arellano is a 43 y.o. female patient complain of posterior left hip pain for 2 days.  Patient states the only provocative incident is that she has 2 jobs that require prolonged standing.  Patient the pain increased with weightbearing and sitting down.  Patient denies bladder bowel dysfunction.  Patient rates the pain as a 10/10.  Patient described the pain as "sharp".  No palliative measure for complaint.         Past Medical History:  Diagnosis Date  . Cervical arthritis   . Fibroid, uterine     Patient Active Problem List   Diagnosis Date Noted  . Vaginal bleeding 12/05/2017  . Iron deficiency anemia due to chronic blood loss 12/05/2017    Past Surgical History:  Procedure Laterality Date  . BILATERAL SALPINGECTOMY     age 46  . BREAST BIOPSY Right 12/15/2017   pending path  . FRACTURE SURGERY    . OOPHORECTOMY Left 2015   ruptured cyst    Prior to Admission medications   Medication Sig Start Date End Date Taking? Authorizing Provider  cyclobenzaprine (FLEXERIL) 10 MG tablet Take 1 tablet (10 mg total) by mouth 3 (three) times daily as needed. 04/08/19   Fisher, Linden Dolin, PA-C  cyclobenzaprine (FLEXERIL) 10 MG tablet Take 1 tablet (10 mg total) by mouth 3 (three) times daily as needed. 05/12/19   Sable Feil, PA-C  ibuprofen (ADVIL) 600 MG tablet Take 1 tablet (600 mg total) by mouth every 8 (eight) hours as needed. 05/12/19   Sable Feil, PA-C  oxyCODONE-acetaminophen (PERCOCET) 7.5-325 MG tablet Take 1 tablet by mouth every 6 (six) hours as needed. 05/12/19   Sable Feil, PA-C  predniSONE (STERAPRED UNI-PAK 48 TAB) 10 MG (48) TBPK tablet Take 6 pills  for 2 days, 5 pills x 2d, 4 pills x 2d, 3 pills x 2d, 2 pills x 2d, 1 pill x 2d 04/08/19   Versie Starks, PA-C    Allergies Patient has no known allergies.  Family History  Problem Relation Age of Onset  . Heart disease Mother   . Cancer Father 8       bone marrow  . Heart disease Father   . Breast cancer Maternal Aunt        late 29's    Social History Social History   Tobacco Use  . Smoking status: Current Every Day Smoker    Packs/day: 0.50    Types: Cigarettes  . Smokeless tobacco: Never Used  Substance Use Topics  . Alcohol use: No  . Drug use: Yes    Types: Marijuana    Review of Systems Constitutional: No fever/chills Eyes: No visual changes. ENT: No sore throat. Cardiovascular: Denies chest pain. Respiratory: Denies shortness of breath. Gastrointestinal: No abdominal pain.  No nausea, no vomiting.  No diarrhea.  No constipation. Genitourinary: Negative for dysuria. Musculoskeletal: Left lateral back and hip pain. Skin: Negative for rash. Neurological: Negative for headaches, focal weakness or numbness.   ____________________________________________   PHYSICAL EXAM:  VITAL SIGNS: ED Triage Vitals  Enc Vitals Group     BP 05/12/19 0932 119/68     Pulse Rate 05/12/19 0932 95  Resp 05/12/19 0932 18     Temp 05/12/19 0932 98.4 F (36.9 C)     Temp Source 05/12/19 0932 Oral     SpO2 05/12/19 0932 98 %     Weight 05/12/19 0935 170 lb (77.1 kg)     Height 05/12/19 0935 5\' 4"  (1.626 m)     Head Circumference --      Peak Flow --      Pain Score 05/12/19 0935 10     Pain Loc --      Pain Edu? --      Excl. in [place]? --     Constitutional: Alert and oriented.  Moderate distress.   Cardiovascular: Normal rate, regular rhythm. Grossly normal heart sounds.  Good peripheral circulation. Respiratory: Normal respiratory effort.  No retractions. Lungs CTAB. Gastrointestinal: Soft and nontender. No distention. No abdominal bruits. No CVA tenderness.  Genitourinary: Deferred. Musculoskeletal: Patient prefers to plan up.  No obvious spinal deformity.  Patient has some moderate guarding palpation posterior hip.  Patient has atypical gait.   Neurologic:  Normal speech and language. No gross focal neurologic deficits are appreciated. No gait instability. Skin:  Skin is warm, dry and intact. No rash noted. Psychiatric: Mood and affect are normal. Speech and behavior are normal.  ____________________________________________   LABS (all labs ordered are listed, but only abnormal results are displayed)  Labs Reviewed  POC URINE PREG, ED  POCT PREGNANCY, URINE   ____________________________________________  EKG   ____________________________________________  RADIOLOGY  ED MD interpretation:    Official radiology report(s): Dg Lumbar Spine 2-3 Views  Result Date: 05/12/2019 CLINICAL DATA:  Left hip pain for 3 days. EXAM: LUMBAR SPINE - 2-3 VIEW COMPARISON:  CT abdomen pelvis 09/21/2017. FINDINGS: Grade 1 anterolisthesis of L5 on S1 secondary to bilateral L5 pars defects. Relative preservation of the vertebral body heights. L5-S1 degenerative disc disease. SI joints are unremarkable. IMPRESSION: Grade 1 anterolisthesis L5-S1 secondary to bilateral L5 pars defects. L5-S1 degenerative disc disease. Electronically Signed   By: Lovey Newcomer M.D.   On: 05/12/2019 11:04   Dg Hip Unilat W Or Wo Pelvis 2-3 Views Left  Result Date: 05/12/2019 CLINICAL DATA:  Pt with c/o of left hip pain that is sharp and constant x 3 days. Pt ambulatory with difficulty. 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 Pt denies injury. EXAM: DG HIP (WITH OR WITHOUT PELVIS) 2-3V LEFT COMPARISON:  CT abdomen pelvis 09/21/2017 FINDINGS: There is no evidence of hip fracture or dislocation. There is no evidence of arthropathy or other focal bone abnormality. IMPRESSION: Negative left hip radiographs. Electronically Signed   By: Audie Pinto M.D.   On: 05/12/2019  11:02    ____________________________________________   PROCEDURES  Procedure(s) performed (including Critical Care):  Procedures   ____________________________________________   INITIAL IMPRESSION / ASSESSMENT AND PLAN / ED COURSE  As part of my medical decision making, I reviewed the following data within the [place] was evaluated in Emergency Department on 05/12/2019 for the symptoms described in the history of present illness. She was evaluated in the context of the global COVID-19 pandemic, which necessitated consideration that the patient might be at risk for infection with the SARS-CoV-2 virus that causes COVID-19. Institutional protocols and algorithms that pertain to the evaluation of patients at risk for COVID-19 are in a state of rapid change based on information released by regulatory bodies including the CDC and  federal and state organizations. These policies and algorithms were followed during the patient's care in the ED.  Patient complain of back and left hip pain for 2 days.  His exam is consistent with musculoskeletal pain.  Discussed x-ray findings with patient which shows degenerative change in lumbar spine.  Patient given discharge care instruction work note.  Patient vies take medication as directed and follow-up orthopedic for definitive evaluation and treatment.     ____________________________________________   FINAL CLINICAL IMPRESSION(S) / ED DIAGNOSES  Final diagnoses:  Pars defect of lumbar spine  Left hip pain     ED Discharge Orders         Ordered    oxyCODONE-acetaminophen (PERCOCET) 7.5-325 MG tablet  Every 6 hours PRN     05/12/19 1119    cyclobenzaprine (FLEXERIL) 10 MG tablet  3 times daily PRN     05/12/19 1119    ibuprofen (ADVIL) 600 MG tablet  Every 8 hours PRN     05/12/19 1119           Note:  This document was prepared using Dragon voice recognition software and may include  unintentional dictation errors.    Sable Feil, PA-C 05/12/19 [place]    Carrie Mew, MD 05/12/19 [phone]

## 2019-05-12 NOTE — Discharge Instructions (Signed)
Follow discharge care instruction.  Take medication as directed.  Be advised of drowsy effects of the medications.  Follow-up orthopedics by calling for an appointment on Monday morning.

## 2019-08-02 ENCOUNTER — Emergency Department: Payer: Self-pay

## 2019-08-02 ENCOUNTER — Emergency Department
Admission: EM | Admit: 2019-08-02 | Discharge: 2019-08-02 | Disposition: A | Discharge status: Home / Self Care | Payer: Self-pay | Attending: Emergency Medicine | Admitting: Emergency Medicine

## 2019-08-02 ENCOUNTER — Encounter: Payer: Self-pay | Admitting: Emergency Medicine

## 2019-08-02 ENCOUNTER — Other Ambulatory Visit: Payer: Self-pay

## 2019-08-02 DIAGNOSIS — F17210 Nicotine dependence, cigarettes, uncomplicated: Secondary | ICD-10-CM | POA: Insufficient documentation

## 2019-08-02 DIAGNOSIS — M778 Other enthesopathies, not elsewhere classified: Secondary | ICD-10-CM | POA: Insufficient documentation

## 2019-08-02 MED ORDER — IBUPROFEN 600 MG PO TABS: 600.0000 mg | ORAL_TABLET | Freq: Three times a day (TID) | ORAL | 0 refills | Status: DC | PRN

## 2019-08-02 MED ORDER — OXYCODONE-ACETAMINOPHEN 5-325 MG PO TABS
1.0000 | ORAL_TABLET | Freq: Once | ORAL | Status: AC
  Administered 2019-08-02: 1 via ORAL
  Filled 2019-08-02: qty 1

## 2019-08-02 MED ORDER — LIDOCAINE 5 % EX PTCH
1.0000 | MEDICATED_PATCH | CUTANEOUS | Status: DC
  Administered 2019-08-02: 18:00:00 1 via TRANSDERMAL
  Filled 2019-08-02: qty 1

## 2019-08-02 MED ORDER — OXYCODONE-ACETAMINOPHEN 5-325 MG PO TABS: 1.0000 | ORAL_TABLET | Freq: Four times a day (QID) | ORAL | 0 refills | Status: AC | PRN

## 2019-08-02 MED ORDER — IBUPROFEN 600 MG PO TABS
600.0000 mg | ORAL_TABLET | Freq: Once | ORAL | Status: AC
  Administered 2019-08-02: 600 mg via ORAL
  Filled 2019-08-02: qty 1

## 2019-08-02 NOTE — ED Triage Notes (Signed)
Patient presents to the ED with right shoulder pain since Friday night when she reached for something and heard a "pop".

## 2019-08-02 NOTE — ED Provider Notes (Signed)
[facility] Emergency Department Provider Note   ____________________________________________   First MD Initiated Contact with Patient 08/02/19 1614     (approximate)  I have reviewed the triage vital signs and the nursing notes.   HISTORY  Chief Complaint Shoulder Pain    HPI Barbara Arellano is a 44 y.o. female patient complains of 6 days of right shoulder pain secondary to overhead lifting incident 6 days ago.  Patient states she felt a "pop" when reaching overhead.  Patient states continued aching pain when using the shoulder.  Patient rates the pain as 8/10.  No relief with over-the-counter anti-inflammatory medications.  Patient is right-hand dominant.         Past Medical History:  Diagnosis Date  . Cervical arthritis   . Fibroid, uterine     Patient Active Problem List   Diagnosis Date Noted  . Vaginal bleeding 12/05/2017  . Iron deficiency anemia due to chronic blood loss 12/05/2017    Past Surgical History:  Procedure Laterality Date  . BILATERAL SALPINGECTOMY     age 65  . BREAST BIOPSY Right 12/15/2017   pending path  . FRACTURE SURGERY    . OOPHORECTOMY Left 2015   ruptured cyst    Prior to Admission medications   Medication Sig Start Date End Date Taking? Authorizing Provider  cyclobenzaprine (FLEXERIL) 10 MG tablet Take 1 tablet (10 mg total) by mouth 3 (three) times daily as needed. 04/08/19   Fisher, Linden Dolin, PA-C  ibuprofen (ADVIL) 600 MG tablet Take 1 tablet (600 mg total) by mouth every 8 (eight) hours as needed. 08/02/19   Sable Feil, PA-C  oxyCODONE-acetaminophen (PERCOCET) 5-325 MG tablet Take 1 tablet by mouth every 6 (six) hours as needed for up to 3 days for severe pain. 08/02/19 08/05/19  Sable Feil, PA-C  predniSONE (STERAPRED UNI-PAK 48 TAB) 10 MG (48) TBPK tablet Take 6 pills for 2 days, 5 pills x 2d, 4 pills x 2d, 3 pills x 2d, 2 pills x 2d, 1 pill x 2d 04/08/19   Versie Starks, PA-C    Allergies  Patient has no known allergies.  Family History  Problem Relation Age of Onset  . Heart disease Mother   . Cancer Father 45       bone marrow  . Heart disease Father   . Breast cancer Maternal Aunt        late 41's    Social History Social History   Tobacco Use  . Smoking status: Current Every Day Smoker    Packs/day: 0.50    Types: Cigarettes  . Smokeless tobacco: Never Used  Substance Use Topics  . Alcohol use: No  . Drug use: Yes    Types: Marijuana    Review of Systems Constitutional: No fever/chills Eyes: No visual changes. ENT: No sore throat. Cardiovascular: Denies chest pain. Respiratory: Denies shortness of breath. Gastrointestinal: No abdominal pain.  No nausea, no vomiting.  No diarrhea.  No constipation. Genitourinary: Negative for dysuria. Musculoskeletal: Right superior shoulder pain.. Skin: Negative for rash. Neurological: Negative for headaches, focal weakness or numbness.  ____________________________________________   PHYSICAL EXAM:  VITAL SIGNS: ED Triage Vitals  Enc Vitals Group     BP 08/02/19 1606 111/74     Pulse Rate 08/02/19 1606 72     Resp 08/02/19 1606 16     Temp 08/02/19 1606 98.7 F (37.1 C)     Temp Source 08/02/19 1606 Oral  SpO2 08/02/19 1606 98 %     Weight 08/02/19 1607 148 lb (67.1 kg)     Height 08/02/19 1607 5\' 4"  (1.626 m)     Head Circumference --      Peak Flow --      Pain Score 08/02/19 1607 8     Pain Loc --      Pain Edu? --      Excl. in [place]? --     Constitutional: Alert and oriented. Well appearing and in no acute distress. Neck: No cervical spine tenderness to palpation. Hematological/Lymphatic/Immunilogical: No cervical lymphadenopathy. Cardiovascular: Normal rate, regular rhythm. Grossly normal heart sounds.  Good peripheral circulation. Respiratory: Normal respiratory effort.  No retractions. Lungs CTAB. Musculoskeletal: No obvious deformity to the right shoulder.  Patient has moderate guarding  palpation of the supraspinatus area.  Neurologic:  Normal speech and language. No gross focal neurologic deficits are appreciated. No gait instability. Skin:  Skin is warm, dry and intact. No rash noted.  No edema or erythema. Psychiatric: Mood and affect are normal. Speech and behavior are normal.  ____________________________________________   LABS (all labs ordered are listed, but only abnormal results are displayed)  Labs Reviewed - No data to display ____________________________________________  EKG   ____________________________________________  RADIOLOGY  ED MD interpretation:    Official radiology report(s): DG Shoulder Right  Result Date: 08/02/2019 CLINICAL DATA:  Increasing shoulder pain EXAM: RIGHT SHOULDER - 2+ VIEW COMPARISON:  None. FINDINGS: There is no evidence of fracture or dislocation. There is no evidence of arthropathy or other focal bone abnormality. Soft tissues are unremarkable. IMPRESSION: Negative. Electronically Signed   By: Donavan Foil M.D.   On: 08/02/2019 17:39    ____________________________________________   PROCEDURES  Procedure(s) performed (including Critical Care):  Procedures   ____________________________________________   INITIAL IMPRESSION / ASSESSMENT AND PLAN / ED COURSE  As part of my medical decision making, I reviewed the following data within the [place]     Patient presents with 6 days of right shoulder pain.  Discussed neck x-ray findings with patient.  Patient physical exam is consistent with tendinitis.  Patient given discharge care instructions advised take medication as directed.  Patient [facility]    Barbara Arellano was evaluated in Emergency Department on 08/02/2019 for the symptoms described in the history of present illness. She was evaluated in the context of the global COVID-19 pandemic, which necessitated consideration that the patient might be at risk  for infection with the SARS-CoV-2 virus that causes COVID-19. Institutional protocols and algorithms that pertain to the evaluation of patients at risk for COVID-19 are in a state of rapid change based on information released by regulatory bodies including the CDC and federal and state organizations. These policies and algorithms were followed during the patient's care in the ED.       ____________________________________________   FINAL CLINICAL IMPRESSION(S) / ED DIAGNOSES  Final diagnoses:  Tendinitis of right shoulder     ED Discharge Orders         Ordered    oxyCODONE-acetaminophen (PERCOCET) 5-325 MG tablet  Every 6 hours PRN     08/02/19 1801    ibuprofen (ADVIL) 600 MG tablet  Every 8 hours PRN     08/02/19 1801           Note:  This document was prepared using Dragon voice recognition software and may include unintentional dictation errors.    Sable Feil,  PA-C 08/02/19 1807    Harvest Dark, MD 08/02/19 1925

## 2019-08-02 NOTE — Discharge Instructions (Signed)
Follow discharge care instruction take medication as directed. °

## 2019-08-11 ENCOUNTER — Other Ambulatory Visit: Payer: Self-pay

## 2019-08-11 ENCOUNTER — Emergency Department
Admission: EM | Admit: 2019-08-11 | Discharge: 2019-08-11 | Disposition: A | Discharge status: Home / Self Care | Payer: Self-pay | Attending: Emergency Medicine | Admitting: Emergency Medicine

## 2019-08-11 ENCOUNTER — Emergency Department: Payer: Self-pay

## 2019-08-11 ENCOUNTER — Encounter: Payer: Self-pay | Admitting: Emergency Medicine

## 2019-08-11 DIAGNOSIS — F17210 Nicotine dependence, cigarettes, uncomplicated: Secondary | ICD-10-CM | POA: Insufficient documentation

## 2019-08-11 DIAGNOSIS — M778 Other enthesopathies, not elsewhere classified: Secondary | ICD-10-CM | POA: Insufficient documentation

## 2019-08-11 MED ORDER — HYDROCORTISONE VALERATE 0.2 % EX OINT: TOPICAL_OINTMENT | CUTANEOUS | 1 refills | Status: DC

## 2019-08-11 MED ORDER — TRAMADOL HCL 50 MG PO TABS: 50.0000 mg | ORAL_TABLET | Freq: Four times a day (QID) | ORAL | 0 refills | Status: DC | PRN

## 2019-08-11 MED ORDER — NAPROXEN 500 MG PO TABS: 500.0000 mg | ORAL_TABLET | Freq: Two times a day (BID) | ORAL | Status: DC

## 2019-08-11 NOTE — ED Provider Notes (Signed)
[facility] Emergency Department Provider Note   ____________________________________________   First MD Initiated Contact with Patient 08/11/19 1405     (approximate)  I have reviewed the triage vital signs and the nursing notes.   HISTORY  Chief Complaint Wrist Pain    HPI Barbara Arellano is a 44 y.o. female patient presents with 2 weeks of nontraumatic right wrist pain.  Patient did pain increased with flexion extension of the wrist.  Patient denies loss of sensation.  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  Patient also was concerned of a rash is only on the right forearm.  Patient stated rash itch.  Patient rates her wrist pain is 8/10.  Patient described the pain as "achy".  No palliative measure either complaint.         Past Medical History:  Diagnosis Date  . Cervical arthritis   . Fibroid, uterine     Patient Active Problem List   Diagnosis Date Noted  . Vaginal bleeding 12/05/2017  . Iron deficiency anemia due to chronic blood loss 12/05/2017    Past Surgical History:  Procedure Laterality Date  . BILATERAL SALPINGECTOMY     age 42  . BREAST BIOPSY Right 12/15/2017   pending path  . FRACTURE SURGERY    . OOPHORECTOMY Left 2015   ruptured cyst    Prior to Admission medications   Medication Sig Start Date End Date Taking? Authorizing Provider  naproxen (NAPROSYN) 500 MG tablet Take 1 tablet (500 mg total) by mouth 2 (two) times daily with a meal. 08/11/19   Sable Feil, PA-C  traMADol (ULTRAM) 50 MG tablet Take 1 tablet (50 mg total) by mouth every 6 (six) hours as needed for moderate pain. 08/11/19   Sable Feil, PA-C    Allergies Patient has no known allergies.  Family History  Problem Relation Age of Onset  . Heart disease Mother   . Cancer Father 84       bone marrow  . Heart disease Father   . Breast cancer Maternal Aunt        late 6's    Social History Social History   Tobacco Use  .  Smoking status: Current Every Day Smoker    Packs/day: 0.50    Types: Cigarettes  . Smokeless tobacco: Never Used  Substance Use Topics  . Alcohol use: No  . Drug use: Yes    Types: Marijuana    Review of Systems Constitutional: No fever/chills Eyes: No visual changes. ENT: No sore throat. Cardiovascular: Denies chest pain. Respiratory: Denies shortness of breath. Gastrointestinal: No abdominal pain.  No nausea, no vomiting.  No diarrhea.  No constipation. Genitourinary: Negative for dysuria. Musculoskeletal: Right wrist pain. Skin: Positive for rash. Neurological: Negative for headaches, focal weakness or numbness.   ____________________________________________   PHYSICAL EXAM:  VITAL SIGNS: ED Triage Vitals  Enc Vitals Group     BP 08/11/19 1401 124/64     Pulse Rate 08/11/19 1401 99     Resp 08/11/19 1401 18     Temp 08/11/19 1401 97.8 F (36.6 C)     Temp Source 08/11/19 1401 Oral     SpO2 08/11/19 1401 99 %     Weight 08/11/19 1402 153 lb (69.4 kg)     Height 08/11/19 1402 5\' 4"  (1.626 m)     Head Circumference --      Peak Flow --      Pain Score 08/11/19 1402 8  Pain Loc --      Pain Edu? --      Excl. in [place]? --    Constitutional: Alert and oriented. Well appearing and in no acute distress. Cardiovascular: Normal rate, regular rhythm. Grossly normal heart sounds.  Good peripheral circulation. Respiratory: Normal respiratory effort.  No retractions. Lungs CTAB. Musculoskeletal: No obvious deformity to the right wrist.  Neurologic:  Normal speech and language. No gross focal neurologic deficits are appreciated. No gait instability. Skin:  Skin is warm, dry and intact.  Erythematous vesicular lesions right forearm. Psychiatric: Mood and affect are normal. Speech and behavior are normal.  ____________________________________________   LABS (all labs ordered are listed, but only abnormal results are displayed)  Labs Reviewed - No data to display  ____________________________________________  EKG   ____________________________________________  RADIOLOGY  ED MD interpretation:    Official radiology report(s): DG Wrist Complete Right  Result Date: 08/11/2019 CLINICAL DATA:  Nontraumatic wrist pain EXAM: RIGHT WRIST - COMPLETE 3+ VIEW COMPARISON:  None. FINDINGS: No fracture or dislocation of the right wrist. Lateral view submitted for review is extended, within this limitation, the carpus is normally aligned. Joint spaces are well preserved. Soft tissues are unremarkable. IMPRESSION: No fracture or dislocation of the right wrist. Electronically Signed   By: Eddie Candle M.D.   On: 08/11/2019 15:08    ____________________________________________   PROCEDURES  Procedure(s) performed (including Critical Care):  Procedures   ____________________________________________   INITIAL IMPRESSION / ASSESSMENT AND PLAN / ED COURSE  As part of my medical decision making, I reviewed the following data within the [place]     Patient presents with 2 months of right wrist pain.  Discussed x-ray findings with patient.  Patient feels exam consistent with tendinitis.  Patient given a wrist splint and discharge care instruction.  Patient advised to use [facility].    Barbara Arellano was evaluated in Emergency Department on 08/11/2019 for the symptoms described in the history of present illness. She was evaluated in the context of the global COVID-19 pandemic, which necessitated consideration that the patient might be at risk for infection with the SARS-CoV-2 virus that causes COVID-19. Institutional protocols and algorithms that pertain to the evaluation of patients at risk for COVID-19 are in a state of rapid change based on information released by regulatory bodies including the CDC and federal and state organizations. These policies and algorithms were followed during the patient's care in the  ED.       ____________________________________________   FINAL CLINICAL IMPRESSION(S) / ED DIAGNOSES  Final diagnoses:  Tendinitis of right wrist     ED Discharge Orders         Ordered    naproxen (NAPROSYN) 500 MG tablet  2 times daily with meals     08/11/19 1518    traMADol (ULTRAM) 50 MG tablet  Every 6 hours PRN     08/11/19 1518           Note:  This document was prepared using Dragon voice recognition software and may include unintentional dictation errors.    Sable Feil, PA-C 08/11/19 1520    Earleen Newport, MD 08/11/19 [phone]

## 2019-08-11 NOTE — ED Notes (Signed)
See triage note  Presents with right wrist pain  States pain started couple of months ago  Unsure of injury   Having increased pain with movement  No deformity   Good pulses  Also has a rash to right forearm  States the rash has been there for about 4 months  Positive itching

## 2019-08-11 NOTE — Discharge Instructions (Signed)
Wear wrist splint while working for the next 2 weeks.

## 2019-08-11 NOTE — ED Triage Notes (Signed)
Pt to ED with c/o of right wrist pain that she states started approx 2 months ago but pain has increased.

## 2019-08-11 NOTE — ED Notes (Signed)
Patient refused dressing on arm to protect rash from splint. Patient given supplies for dressing. Patient left with splint in place.

## 2019-12-17 ENCOUNTER — Other Ambulatory Visit: Payer: Self-pay

## 2019-12-17 ENCOUNTER — Emergency Department
Admission: EM | Admit: 2019-12-17 | Discharge: 2019-12-17 | Disposition: A | Discharge status: Home / Self Care | Payer: Self-pay | Attending: Student | Admitting: Student

## 2019-12-17 ENCOUNTER — Emergency Department: Payer: Self-pay

## 2019-12-17 ENCOUNTER — Encounter: Payer: Self-pay | Admitting: Emergency Medicine

## 2019-12-17 DIAGNOSIS — F17210 Nicotine dependence, cigarettes, uncomplicated: Secondary | ICD-10-CM | POA: Insufficient documentation

## 2019-12-17 DIAGNOSIS — Z79899 Other long term (current) drug therapy: Secondary | ICD-10-CM | POA: Insufficient documentation

## 2019-12-17 DIAGNOSIS — M79671 Pain in right foot: Secondary | ICD-10-CM | POA: Insufficient documentation

## 2019-12-17 DIAGNOSIS — M25531 Pain in right wrist: Secondary | ICD-10-CM | POA: Insufficient documentation

## 2019-12-17 MED ORDER — NAPROXEN 500 MG PO TABS: 500.0000 mg | ORAL_TABLET | Freq: Two times a day (BID) | ORAL | 0 refills | Status: AC

## 2019-12-17 MED ORDER — KETOROLAC TROMETHAMINE 30 MG/ML IJ SOLN
30.0000 mg | Freq: Once | INTRAMUSCULAR | Status: AC
  Administered 2019-12-17: 30 mg via INTRAMUSCULAR
  Filled 2019-12-17: qty 1

## 2019-12-17 NOTE — ED Triage Notes (Signed)
C/O right foot pain from heel x 4 months.

## 2019-12-17 NOTE — Discharge Instructions (Signed)
Thank you for letting us take care of you in the emergency department today.  Your x-rays did not show any fractures or breaks.  Use a supportive shoe that has good arch support to help with your pain.  Please continue to take any regular, prescribed medications.   New medications we have prescribed:  Naproxen, to help with pain and inflammation  Please follow up with: Primary care doctor to review your ER visit and follow up on your symptoms.  Information for primary care office as below. Podiatry doctor (specialized foot doctor) - info below   Please return to the ER for any new or worsening symptoms.

## 2019-12-17 NOTE — ED Provider Notes (Signed)
[facility] Emergency Department Provider Note  ____________________________________________   First MD Initiated Contact with Patient 12/17/19 1352     (approximate)  I have reviewed the triage vital signs and the nursing notes.  History  Chief Complaint Foot Pain    HPI Barbara Arellano is a 44 y.o. female presents to the emergency department for right foot pain and right wrist pain.  Her primary complaint is regarding the right foot.  Patient states she has been having pain since February.  This has been constant and progressively worsening, causing her difficulty to bear weight.  She denies any direct injury or trauma.  Pain is located on the plantar surface as well as the lateral surface of the foot.  Describes it like a burning sensation, 8/10.  No radiation.  No apparent alleviating/aggravating components.  No associated swelling, warmth, erythema.  No fevers.  Also complains of some aching to the right wrist.  Patient states she hit her wrist against something when cleaning several weeks ago and has residual soreness, moderate in severity.  No deformity.  No swelling.  No numbness or tingling.   Past Medical Hx Past Medical History:  Diagnosis Date  . Cervical arthritis   . Fibroid, uterine     Problem List Patient Active Problem List   Diagnosis Date Noted  . Vaginal bleeding 12/05/2017  . Iron deficiency anemia due to chronic blood loss 12/05/2017    Past Surgical Hx Past Surgical History:  Procedure Laterality Date  . BILATERAL SALPINGECTOMY     age 40  . BREAST BIOPSY Right 12/15/2017   pending path  . FRACTURE SURGERY    . OOPHORECTOMY Left 2015   ruptured cyst    Medications Prior to Admission medications   Medication Sig Start Date End Date Taking? Authorizing Provider  hydrocortisone valerate ointment (WESTCORT) 0.2 % Apply to affected area daily 08/11/19 08/10/20  Sable Feil, PA-C  naproxen (NAPROSYN) 500 MG tablet  Take 1 tablet (500 mg total) by mouth 2 (two) times daily with a meal. 08/11/19   Sable Feil, PA-C  traMADol (ULTRAM) 50 MG tablet Take 1 tablet (50 mg total) by mouth every 6 (six) hours as needed for moderate pain. 08/11/19   Sable Feil, PA-C    Allergies Patient has no known allergies.  Family Hx Family History  Problem Relation Age of Onset  . Heart disease Mother   . Cancer Father 77       bone marrow  . Heart disease Father   . Breast cancer Maternal Aunt        late 25's    Social Hx Social History   Tobacco Use  . Smoking status: Current Every Day Smoker    Packs/day: 0.50    Types: Cigarettes  . Smokeless tobacco: Never Used  Vaping Use  . Vaping Use: Never used  Substance Use Topics  . Alcohol use: No  . Drug use: Yes    Types: Marijuana     Review of Systems  Constitutional: Negative for fever. Negative for chills. Eyes: Negative for visual changes. ENT: Negative for sore throat. Cardiovascular: Negative for chest pain. Respiratory: Negative for shortness of breath. Gastrointestinal: Negative for nausea. Negative for vomiting.  Genitourinary: Negative for dysuria. Musculoskeletal: + R foot and R wrist pain Skin: Negative for rash. Neurological: Negative for headaches.   Physical Exam  Vital Signs: ED Triage Vitals  Enc Vitals Group     BP 12/17/19 1301  121/81     Pulse Rate 12/17/19 1301 77     Resp 12/17/19 1301 18     Temp 12/17/19 1301 98.7 F (37.1 C)     Temp Source 12/17/19 1301 Oral     SpO2 12/17/19 1301 96 %     Weight 12/17/19 1254 153 lb (69.4 kg)     Height 12/17/19 1254 5\' 4"  (1.626 m)     Head Circumference --      Peak Flow --      Pain Score 12/17/19 1254 8     Pain Loc --      Pain Edu? --      Excl. in [place]? --     Constitutional: Alert and oriented. Well appearing. NAD.  Head: Normocephalic. Atraumatic. Eyes: Conjunctivae clear. Sclera anicteric. Pupils equal and symmetric. Nose: No masses or lesions. No  congestion or rhinorrhea. Mouth/Throat: Wearing mask.  Neck: No stridor. Trachea midline.  Cardiovascular: Normal rate, regular rhythm. Extremities well perfused.  Fingers and toes are warm and well-perfused. Respiratory: Normal respiratory effort.   Genitourinary: Deferred. Musculoskeletal:  RUE: No obvious deformity about the wrist.  No swelling, edema, or effusions.  No erythema or warmth.  TTP overlying the ulnar styloid.  Full range of motion.  Motor/sensation intact in radial, ulnar, median distribution.  Compartments soft and compressible. RLE: No obvious deformity about the foot or ankle.  No swelling, edema, effusions.  No erythema or warmth.  TTP to the underlying plantar surface and calcaneus, as well as the lateral aspect of the foot.  Nontender to lateral or medial malleoli or along the Achilles.  Full range of motion at the ankle and knee.  Sensation intact.  Compartments soft and compressible. Neurologic:  Normal speech and language. No gross focal or lateralizing neurologic deficits are appreciated.  Skin: Skin is warm, dry and intact. No rash noted. Psychiatric: Mood and affect are appropriate for situation.   Radiology  Personally reviewed available imaging myself.   XR R wrist - IMPRESSION:  Negative.   XR R foot - IMPRESSION:  Minimal arthritic changes of the first MTP joint.    Procedures  Procedure(s) performed (including critical care):  Procedures   Initial Impression / Assessment and Plan / MDM / ED Course  44 y.o. female who presents to the ED for chronic atraumatic right foot pain, and persistent right wrist soreness after hitting it against something while cleaning.  Exam fairly unremarkable, as above.  No gross deformities.  No significant swelling, erythema, warmth and full range of motion at both joints - do not suspect infected joint or gout.  Ddx: occult fracture, contusion, sprain, arthritis  Will plan for managing, IM Toradol  Imaging  unremarkable, aside from minimal arthritic changes at the first MTP joint of the foot.  As such, feel patient is stable for discharge with outpatient follow-up.  Given referral for PCP office and podiatry, Rx for naproxen for symptom control.  Advised wearing supportive shoes with good arch support.  Patient voices understanding and is comfortable with the plan.   _______________________________   As part of my medical decision making I have reviewed available labs, radiology tests, reviewed old records/performed chart review.   Final Clinical Impression(s) / ED Diagnosis  Final diagnoses:  Right foot pain  Right wrist pain       Note:  This document was prepared using Dragon voice recognition software and may include unintentional dictation errors.   Lilia Pro., MD 12/17/19 1501

## 2020-01-16 ENCOUNTER — Telehealth: Payer: Self-pay | Admitting: General Practice

## 2020-01-16 NOTE — Telephone Encounter (Signed)
Individual has been contacted 3+ times regarding ED referral. No further attempts to contact individual will be made. 

## 2020-01-17 ENCOUNTER — Emergency Department
Admission: EM | Admit: 2020-01-17 | Discharge: 2020-01-17 | Disposition: A | Discharge status: Home / Self Care | Payer: Self-pay | Attending: Emergency Medicine | Admitting: Emergency Medicine

## 2020-01-17 ENCOUNTER — Other Ambulatory Visit: Payer: Self-pay

## 2020-01-17 DIAGNOSIS — F17210 Nicotine dependence, cigarettes, uncomplicated: Secondary | ICD-10-CM | POA: Insufficient documentation

## 2020-01-17 DIAGNOSIS — M722 Plantar fascial fibromatosis: Secondary | ICD-10-CM | POA: Insufficient documentation

## 2020-01-17 MED ORDER — PREDNISONE 10 MG (21) PO TBPK: ORAL_TABLET | ORAL | 0 refills | Status: DC

## 2020-01-17 MED ORDER — HYDROCODONE-ACETAMINOPHEN 5-325 MG PO TABS: 1.0000 | ORAL_TABLET | Freq: Four times a day (QID) | ORAL | 0 refills | Status: AC | PRN

## 2020-01-17 NOTE — ED Triage Notes (Signed)
Right foot pain ongoing X 6 months with movement. Was seen in ED X 1 month ago with no answers to why it is painful.

## 2020-01-17 NOTE — ED Provider Notes (Signed)
[facility] Emergency Department Provider Note ____________________________________________  Time seen: Approximately 1:06 PM  I have reviewed the triage vital signs and the nursing notes.   HISTORY  Chief Complaint Foot Pain    HPI Barbara Arellano is a 44 y.o. female who presents to the emergency department for evaluation and treatment of right foot pain. Symptoms started about 6 months ago and are progressively worsening. She was evaluated here and prescribed NSAIDs which have not helped. She has also tried shoe inserts and taping. She has an appointment with podiatry at the end of the month.   Past Medical History:  Diagnosis Date  . Cervical arthritis   . Fibroid, uterine     Patient Active Problem List   Diagnosis Date Noted  . Vaginal bleeding 12/05/2017  . Iron deficiency anemia due to chronic blood loss 12/05/2017    Past Surgical History:  Procedure Laterality Date  . BILATERAL SALPINGECTOMY     age 25  . BREAST BIOPSY Right 12/15/2017   pending path  . FRACTURE SURGERY    . OOPHORECTOMY Left 2015   ruptured cyst    Prior to Admission medications   Medication Sig Start Date End Date Taking? Authorizing Provider  HYDROcodone-acetaminophen (NORCO/VICODIN) 5-325 MG tablet Take 1 tablet by mouth every 6 (six) hours as needed for up to 3 days for severe pain. 01/17/20 01/20/20  Victorino Dike, FNP  hydrocortisone valerate ointment (WESTCORT) 0.2 % Apply to affected area daily 08/11/19 08/10/20  Sable Feil, PA-C  naproxen (NAPROSYN) 500 MG tablet Take 1 tablet (500 mg total) by mouth 2 (two) times daily with a meal. 08/11/19   Sable Feil, PA-C  predniSONE (STERAPRED UNI-PAK 21 TAB) 10 MG (21) TBPK tablet Take 6 tablets on the first day and decrease by 1 tablet each day until finished. 01/17/20   Victorino Dike, FNP    Allergies Patient has no known allergies.  Family History  Problem Relation Age of Onset  . Heart disease Mother    . Cancer Father 4       bone marrow  . Heart disease Father   . Breast cancer Maternal Aunt        late 84's    Social History Social History   Tobacco Use  . Smoking status: Current Every Day Smoker    Packs/day: 0.50    Types: Cigarettes  . Smokeless tobacco: Never Used  Vaping Use  . Vaping Use: Never used  Substance Use Topics  . Alcohol use: No  . Drug use: Yes    Types: Marijuana    Review of Systems Constitutional: Negative for fever. Cardiovascular: Negative for chest pain. Respiratory: Negative for shortness of breath. Musculoskeletal: Positive for right foot pain. Skin: Negative for open wound or lesion.  Neurological: Negative for decrease in sensation  ____________________________________________   PHYSICAL EXAM:  VITAL SIGNS: ED Triage Vitals  Enc Vitals Group     BP 01/17/20 1108 112/76     Pulse Rate 01/17/20 1108 83     Resp 01/17/20 1108 16     Temp 01/17/20 1108 98.2 F (36.8 C)     Temp Source 01/17/20 1108 Oral     SpO2 01/17/20 1108 100 %     Weight 01/17/20 1109 168 lb (76.2 kg)     Height 01/17/20 1109 5\' 4"  (1.626 m)     Head Circumference --      Peak Flow --      Pain  Score 01/17/20 1109 10     Pain Loc --      Pain Edu? --      Excl. in [place]? --     Constitutional: Alert and oriented. Well appearing and in no acute distress. Eyes: Conjunctivae are clear without discharge or drainage Head: Atraumatic Neck: Supple Respiratory: No cough. Respirations are even and unlabored. Musculoskeletal: Tenderness over the arch and heel. No pain in the tarsal tunnel area. Neurologic: Motor and sensory function intact. No radiculopathy.  Skin: No open wounds or lesions over the right foot or lower extremity.  Psychiatric: Affect and behavior are appropriate.  ____________________________________________   LABS (all labs ordered are listed, but only abnormal results are displayed)  Labs Reviewed - No data to  display ____________________________________________  RADIOLOGY  Not indicated.  I, Sherrie George, personally viewed and evaluated these images (plain radiographs) as part of my medical decision making, as well as reviewing the written report by the radiologist.  No results found. ____________________________________________   PROCEDURES  Procedures  ____________________________________________   INITIAL IMPRESSION / ASSESSMENT AND PLAN / ED COURSE  CECELIA GRACIANO is a 44 y.o. who presents to the emergency department for presents to the emergency department for treatment and evaluation of right foot pain.  Symptoms and exam are most consistent with plantar fasciitis.  Patient was advised to purchase a night splint, perform stretches, wear the brace applied today, take medication as prescribed, and see Dr. Cleda Mccreedy as scheduled.  Medications - No data to display  Pertinent labs & imaging results that were available during my care of the patient were reviewed by me and considered in my medical decision making (see chart for details).   _________________________________________   FINAL CLINICAL IMPRESSION(S) / ED DIAGNOSES  Final diagnoses:  Plantar fasciitis, right    ED Discharge Orders         Ordered    predniSONE (STERAPRED UNI-PAK 21 TAB) 10 MG (21) TBPK tablet     Discontinue  Reprint     01/17/20 1208    HYDROcodone-acetaminophen (NORCO/VICODIN) 5-325 MG tablet  Every 6 hours PRN     Discontinue  Reprint     01/17/20 1208           If controlled substance prescribed during this visit, 12 month history viewed on the [place] prior to issuing an initial prescription for Schedule II or III opiod.   Victorino Dike, FNP 01/17/20 1352    Earleen Newport, MD 01/17/20 1504

## 2020-01-17 NOTE — Discharge Instructions (Signed)
Purchase a Night Splint and wear nightly.  Do not go barefoot.  Do not take ibuprofen while taking the prednisone.

## 2020-02-19 ENCOUNTER — Emergency Department
Admission: EM | Admit: 2020-02-19 | Discharge: 2020-02-19 | Disposition: A | Discharge status: Home / Self Care | Payer: Self-pay | Attending: Emergency Medicine | Admitting: Emergency Medicine

## 2020-02-19 ENCOUNTER — Other Ambulatory Visit: Payer: Self-pay

## 2020-02-19 DIAGNOSIS — Z79899 Other long term (current) drug therapy: Secondary | ICD-10-CM | POA: Insufficient documentation

## 2020-02-19 DIAGNOSIS — F17210 Nicotine dependence, cigarettes, uncomplicated: Secondary | ICD-10-CM | POA: Insufficient documentation

## 2020-02-19 DIAGNOSIS — R109 Unspecified abdominal pain: Secondary | ICD-10-CM | POA: Insufficient documentation

## 2020-02-19 LAB — CBC
HCT: 38.1 % (ref 36.0–46.0)
Hemoglobin: 13.4 g/dL (ref 12.0–15.0)
MCH: 33.2 pg (ref 26.0–34.0)
MCHC: 35.2 g/dL (ref 30.0–36.0)
MCV: 94.3 fL (ref 80.0–100.0)
Platelets: 241 10*3/uL (ref 150–400)
RBC: 4.04 MIL/uL (ref 3.87–5.11)
RDW: 12.2 % (ref 11.5–15.5)
WBC: 9.6 10*3/uL (ref 4.0–10.5)
nRBC: 0 % (ref 0.0–0.2)

## 2020-02-19 LAB — COMPREHENSIVE METABOLIC PANEL
ALT: 17 U/L (ref 0–44)
AST: 22 U/L (ref 15–41)
Albumin: 4.2 g/dL (ref 3.5–5.0)
Alkaline Phosphatase: 88 U/L (ref 38–126)
Anion gap: 8 (ref 5–15)
BUN: 11 mg/dL (ref 6–20)
CO2: 25 mmol/L (ref 22–32)
Calcium: 9.1 mg/dL (ref 8.9–10.3)
Chloride: 102 mmol/L (ref 98–111)
Creatinine, Ser: 0.64 mg/dL (ref 0.44–1.00)
GFR calc Af Amer: >60 mL/min (ref >60)
GFR calc non Af Amer: >60 mL/min (ref >60)
Glucose, Bld: 94 mg/dL (ref 70–99)
Potassium: 3.7 mmol/L (ref 3.5–5.1)
Sodium: 135 mmol/L (ref 135–145)
Total Bilirubin: 0.6 mg/dL (ref 0.3–1.2)
Total Protein: 7.7 g/dL (ref 6.5–8.1)

## 2020-02-19 LAB — URINALYSIS, COMPLETE (UACMP) WITH MICROSCOPIC
Bilirubin Urine: NEGATIVE
Glucose, UA: NEGATIVE mg/dL
Hgb urine dipstick: NEGATIVE
Ketones, ur: NEGATIVE mg/dL
Nitrite: NEGATIVE
Protein, ur: NEGATIVE mg/dL
Specific Gravity, Urine: 1.018 (ref 1.005–1.030)
pH: 6 (ref 5.0–8.0)

## 2020-02-19 LAB — LIPASE, BLOOD: Lipase: 32 U/L (ref 11–51)

## 2020-02-19 LAB — POCT PREGNANCY, URINE: Preg Test, Ur: NEGATIVE

## 2020-02-19 MED ORDER — DICYCLOMINE HCL 10 MG PO CAPS: 10.0000 mg | ORAL_CAPSULE | Freq: Four times a day (QID) | ORAL | 0 refills | Status: DC

## 2020-02-19 NOTE — ED Provider Notes (Signed)
[facility] Emergency Department Provider Note   ____________________________________________    I have reviewed the triage vital signs and the nursing notes.   HISTORY  Chief Complaint Abdominal Pain     HPI Barbara Arellano is a 44 y.o. female with a history of bilateral salpingectomy, left-sided nephrectomy who presents with complaints of left lower quadrant abdominal cramping over the last 2 to 3 weeks.  She reports this has been mild cramping.  Denies dysuria, no hematuria.  No fevers or chills.  Has had some runny stools.  Has not take anything for this.  No sick contacts reported.  No history of diverticulitis.  No vaginal discharge or bleeding.  Past Medical History:  Diagnosis Date  . Cervical arthritis   . Fibroid, uterine     Patient Active Problem List   Diagnosis Date Noted  . Vaginal bleeding 12/05/2017  . Iron deficiency anemia due to chronic blood loss 12/05/2017    Past Surgical History:  Procedure Laterality Date  . BILATERAL SALPINGECTOMY     age 32  . BREAST BIOPSY Right 12/15/2017   pending path  . FRACTURE SURGERY    . OOPHORECTOMY Left 2015   ruptured cyst    Prior to Admission medications   Medication Sig Start Date End Date Taking? Authorizing Provider  dicyclomine (BENTYL) 10 MG capsule Take 1 capsule (10 mg total) by mouth 4 (four) times daily for 14 days. 02/19/20 03/04/20  Lavonia Drafts, MD  hydrocortisone valerate ointment (WESTCORT) 0.2 % Apply to affected area daily 08/11/19 08/10/20  Sable Feil, PA-C  naproxen (NAPROSYN) 500 MG tablet Take 1 tablet (500 mg total) by mouth 2 (two) times daily with a meal. 08/11/19   Sable Feil, PA-C  predniSONE (STERAPRED UNI-PAK 21 TAB) 10 MG (21) TBPK tablet Take 6 tablets on the first day and decrease by 1 tablet each day until finished. 01/17/20   Victorino Dike, FNP     Allergies Patient has no known allergies.  Family History  Problem Relation Age of Onset    . Heart disease Mother   . Cancer Father 74       bone marrow  . Heart disease Father   . Breast cancer Maternal Aunt        late 74's    Social History Social History   Tobacco Use  . Smoking status: Current Every Day Smoker    Packs/day: 0.50    Types: Cigarettes  . Smokeless tobacco: Never Used  Vaping Use  . Vaping Use: Never used  Substance Use Topics  . Alcohol use: No  . Drug use: Yes    Types: Marijuana    Review of Systems  Constitutional: No fever/chills Eyes: No visual changes.  ENT: No sore throat. Cardiovascular: Denies chest pain. Respiratory: Denies shortness of breath. Gastrointestinal: As above Genitourinary: As above Musculoskeletal: Negative for back pain. Skin: Negative for rash. Neurological: Negative for headaches   ____________________________________________   PHYSICAL EXAM:  VITAL SIGNS: ED Triage Vitals  Enc Vitals Group     BP 02/19/20 1129 (!) 128/91     Pulse Rate 02/19/20 1129 84     Resp 02/19/20 1129 16     Temp 02/19/20 1129 98.3 F (36.8 C)     Temp Source 02/19/20 1129 Oral     SpO2 02/19/20 1129 96 %     Weight 02/19/20 1129 77.1 kg (170 lb)     Height 02/19/20 1129 1.626 m (5'  4")     Head Circumference --      Peak Flow --      Pain Score 02/19/20 1132 10     Pain Loc --      Pain Edu? --      Excl. in [place]? --     Constitutional: Alert and oriented. No acute distress.  Nose: No congestion/rhinnorhea. Mouth/Throat: Mucous membranes are moist.    Cardiovascular: Normal rate, regular rhythm.  Good peripheral circulation. Respiratory: Normal respiratory effort.  No retractions.  Gastrointestinal: Soft and nontender. No distention.  No CVA tenderness.  Reassuring exam  Musculoskeletal:  Warm and well perfused Neurologic:  Normal speech and language. No gross focal neurologic deficits are appreciated.  Skin:  Skin is warm, dry and intact. No rash noted. Psychiatric: Mood and affect are normal. Speech and  behavior are normal.  ____________________________________________   LABS (all labs ordered are listed, but only abnormal results are displayed)  Labs Reviewed  URINALYSIS, COMPLETE (UACMP) WITH MICROSCOPIC - Abnormal; Notable for the following components:      Result Value   Color, Urine YELLOW (*)    APPearance CLOUDY (*)    Leukocytes,Ua TRACE (*)    Bacteria, UA RARE (*)    All other components within normal limits  LIPASE, BLOOD  COMPREHENSIVE METABOLIC PANEL  CBC  POC URINE PREG, ED  POCT PREGNANCY, URINE   ____________________________________________  EKG  None ____________________________________________  RADIOLOGY  None ____________________________________________   PROCEDURES  Procedure(s) performed: No  Procedures   Critical Care performed: No ____________________________________________   INITIAL IMPRESSION / ASSESSMENT AND PLAN / ED COURSE  Pertinent labs & imaging results that were available during my care of the patient were reviewed by me and considered in my medical decision making (see chart for details).  Patient presents with intermittent cramping left lower quadrant as described above.  Given surgical history, not consistent with ovarian cyst or torsion.  No vaginal discharge or bleeding.  No history of diverticular disease.  Some loose stools, possible colitis, less likely diverticulitis  Lab work is quite reassuring, normal white blood cell count, normal chemistries, normal LFTs.  Urinalysis is unremarkable, no hemoglobin to suggest kidney stone.  Given reassuring exam and presentation, will Rx Bentyl for spasm/cramping, outpatient follow-up recommended, return precautions discussed.    ____________________________________________   FINAL CLINICAL IMPRESSION(S) / ED DIAGNOSES  Final diagnoses:  Abdominal pain, unspecified abdominal location        Note:  This document was prepared using Dragon voice recognition software  and may include unintentional dictation errors.   Lavonia Drafts, MD 02/19/20 [phone]

## 2020-02-19 NOTE — ED Triage Notes (Signed)
Pt c/o lower L abd pain x 2-3 weeks with some R sided abd pain as well. States increase in urination. States when she coughs she pees. Denies N&V&D. A&O, ambulatory. Hx of ovarian cyst and states feels similar. Only 2 periods this year. No ovaries or fallopian tubes per pt.

## 2020-02-28 ENCOUNTER — Other Ambulatory Visit: Payer: Self-pay

## 2020-02-28 ENCOUNTER — Emergency Department
Admission: EM | Admit: 2020-02-28 | Discharge: 2020-02-28 | Disposition: A | Discharge status: Home / Self Care | Payer: Self-pay | Attending: Emergency Medicine | Admitting: Emergency Medicine

## 2020-02-28 DIAGNOSIS — Z5321 Procedure and treatment not carried out due to patient leaving prior to being seen by health care provider: Secondary | ICD-10-CM | POA: Insufficient documentation

## 2020-02-28 DIAGNOSIS — N939 Abnormal uterine and vaginal bleeding, unspecified: Secondary | ICD-10-CM | POA: Insufficient documentation

## 2020-02-28 LAB — CBC WITH DIFFERENTIAL/PLATELET
Abs Immature Granulocytes: 0.04 10*3/uL (ref 0.00–0.07)
Basophils Absolute: 0.1 10*3/uL (ref 0.0–0.1)
Basophils Relative: 1 %
Eosinophils Absolute: 0.4 10*3/uL (ref 0.0–0.5)
Eosinophils Relative: 4 %
HCT: 36.4 % (ref 36.0–46.0)
Hemoglobin: 12.8 g/dL (ref 12.0–15.0)
Immature Granulocytes: 0 %
Lymphocytes Relative: 26 %
Lymphs Abs: 2.4 10*3/uL (ref 0.7–4.0)
MCH: 33.2 pg (ref 26.0–34.0)
MCHC: 35.2 g/dL (ref 30.0–36.0)
MCV: 94.3 fL (ref 80.0–100.0)
Monocytes Absolute: 0.5 10*3/uL (ref 0.1–1.0)
Monocytes Relative: 5 %
Neutro Abs: 6.1 10*3/uL (ref 1.7–7.7)
Neutrophils Relative %: 64 %
Platelets: 240 10*3/uL (ref 150–400)
RBC: 3.86 MIL/uL — ABNORMAL LOW (ref 3.87–5.11)
RDW: 12.1 % (ref 11.5–15.5)
WBC: 9.5 10*3/uL (ref 4.0–10.5)
nRBC: 0 % (ref 0.0–0.2)

## 2020-02-28 LAB — COMPREHENSIVE METABOLIC PANEL
ALT: 17 U/L (ref 0–44)
AST: 24 U/L (ref 15–41)
Albumin: 3.9 g/dL (ref 3.5–5.0)
Alkaline Phosphatase: 86 U/L (ref 38–126)
Anion gap: 7 (ref 5–15)
BUN: 12 mg/dL (ref 6–20)
CO2: 27 mmol/L (ref 22–32)
Calcium: 9.1 mg/dL (ref 8.9–10.3)
Chloride: 103 mmol/L (ref 98–111)
Creatinine, Ser: 0.6 mg/dL (ref 0.44–1.00)
GFR calc Af Amer: >60 mL/min (ref >60)
GFR calc non Af Amer: >60 mL/min (ref >60)
Glucose, Bld: 129 mg/dL — ABNORMAL HIGH (ref 70–99)
Potassium: 3.1 mmol/L — ABNORMAL LOW (ref 3.5–5.1)
Sodium: 137 mmol/L (ref 135–145)
Total Bilirubin: 0.6 mg/dL (ref 0.3–1.2)
Total Protein: 7.3 g/dL (ref 6.5–8.1)

## 2020-02-28 LAB — LIPASE, BLOOD: Lipase: 36 U/L (ref 11–51)

## 2020-02-28 NOTE — ED Triage Notes (Signed)
Pt states she was seen 2 weeks ago for cramping and was placed on a new medication- pt states she was doing okay until this morning when she woke up and was cramping really bad and bleeding really heavy- pt appears very uncomfortable and cannot sit still

## 2020-02-29 ENCOUNTER — Emergency Department: Payer: Self-pay

## 2020-02-29 ENCOUNTER — Encounter: Payer: Self-pay | Admitting: Emergency Medicine

## 2020-02-29 DIAGNOSIS — N83201 Unspecified ovarian cyst, right side: Secondary | ICD-10-CM | POA: Insufficient documentation

## 2020-02-29 DIAGNOSIS — N939 Abnormal uterine and vaginal bleeding, unspecified: Secondary | ICD-10-CM | POA: Insufficient documentation

## 2020-02-29 DIAGNOSIS — N390 Urinary tract infection, site not specified: Secondary | ICD-10-CM | POA: Insufficient documentation

## 2020-02-29 DIAGNOSIS — F17210 Nicotine dependence, cigarettes, uncomplicated: Secondary | ICD-10-CM | POA: Insufficient documentation

## 2020-02-29 DIAGNOSIS — Z79899 Other long term (current) drug therapy: Secondary | ICD-10-CM | POA: Insufficient documentation

## 2020-02-29 DIAGNOSIS — Z3202 Encounter for pregnancy test, result negative: Secondary | ICD-10-CM | POA: Insufficient documentation

## 2020-02-29 LAB — URINALYSIS, ROUTINE W REFLEX MICROSCOPIC
Bacteria, UA: NONE SEEN
Bilirubin Urine: NEGATIVE
Glucose, UA: NEGATIVE mg/dL
Ketones, ur: NEGATIVE mg/dL
Nitrite: NEGATIVE
Protein, ur: 100 mg/dL — AB
RBC / HPF: >50 RBC/hpf — ABNORMAL HIGH (ref 0–5)
Specific Gravity, Urine: 1.017 (ref 1.005–1.030)
pH: 7 (ref 5.0–8.0)

## 2020-02-29 LAB — CBC WITH DIFFERENTIAL/PLATELET
Abs Immature Granulocytes: 0.04 10*3/uL (ref 0.00–0.07)
Basophils Absolute: 0.1 10*3/uL (ref 0.0–0.1)
Basophils Relative: 1 %
Eosinophils Absolute: 0.6 10*3/uL — ABNORMAL HIGH (ref 0.0–0.5)
Eosinophils Relative: 6 %
HCT: 36.5 % (ref 36.0–46.0)
Hemoglobin: 13.2 g/dL (ref 12.0–15.0)
Immature Granulocytes: 0 %
Lymphocytes Relative: 29 %
Lymphs Abs: 3.1 10*3/uL (ref 0.7–4.0)
MCH: 33.3 pg (ref 26.0–34.0)
MCHC: 36.2 g/dL — ABNORMAL HIGH (ref 30.0–36.0)
MCV: 92.2 fL (ref 80.0–100.0)
Monocytes Absolute: 0.5 10*3/uL (ref 0.1–1.0)
Monocytes Relative: 5 %
Neutro Abs: 6.5 10*3/uL (ref 1.7–7.7)
Neutrophils Relative %: 59 %
Platelets: 242 10*3/uL (ref 150–400)
RBC: 3.96 MIL/uL (ref 3.87–5.11)
RDW: 12.2 % (ref 11.5–15.5)
WBC: 10.7 10*3/uL — ABNORMAL HIGH (ref 4.0–10.5)
nRBC: 0 % (ref 0.0–0.2)

## 2020-02-29 LAB — POCT PREGNANCY, URINE: Preg Test, Ur: NEGATIVE

## 2020-02-29 NOTE — ED Triage Notes (Signed)
Brought over from [facility] with heavy vaginal bleeding

## 2020-03-01 ENCOUNTER — Emergency Department
Admission: EM | Admit: 2020-03-01 | Discharge: 2020-03-01 | Disposition: A | Discharge status: Home / Self Care | Payer: Self-pay | Attending: Emergency Medicine | Admitting: Emergency Medicine

## 2020-03-01 DIAGNOSIS — N938 Other specified abnormal uterine and vaginal bleeding: Secondary | ICD-10-CM

## 2020-03-01 DIAGNOSIS — R58 Hemorrhage, not elsewhere classified: Secondary | ICD-10-CM

## 2020-03-01 DIAGNOSIS — R102 Pelvic and perineal pain: Secondary | ICD-10-CM

## 2020-03-01 DIAGNOSIS — N83201 Unspecified ovarian cyst, right side: Secondary | ICD-10-CM

## 2020-03-01 DIAGNOSIS — N939 Abnormal uterine and vaginal bleeding, unspecified: Secondary | ICD-10-CM

## 2020-03-01 DIAGNOSIS — N390 Urinary tract infection, site not specified: Secondary | ICD-10-CM

## 2020-03-01 MED ORDER — HYDROCODONE-ACETAMINOPHEN 5-325 MG PO TABS
1.0000 | ORAL_TABLET | Freq: Four times a day (QID) | ORAL | 0 refills | Status: DC | PRN
Start: 2020-03-01 — End: 2020-03-09

## 2020-03-01 MED ORDER — FOSFOMYCIN TROMETHAMINE 3 G PO PACK
3.0000 g | PACK | Freq: Once | ORAL | Status: AC
  Administered 2020-03-01: 3 g via ORAL
  Filled 2020-03-01: qty 3

## 2020-03-01 MED ORDER — MEDROXYPROGESTERONE ACETATE 10 MG PO TABS: 10.0000 mg | ORAL_TABLET | Freq: Every day | ORAL | 0 refills | Status: DC

## 2020-03-01 MED ORDER — KETOROLAC TROMETHAMINE 60 MG/2ML IM SOLN
30.0000 mg | Freq: Once | INTRAMUSCULAR | Status: AC
  Administered 2020-03-01: 30 mg via INTRAMUSCULAR
  Filled 2020-03-01: qty 2

## 2020-03-01 NOTE — ED Notes (Signed)
Reviewed discharge instructions, follow-up care, and prescriptions with patient. Patient verbalized understanding of all information reviewed. Patient stable, with no distress noted at this time.    

## 2020-03-01 NOTE — ED Provider Notes (Signed)
[facility] Emergency Department Provider Note   ____________________________________________   First MD Initiated Contact with Patient 03/01/20 [phone]     (approximate)  I have reviewed the triage vital signs and the nursing notes.   HISTORY  Chief Complaint Vaginal Bleeding    HPI Barbara Arellano is a 44 y.o. female sent from [facility] for heavy vaginal bleeding.  Patient reports LMP approximately 1 month ago, ending 8/9.  Reports vaginal bleeding starting 4 days ago with clots.  She left without being seen from the ED on 8/26.  Denies OCPs.  Complains of pelvic and low back pain.  History of left ovarian torsion status post oophorectomy and bilateral salpingectomy.  Seen in the ED for similar 8/17 and prescribed dicyclomine.  Denies fever, cough, chest pain, shortness of breath, nausea or vomiting.     Past Medical History:  Diagnosis Date  . Cervical arthritis   . Fibroid, uterine     Patient Active Problem List   Diagnosis Date Noted  . Vaginal bleeding 12/05/2017  . Iron deficiency anemia due to chronic blood loss 12/05/2017    Past Surgical History:  Procedure Laterality Date  . BILATERAL SALPINGECTOMY     age 89  . BREAST BIOPSY Right 12/15/2017   pending path  . FRACTURE SURGERY    . OOPHORECTOMY Left 2015   ruptured cyst    Prior to Admission medications   Medication Sig Start Date End Date Taking? Authorizing Provider  dicyclomine (BENTYL) 10 MG capsule Take 1 capsule (10 mg total) by mouth 4 (four) times daily for 14 days. 02/19/20 03/04/20  Lavonia Drafts, MD  HYDROcodone-acetaminophen (NORCO) 5-325 MG tablet Take 1 tablet by mouth every 6 (six) hours as needed for moderate pain. 03/01/20   Paulette Blanch, MD  hydrocortisone valerate ointment (WESTCORT) 0.2 % Apply to affected area daily 08/11/19 08/10/20  Sable Feil, PA-C  medroxyPROGESTERone (PROVERA) 10 MG tablet Take 1 tablet (10 mg total) by mouth daily. 03/01/20   Paulette Blanch, MD    naproxen (NAPROSYN) 500 MG tablet Take 1 tablet (500 mg total) by mouth 2 (two) times daily with a meal. 08/11/19   Sable Feil, PA-C  predniSONE (STERAPRED UNI-PAK 21 TAB) 10 MG (21) TBPK tablet Take 6 tablets on the first day and decrease by 1 tablet each day until finished. 01/17/20   Victorino Dike, FNP    Allergies Patient has no known allergies.  Family History  Problem Relation Age of Onset  . Heart disease Mother   . Cancer Father 60       bone marrow  . Heart disease Father   . Breast cancer Maternal Aunt        late 53's    Social History Social History   Tobacco Use  . Smoking status: Current Every Day Smoker    Packs/day: 0.50    Types: Cigarettes  . Smokeless tobacco: Never Used  Vaping Use  . Vaping Use: Never used  Substance Use Topics  . Alcohol use: No  . Drug use: Yes    Types: Marijuana    Review of Systems  Constitutional: No fever/chills Eyes: No visual changes. ENT: No sore throat. Cardiovascular: Denies chest pain. Respiratory: Denies shortness of breath. Gastrointestinal: Positive for pelvic pain.  No abdominal pain.  No nausea, no vomiting.  No diarrhea.  No constipation. Genitourinary: Positive for vaginal bleeding.  Negative for dysuria. Musculoskeletal: Negative for back pain. Skin: Negative for rash.  Neurological: Negative for headaches, focal weakness or numbness.   ____________________________________________   PHYSICAL EXAM:  VITAL SIGNS: ED Triage Vitals  Enc Vitals Group     BP 02/29/20 1713 110/71     Pulse Rate 02/29/20 1713 63     Resp 02/29/20 1713 16     Temp 02/29/20 1713 98.6 F (37 C)     Temp Source 02/29/20 1713 Oral     SpO2 02/29/20 1713 98 %     Weight 02/29/20 1651 177 lb 7.5 oz (80.5 kg)     Height 02/29/20 1651 5\' 4"  (1.626 m)     Head Circumference --      Peak Flow --      Pain Score --      Pain Loc --      Pain Edu? --      Excl. in [place]? --     Constitutional: Alert and oriented. Well  appearing and in mild acute distress. Eyes: Conjunctivae are normal. PERRL. EOMI. Head: Atraumatic. Nose: No congestion/rhinnorhea. Mouth/Throat: Mucous membranes are moist.  Oropharynx non-erythematous. Neck: No stridor.   Cardiovascular: Normal rate, regular rhythm. Grossly normal heart sounds.  Good peripheral circulation. Respiratory: Normal respiratory effort.  No retractions. Lungs CTAB. Gastrointestinal: Soft and mildly tender pelvis without rebound or guarding. No distention. No abdominal bruits. No CVA tenderness. Musculoskeletal: No lower extremity tenderness nor edema.  No joint effusions. Neurologic:  Normal speech and language. No gross focal neurologic deficits are appreciated. No gait instability. Skin:  Skin is warm, dry and intact. No rash noted. Psychiatric: Mood and affect are normal. Speech and behavior are normal.  ____________________________________________   LABS (all labs ordered are listed, but only abnormal results are displayed)  Labs Reviewed  URINALYSIS, ROUTINE W REFLEX MICROSCOPIC - Abnormal; Notable for the following components:      Result Value   Color, Urine AMBER (*)    APPearance CLOUDY (*)    Hgb urine dipstick LARGE (*)    Protein, ur 100 (*)    Leukocytes,Ua TRACE (*)    RBC / HPF >50 (*)    All other components within normal limits  CBC WITH DIFFERENTIAL/PLATELET - Abnormal; Notable for the following components:   WBC 10.7 (*)    MCHC 36.2 (*)    Eosinophils Absolute 0.6 (*)    All other components within normal limits  POCT PREGNANCY, URINE  POC URINE PREG, ED   ____________________________________________  EKG  None ____________________________________________  RADIOLOGY  ED MD interpretation: Endometrial stripe 3 mm in thickness, 4.8 cm simple right ovarian cyst without torsion  Official radiology report(s): US PELVIC COMPLETE W TRANSVAGINAL AND TORSION R/O  Result Date: 03/01/2020 CLINICAL DATA:  Initial evaluation for  heavy vaginal bleeding. History of prior left oophorectomy. EXAM: TRANSABDOMINAL AND TRANSVAGINAL ULTRASOUND OF PELVIS DOPPLER ULTRASOUND OF OVARIES TECHNIQUE: Both transabdominal and transvaginal ultrasound examinations of the pelvis were performed. Transabdominal technique was performed for global imaging of the pelvis including uterus, ovaries, adnexal regions, and pelvic cul-de-sac. It was necessary to proceed with endovaginal exam following the transabdominal exam to visualize the uterus, endometrium, and ovaries. Color and duplex Doppler ultrasound was utilized to evaluate blood flow to the ovaries. COMPARISON:  None available. FINDINGS: Uterus Measurements: 9.3 x 4.2 x 5.3 cm = volume: 17.3 mL. No fibroids or other mass visualized. Endometrium Thickness: 3 mm.  No focal abnormality visualized. Right ovary Measurements: 5.5 x 4.3 x 4.4 cm = volume: 53.5 mL. 4.8 x 3.2 x 4.0 cm  simple cyst. No internal complexity, vascularity, or solid nodularity. Left ovary Surgically absent.  No adnexal mass. Pulsed Doppler evaluation of the right ovary demonstrates normal low-resistance arterial and venous waveforms. Other findings No abnormal free fluid. IMPRESSION: 1. Endometrial stripe measures 3 mm in thickness. If bleeding remains unresponsive to hormonal or medical therapy, sonohysterogram should be considered for focal lesion work-up. (Ref: Radiological Reasoning: Algorithmic Workup of Abnormal Vaginal Bleeding with Endovaginal Sonography and Sonohysterography. AJR 2008; 235:T61-44). 2. 4.8 cm simple right ovarian cyst. This has benign characteristics and is a common finding in premenopausal females. No imaging follow up is required. This follows consensus guidelines: Simple Adnexal Cysts: SRU Consensus Conference Update on Follow-up and Reporting. Radiology 2019; [id number]. 3. No evidence for right ovarian torsion. 4. Prior left oophorectomy. 5. Otherwise unremarkable pelvic ultrasound. No other acute abnormality  identified. Electronically Signed   By: Jeannine Boga M.D.   On: 03/01/2020 00:42    ____________________________________________   PROCEDURES  Procedure(s) performed (including Critical Care):  Procedures  Pelvic exam: External exam within normal limits without rashes, lesions or vesicles.  Speculum exam reveals mild bleeding.  Cervical os is closed.  Right adnexal tenderness on bimanual exam. ____________________________________________   INITIAL IMPRESSION / ASSESSMENT AND PLAN / ED COURSE  As part of my medical decision making, I reviewed the following data within the [place] notes reviewed and incorporated, Labs reviewed, Old chart reviewed, Radiograph reviewed, Notes from prior ED visits and [place] Controlled Substance Database     SIENA POEHLER was evaluated in Emergency Department on 03/01/2020 for the symptoms described in the history of present illness. She was evaluated in the context of the global COVID-19 pandemic, which necessitated consideration that the patient might be at risk for infection with the SARS-CoV-2 virus that causes COVID-19. Institutional protocols and algorithms that pertain to the evaluation of patients at risk for COVID-19 are in a state of rapid change based on information released by regulatory bodies including the CDC and federal and state organizations. These policies and algorithms were followed during the patient's care in the ED.    44 year old female sent to the ED for heavy vaginal bleeding. Differential diagnosis includes, but is not limited to, ovarian cyst, ovarian torsion, acute appendicitis, diverticulitis, urinary tract infection/pyelonephritis, endometriosis, bowel obstruction, colitis, renal colic, gastroenteritis, hernia, fibroids, endometriosis, pregnancy related pain including ectopic pregnancy, etc.  I personally reviewed patient's lab work from 8/26 as well as lab work and ultrasound tonight.  Discuss  risks of blood clot with starting Provera versus benefits; patient acknowledges risks of blood clot and wishes to start Provera.  IM Toradol now and prescription for Norco to go home.  Will dose fosfomycin prior to discharge for mild UTI.  Will follow up with GYN as an outpatient.  Strict return precautions given.  Patient verbalizes understanding agrees with plan of care.      ____________________________________________   FINAL CLINICAL IMPRESSION(S) / ED DIAGNOSES  Final diagnoses:  Bleeding  Pelvic pain  Dysfunctional uterine bleeding  Cyst of right ovary  Urinary tract infection without hematuria, site unspecified     ED Discharge Orders         Ordered    HYDROcodone-acetaminophen (NORCO) 5-325 MG tablet  Every 6 hours PRN        03/01/20 0306    medroxyPROGESTERone (PROVERA) 10 MG tablet  Daily        03/01/20 0306  Note:  This document was prepared using Dragon voice recognition software and may include unintentional dictation errors.   Paulette Blanch, MD 03/01/20 [phone]

## 2020-03-01 NOTE — Discharge Instructions (Signed)
1.  You may take Ibuprofen as needed for pain; Norco as needed for more severe pain. 2.  Take Provera 10 mg daily x10 days. 3.  Return to the ER for worsening symptoms, persistent vomiting, difficulty breathing or other concerns.

## 2020-03-09 ENCOUNTER — Emergency Department: Payer: Self-pay

## 2020-03-09 ENCOUNTER — Emergency Department
Admission: EM | Admit: 2020-03-09 | Discharge: 2020-03-09 | Disposition: A | Discharge status: Home / Self Care | Payer: Self-pay | Attending: Student in an Organized Health Care Education/Training Program | Admitting: Student in an Organized Health Care Education/Training Program

## 2020-03-09 ENCOUNTER — Other Ambulatory Visit: Payer: Self-pay

## 2020-03-09 DIAGNOSIS — R1031 Right lower quadrant pain: Secondary | ICD-10-CM | POA: Insufficient documentation

## 2020-03-09 DIAGNOSIS — F17210 Nicotine dependence, cigarettes, uncomplicated: Secondary | ICD-10-CM | POA: Insufficient documentation

## 2020-03-09 DIAGNOSIS — R102 Pelvic and perineal pain: Secondary | ICD-10-CM

## 2020-03-09 DIAGNOSIS — N83201 Unspecified ovarian cyst, right side: Secondary | ICD-10-CM | POA: Insufficient documentation

## 2020-03-09 LAB — COMPREHENSIVE METABOLIC PANEL
ALT: 25 U/L (ref 0–44)
AST: 32 U/L (ref 15–41)
Albumin: 4.5 g/dL (ref 3.5–5.0)
Alkaline Phosphatase: 128 U/L — ABNORMAL HIGH (ref 38–126)
Anion gap: 12 (ref 5–15)
BUN: 15 mg/dL (ref 6–20)
CO2: 24 mmol/L (ref 22–32)
Calcium: 9.4 mg/dL (ref 8.9–10.3)
Chloride: 100 mmol/L (ref 98–111)
Creatinine, Ser: 0.5 mg/dL (ref 0.44–1.00)
GFR calc Af Amer: >60 mL/min (ref >60)
GFR calc non Af Amer: >60 mL/min (ref >60)
Glucose, Bld: 95 mg/dL (ref 70–99)
Potassium: 3.6 mmol/L (ref 3.5–5.1)
Sodium: 136 mmol/L (ref 135–145)
Total Bilirubin: 0.6 mg/dL (ref 0.3–1.2)
Total Protein: 8.3 g/dL — ABNORMAL HIGH (ref 6.5–8.1)

## 2020-03-09 LAB — URINALYSIS, ROUTINE W REFLEX MICROSCOPIC
Bilirubin Urine: NEGATIVE
Glucose, UA: NEGATIVE mg/dL
Hgb urine dipstick: NEGATIVE
Ketones, ur: NEGATIVE mg/dL
Nitrite: NEGATIVE
Protein, ur: 30 mg/dL — AB
Specific Gravity, Urine: 1.026 (ref 1.005–1.030)
pH: 6 (ref 5.0–8.0)

## 2020-03-09 LAB — CBC WITH DIFFERENTIAL/PLATELET
Abs Immature Granulocytes: 0.03 10*3/uL (ref 0.00–0.07)
Basophils Absolute: 0.1 10*3/uL (ref 0.0–0.1)
Basophils Relative: 1 %
Eosinophils Absolute: 0.4 10*3/uL (ref 0.0–0.5)
Eosinophils Relative: 4 %
HCT: 38.9 % (ref 36.0–46.0)
Hemoglobin: 13.8 g/dL (ref 12.0–15.0)
Immature Granulocytes: 0 %
Lymphocytes Relative: 30 %
Lymphs Abs: 3.3 10*3/uL (ref 0.7–4.0)
MCH: 33.1 pg (ref 26.0–34.0)
MCHC: 35.5 g/dL (ref 30.0–36.0)
MCV: 93.3 fL (ref 80.0–100.0)
Monocytes Absolute: 0.6 10*3/uL (ref 0.1–1.0)
Monocytes Relative: 5 %
Neutro Abs: 6.8 10*3/uL (ref 1.7–7.7)
Neutrophils Relative %: 60 %
Platelets: 288 10*3/uL (ref 150–400)
RBC: 4.17 MIL/uL (ref 3.87–5.11)
RDW: 12 % (ref 11.5–15.5)
WBC: 11.2 10*3/uL — ABNORMAL HIGH (ref 4.0–10.5)
nRBC: 0 % (ref 0.0–0.2)

## 2020-03-09 LAB — POCT PREGNANCY, URINE: Preg Test, Ur: NEGATIVE

## 2020-03-09 MED ORDER — MORPHINE SULFATE (PF) 4 MG/ML IV SOLN
4.0000 mg | INTRAVENOUS | Status: DC | PRN
  Administered 2020-03-09: 4 mg via INTRAVENOUS
  Filled 2020-03-09: qty 1

## 2020-03-09 MED ORDER — IOHEXOL 300 MG/ML  SOLN
100.0000 mL | Freq: Once | INTRAMUSCULAR | Status: AC | PRN
  Administered 2020-03-09: 100 mL via INTRAVENOUS

## 2020-03-09 MED ORDER — ONDANSETRON HCL 4 MG/2ML IJ SOLN
4.0000 mg | Freq: Once | INTRAMUSCULAR | Status: AC
  Administered 2020-03-09: 4 mg via INTRAVENOUS
  Filled 2020-03-09: qty 2

## 2020-03-09 MED ORDER — FENTANYL CITRATE (PF) 100 MCG/2ML IJ SOLN
50.0000 ug | INTRAMUSCULAR | Status: DC | PRN
  Administered 2020-03-09: 50 ug via INTRAVENOUS
  Filled 2020-03-09: qty 2

## 2020-03-09 MED ORDER — HYDROCODONE-ACETAMINOPHEN 5-325 MG PO TABS
1.0000 | ORAL_TABLET | Freq: Four times a day (QID) | ORAL | 0 refills | Status: DC | PRN
Start: 2020-03-09 — End: 2020-03-24

## 2020-03-09 NOTE — ED Notes (Signed)
Pt presents to the ED for R sided abdominal pain. Pt states that she has had a hx of an ruptured ovarian cyst on the L side 6 years ago. Pt state that she was dx with a R sided ovarian cyst a couple of weeks ago but her pain intensified today. Also, c/o nausea without vomiting since this morning. Pt is A&Ox4 and NAD. Pt states that her pain is 10/10. VSS

## 2020-03-09 NOTE — ED Notes (Signed)
Pt transported to CT at this time.

## 2020-03-09 NOTE — Discharge Instructions (Signed)

## 2020-03-09 NOTE — ED Triage Notes (Signed)
Patient arrived via POV from home. Patient is AOx4 and ambulatory although not currently due to pain. Patient chief complaint is pain on right side and states it feels like her ovarian cyst has ruptured. Patient states she had left ovarian cyst rupture 6years ago. Patient is in intense pain at this time. Pain began 2 weeks ago and was seen last week. Pain has intensified.

## 2020-03-09 NOTE — ED Provider Notes (Signed)
[facility] Emergency Department Provider Note    First MD Initiated Contact with Patient 03/09/20 2004     (approximate)  I have reviewed the triage vital signs and the nursing notes.   HISTORY  Chief Complaint Ovarian Cyst Rupture    HPI Barbara Arellano is a 44 y.o. female with the below listed past medical history presents to the ER for 3 weeks of progressively worsening right lower quadrant abdominal pain.  Is gotten to the point where she can no longer make it through work.  States is crampy in nature.  Has not had any dysuria.  No vaginal discharge or bleeding.  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  Still has her appendix.  Is having some nausea and vomiting associated with the pain.    Past Medical History:  Diagnosis Date  . Cervical arthritis   . Fibroid, uterine    Family History  Problem Relation Age of Onset  . Heart disease Mother   . Cancer Father 86       bone marrow  . Heart disease Father   . Breast cancer Maternal Aunt        late 30's   Past Surgical History:  Procedure Laterality Date  . BILATERAL SALPINGECTOMY     age 48  . BREAST BIOPSY Right 12/15/2017   pending path  . FRACTURE SURGERY    . OOPHORECTOMY Left 2015   ruptured cyst   Patient Active Problem List   Diagnosis Date Noted  . Vaginal bleeding 12/05/2017  . Iron deficiency anemia due to chronic blood loss 12/05/2017      Prior to Admission medications   Medication Sig Start Date End Date Taking? Authorizing Provider  dicyclomine (BENTYL) 10 MG capsule Take 1 capsule (10 mg total) by mouth 4 (four) times daily for 14 days. 02/19/20 03/04/20  Lavonia Drafts, MD  HYDROcodone-acetaminophen (NORCO) 5-325 MG tablet Take 1 tablet by mouth every 6 (six) hours as needed for moderate pain. 03/01/20   Paulette Blanch, MD  medroxyPROGESTERone (PROVERA) 10 MG tablet Take 1 tablet (10 mg total) by mouth daily. 03/01/20   Paulette Blanch, MD     Allergies Patient has no known allergies.    Social History Social History   Tobacco Use  . Smoking status: Current Every Day Smoker    Packs/day: 0.50    Types: Cigarettes  . Smokeless tobacco: Never Used  Vaping Use  . Vaping Use: Never used  Substance Use Topics  . Alcohol use: No  . Drug use: Yes    Types: Marijuana    Review of Systems Patient denies headaches, rhinorrhea, blurry vision, numbness, shortness of breath, chest pain, edema, cough, abdominal pain, nausea, vomiting, diarrhea, dysuria, fevers, rashes or hallucinations unless otherwise stated above in HPI. ____________________________________________   PHYSICAL EXAM:  VITAL SIGNS: Vitals:   03/09/20 1408 03/09/20 2011  BP:  110/77  Pulse:  73  Resp:  16  Temp: 98.3 F (36.8 C)   SpO2:  100%    Constitutional: Alert and oriented.  Eyes: Conjunctivae are normal.  Head: Atraumatic. Nose: No congestion/rhinnorhea. Mouth/Throat: Mucous membranes are moist.   Neck: No stridor. Painless ROM.  Cardiovascular: Normal rate, regular rhythm. Grossly normal heart sounds.  Good peripheral circulation. Respiratory: Normal respiratory effort.  No retractions. Lungs CTAB. Gastrointestinal: Soft with mild ttp in rlq, no guarding or rebound. No distention. No abdominal bruits. No CVA tenderness. Genitourinary:  Musculoskeletal: No lower  extremity tenderness nor edema.  No joint effusions. Neurologic:  Normal speech and language. No gross focal neurologic deficits are appreciated. No facial droop Skin:  Skin is warm, dry and intact. No rash noted. Psychiatric: Mood and affect are normal. Speech and behavior are normal.  ____________________________________________   LABS (all labs ordered are listed, but only abnormal results are displayed)  Results for orders placed or performed during the hospital encounter of 03/09/20 (from the past 24 hour(s))  Urinalysis, Routine w reflex microscopic Urine, Clean Catch      Status: Abnormal   Collection Time: 03/09/20  2:17 PM  Result Value Ref Range   Color, Urine YELLOW (A) YELLOW   APPearance HAZY (A) CLEAR   Specific Gravity, Urine 1.026 1.005 - 1.030   pH 6.0 5.0 - 8.0   Glucose, UA NEGATIVE NEGATIVE mg/dL   Hgb urine dipstick NEGATIVE NEGATIVE   Bilirubin Urine NEGATIVE NEGATIVE   Ketones, ur NEGATIVE NEGATIVE mg/dL   Protein, ur 30 (A) NEGATIVE mg/dL   Nitrite NEGATIVE NEGATIVE   Leukocytes,Ua TRACE (A) NEGATIVE   RBC / HPF 0-5 0 - 5 RBC/hpf   WBC, UA 6-10 0 - 5 WBC/hpf   Bacteria, UA RARE (A) NONE SEEN   Squamous Epithelial / LPF 6-10 0 - 5   Mucus PRESENT    Hyaline Casts, UA PRESENT   Comprehensive metabolic panel     Status: Abnormal   Collection Time: 03/09/20  2:17 PM  Result Value Ref Range   Sodium 136 135 - 145 mmol/L   Potassium 3.6 3.5 - 5.1 mmol/L   Chloride 100 98 - 111 mmol/L   CO2 24 22 - 32 mmol/L   Glucose, Bld 95 70 - 99 mg/dL   BUN 15 6 - 20 mg/dL   Creatinine, Ser 0.50 0.44 - 1.00 mg/dL   Calcium 9.4 8.9 - 10.3 mg/dL   Total Protein 8.3 (H) 6.5 - 8.1 g/dL   Albumin 4.5 3.5 - 5.0 g/dL   AST 32 15 - 41 U/L   ALT 25 0 - 44 U/L   Alkaline Phosphatase 128 (H) 38 - 126 U/L   Total Bilirubin 0.6 0.3 - 1.2 mg/dL   GFR calc non Af Amer >60 >60 mL/min   GFR calc Af Amer >60 >60 mL/min   Anion gap 12 5 - 15  CBC with Differential     Status: Abnormal   Collection Time: 03/09/20  2:17 PM  Result Value Ref Range   WBC 11.2 (H) 4.0 - 10.5 K/uL   RBC 4.17 3.87 - 5.11 MIL/uL   Hemoglobin 13.8 12.0 - 15.0 g/dL   HCT 38.9 36 - 46 %   MCV 93.3 80.0 - 100.0 fL   MCH 33.1 26.0 - 34.0 pg   MCHC 35.5 30.0 - 36.0 g/dL   RDW 12.0 11.5 - 15.5 %   Platelets 288 150 - 400 K/uL   nRBC 0.0 0.0 - 0.2 %   Neutrophils Relative % 60 %   Neutro Abs 6.8 1.7 - 7.7 K/uL   Lymphocytes Relative 30 %   Lymphs Abs 3.3 0.7 - 4.0 K/uL   Monocytes Relative 5 %   Monocytes Absolute 0.6 0 - 1 K/uL   Eosinophils Relative 4 %   Eosinophils  Absolute 0.4 0 - 0 K/uL   Basophils Relative 1 %   Basophils Absolute 0.1 0 - 0 K/uL   Immature Granulocytes 0 %   Abs Immature Granulocytes 0.03 0.00 - 0.07 K/uL  Pregnancy, urine POC     Status: None   Collection Time: 03/09/20  5:21 PM  Result Value Ref Range   Preg Test, Ur NEGATIVE NEGATIVE   ____________________________________________ ____________________________________________  RADIOLOGY  I personally reviewed all radiographic images ordered to evaluate for the above acute complaints and reviewed radiology reports and findings.  These findings were personally discussed with the patient.  Please see medical record for radiology report.  ____________________________________________   PROCEDURES  Procedure(s) performed:  Procedures    Critical Care performed: no ____________________________________________   INITIAL IMPRESSION / ASSESSMENT AND PLAN / ED COURSE  Pertinent labs & imaging results that were available during my care of the patient were reviewed by me and considered in my medical decision making (see chart for details).   DDX: Ovarian cyst, torsion, hernia, appendicitis, colitis, musculoskeletal strain, cystitis, PID, TOA, ectopic  MICAELLA GITTO is a 44 y.o. who presents to the ED with presentation as described above.  Transvaginal ultrasound ordered at triage shows persistent cyst but there is no Doppler study performed therefore will order Doppler study to evaluate for evidence of torsion.  Given the location of pain will also order CT imaging she does have mild leukocytosis.  She is nontoxic-appearing she is afebrile well-perfused does not have any guarding or rebound.  Provide IV fluids as well as IV narcotic medication and IV antiemetic and reassess.  Clinical Course as of Mar 09 2201  Nancy Fetter Mar 09, 2020  2158 Patient reassessed.  No signs of torsion.  She is been having this pain for several weeks.  On further questioning she is not currently sexually  active no history of STI denying any vaginal bleeding or discharge at this time.  Without consistent with abscess or PID based on history.  Do not see any signs of UTI or Pyelo.  Given reassuring work-up I do believe she is appropriate for outpatient follow-up.   [PR]    Clinical Course User Index [PR] Merlyn Lot, MD    The patient was evaluated in Emergency Department today for the symptoms described in the history of present illness. He/she was evaluated in the context of the global COVID-19 pandemic, which necessitated consideration that the patient might be at risk for infection with the SARS-CoV-2 virus that causes COVID-19. Institutional protocols and algorithms that pertain to the evaluation of patients at risk for COVID-19 are in a state of rapid change based on information released by regulatory bodies including the CDC and federal and state organizations. These policies and algorithms were followed during the patient's care in the ED.  As part of my medical decision making, I reviewed the following data within the [place] notes reviewed and incorporated, Labs reviewed, notes from prior ED visits and [place] Controlled Substance Database   ____________________________________________   FINAL CLINICAL IMPRESSION(S) / ED DIAGNOSES  Final diagnoses:  RLQ abdominal pain  Cyst of right ovary      NEW MEDICATIONS STARTED DURING THIS VISIT:  New Prescriptions   No medications on file     Note:  This document was prepared using Dragon voice recognition software and may include unintentional dictation errors.    Merlyn Lot, MD 03/09/20 2203

## 2020-03-20 ENCOUNTER — Ambulatory Visit (INDEPENDENT_AMBULATORY_CARE_PROVIDER_SITE_OTHER): Payer: Self-pay | Admitting: Obstetrics & Gynecology

## 2020-03-20 ENCOUNTER — Other Ambulatory Visit: Payer: Self-pay

## 2020-03-20 ENCOUNTER — Encounter: Payer: Self-pay | Admitting: Obstetrics & Gynecology

## 2020-03-20 ENCOUNTER — Telehealth: Payer: Self-pay | Admitting: Obstetrics & Gynecology

## 2020-03-20 VITALS — BP 120/80 | Ht 64.0 in | Wt 177.0 lb

## 2020-03-20 DIAGNOSIS — R1031 Right lower quadrant pain: Secondary | ICD-10-CM

## 2020-03-20 DIAGNOSIS — N83201 Unspecified ovarian cyst, right side: Secondary | ICD-10-CM | POA: Insufficient documentation

## 2020-03-20 NOTE — H&P (View-Only) (Signed)
HISTORY AND PHYSICAL EXAM  HPI:  Barbara Arellano is a 44 y.o. G1P1001  (Menstrual status: Irregular Periods).; she is being admitted for surgery related to pelvic pain and a right 4-5cm ovarian cyst.  She has had 4 weeks of intermittent sharp shooting pain, but over the last 4 days it has intensified, right sided, severe, radiates to groin, associated w nausea and vag bleeding, and has caused her to miss work.   She has had 3 periods this year, and has no real vasomotor sx's.  Prior left oophorectomy for painful cyst many years ago. Also s/o bilateral salpingectomy age 71 for hydrosalpinx.  No desire for fertility.  PMHx: Past Medical History:  Diagnosis Date  . Cervical arthritis   . Fibroid, uterine    Past Surgical History:  Procedure Laterality Date  . BILATERAL SALPINGECTOMY     age 39  . BREAST BIOPSY Right 12/15/2017   pending path  . FRACTURE SURGERY    . OOPHORECTOMY Left 2015   ruptured cyst   Family History  Problem Relation Age of Onset  . Heart disease Mother   . Cancer Father 89       bone marrow  . Heart disease Father   . Breast cancer Maternal Aunt        late 56's   Social History   Tobacco Use  . Smoking status: Current Every Day Smoker    Packs/day: 0.50    Types: Cigarettes  . Smokeless tobacco: Never Used  Vaping Use  . Vaping Use: Never used  Substance Use Topics  . Alcohol use: No  . Drug use: Yes    Types: Marijuana    Current Outpatient Medications:  .  dicyclomine (BENTYL) 10 MG capsule, Take 1 capsule (10 mg total) by mouth 4 (four) times daily for 14 days., Disp: 56 capsule, Rfl: 0 .  HYDROcodone-acetaminophen (NORCO) 5-325 MG tablet, Take 1 tablet by mouth every 6 (six) hours as needed for moderate pain. (Patient not taking: Reported on 03/20/2020), Disp: 6 tablet, Rfl: 0 .  medroxyPROGESTERone (PROVERA) 10 MG tablet, Take 1 tablet (10 mg total) by mouth daily. (Patient not taking: Reported on 03/20/2020), Disp: 10 tablet, Rfl:  0 Allergies: Patient has no known allergies.  Review of Systems  Constitutional: Positive for malaise/fatigue. Negative for chills and fever.  HENT: Negative for congestion, sinus pain and sore throat.   Eyes: Negative for blurred vision and pain.  Respiratory: Negative for cough and wheezing.   Cardiovascular: Negative for chest pain and leg swelling.  Gastrointestinal: Positive for abdominal pain, nausea and vomiting. Negative for constipation, diarrhea and heartburn.  Genitourinary: Negative for dysuria, frequency, hematuria and urgency.  Musculoskeletal: Negative for back pain, joint pain, myalgias and neck pain.  Skin: Negative for itching and rash.  Neurological: Positive for dizziness. Negative for tremors and weakness.  Endo/Heme/Allergies: Bruises/bleeds easily.  Psychiatric/Behavioral: Positive for depression. The patient is nervous/anxious. The patient does not have insomnia.     Objective: BP 120/80   Ht 5\' 4"  (1.626 m)   Wt 177 lb (80.3 kg)   BMI 30.38 kg/m   Filed Weights   03/20/20 1431  Weight: 177 lb (80.3 kg)   Physical Exam Constitutional:      General: She is not in acute distress.    Appearance: She is well-developed.  HENT:     Head: Normocephalic and atraumatic. No laceration.     Right Ear: Hearing normal.     Left Ear: Hearing  normal.     Mouth/Throat:     Pharynx: Uvula midline.  Eyes:     Pupils: Pupils are equal, round, and reactive to light.  Neck:     Thyroid: No thyromegaly.  Cardiovascular:     Rate and Rhythm: Normal rate and regular rhythm.     Heart sounds: No murmur heard.  No friction rub. No gallop.   Pulmonary:     Effort: Pulmonary effort is normal. No respiratory distress.     Breath sounds: Normal breath sounds. No wheezing.  Chest:     Breasts:        Right: No mass, skin change or tenderness.        Left: No mass, skin change or tenderness.  Abdominal:     General: Bowel sounds are normal. There is no distension.      Palpations: Abdomen is soft.     Tenderness: There is no abdominal tenderness. There is no rebound.  Musculoskeletal:        General: Normal range of motion.     Cervical back: Normal range of motion and neck supple.  Neurological:     Mental Status: She is alert and oriented to person, place, and time.     Cranial Nerves: No cranial nerve deficit.  Skin:    General: Skin is warm and dry.  Psychiatric:        Judgment: Judgment normal.  Vitals reviewed.     Assessment: 1. Right ovarian cyst   2. Right lower quadrant pain   Plan laparoscopy and right ovarian cystectomy.  Prefer to preserve ovary if possible.  Pt prefers ovary to be removed if necessary, is understanding of menopausal consequences to its removal.  I have had a careful discussion with this patient about all the options available and the risk/benefits of each. I have fully informed this patient that surgery may subject her to a variety of discomforts and risks: She understands that most patients have surgery with little difficulty, but problems can happen ranging from minor to fatal. These include nausea, vomiting, pain, bleeding, infection, poor healing, hernia, or formation of adhesions. Unexpected reactions may occur from any drug or anesthetic given. Unintended injury may occur to other pelvic or abdominal structures such as Fallopian tubes, ovaries, bladder, ureter (tube from kidney to bladder), or bowel. Nerves going from the pelvis to the legs may be injured. Any such injury may require immediate or later additional surgery to correct the problem. Excessive blood loss requiring transfusion is very unlikely but possible. Dangerous blood clots may form in the legs or lungs. Physical and sexual activity will be restricted in varying degrees for an indeterminate period of time but most often 2-6 weeks.  Finally, she understands that it is impossible to list every possible undesirable effect and that the condition for which  surgery is done is not always cured or significantly improved, and in rare cases may be even worse.Ample time was given to answer all questions.  Barnett Applebaum, MD, [facility], [facility] 03/20/2020  3:03 PM

## 2020-03-20 NOTE — Progress Notes (Signed)
HISTORY AND PHYSICAL EXAM  HPI:  Barbara Arellano is a 44 y.o. G1P1001  (Menstrual status: Irregular Periods).; she is being admitted for surgery related to pelvic pain and a right 4-5cm ovarian cyst.  She has had 4 weeks of intermittent sharp shooting pain, but over the last 4 days it has intensified, right sided, severe, radiates to groin, associated w nausea and vag bleeding, and has caused her to miss work.   She has had 3 periods this year, and has no real vasomotor sx's.  Prior left oophorectomy for painful cyst many years ago. Also s/o bilateral salpingectomy age 44 for hydrosalpinx.  No desire for fertility.  PMHx: Past Medical History:  Diagnosis Date  . Cervical arthritis   . Fibroid, uterine    Past Surgical History:  Procedure Laterality Date  . BILATERAL SALPINGECTOMY     age 40  . BREAST BIOPSY Right 12/15/2017   pending path  . FRACTURE SURGERY    . OOPHORECTOMY Left 2015   ruptured cyst   Family History  Problem Relation Age of Onset  . Heart disease Mother   . Cancer Father 85       bone marrow  . Heart disease Father   . Breast cancer Maternal Aunt        late 50's   Social History   Tobacco Use  . Smoking status: Current Every Day Smoker    Packs/day: 0.50    Types: Cigarettes  . Smokeless tobacco: Never Used  Vaping Use  . Vaping Use: Never used  Substance Use Topics  . Alcohol use: No  . Drug use: Yes    Types: Marijuana    Current Outpatient Medications:  .  dicyclomine (BENTYL) 10 MG capsule, Take 1 capsule (10 mg total) by mouth 4 (four) times daily for 14 days., Disp: 56 capsule, Rfl: 0 .  HYDROcodone-acetaminophen (NORCO) 5-325 MG tablet, Take 1 tablet by mouth every 6 (six) hours as needed for moderate pain. (Patient not taking: Reported on 03/20/2020), Disp: 6 tablet, Rfl: 0 .  medroxyPROGESTERone (PROVERA) 10 MG tablet, Take 1 tablet (10 mg total) by mouth daily. (Patient not taking: Reported on 03/20/2020), Disp: 10 tablet, Rfl:  0 Allergies: Patient has no known allergies.  Review of Systems  Constitutional: Positive for malaise/fatigue. Negative for chills and fever.  HENT: Negative for congestion, sinus pain and sore throat.   Eyes: Negative for blurred vision and pain.  Respiratory: Negative for cough and wheezing.   Cardiovascular: Negative for chest pain and leg swelling.  Gastrointestinal: Positive for abdominal pain, nausea and vomiting. Negative for constipation, diarrhea and heartburn.  Genitourinary: Negative for dysuria, frequency, hematuria and urgency.  Musculoskeletal: Negative for back pain, joint pain, myalgias and neck pain.  Skin: Negative for itching and rash.  Neurological: Positive for dizziness. Negative for tremors and weakness.  Endo/Heme/Allergies: Bruises/bleeds easily.  Psychiatric/Behavioral: Positive for depression. The patient is nervous/anxious. The patient does not have insomnia.     Objective: BP 120/80   Ht 5\' 4"  (1.626 m)   Wt 177 lb (80.3 kg)   BMI 30.38 kg/m   Filed Weights   03/20/20 1431  Weight: 177 lb (80.3 kg)   Physical Exam Constitutional:      General: She is not in acute distress.    Appearance: She is well-developed.  HENT:     Head: Normocephalic and atraumatic. No laceration.     Right Ear: Hearing normal.     Left Ear: Hearing  normal.     Mouth/Throat:     Pharynx: Uvula midline.  Eyes:     Pupils: Pupils are equal, round, and reactive to light.  Neck:     Thyroid: No thyromegaly.  Cardiovascular:     Rate and Rhythm: Normal rate and regular rhythm.     Heart sounds: No murmur heard.  No friction rub. No gallop.   Pulmonary:     Effort: Pulmonary effort is normal. No respiratory distress.     Breath sounds: Normal breath sounds. No wheezing.  Chest:     Breasts:        Right: No mass, skin change or tenderness.        Left: No mass, skin change or tenderness.  Abdominal:     General: Bowel sounds are normal. There is no distension.      Palpations: Abdomen is soft.     Tenderness: There is no abdominal tenderness. There is no rebound.  Musculoskeletal:        General: Normal range of motion.     Cervical back: Normal range of motion and neck supple.  Neurological:     Mental Status: She is alert and oriented to person, place, and time.     Cranial Nerves: No cranial nerve deficit.  Skin:    General: Skin is warm and dry.  Psychiatric:        Judgment: Judgment normal.  Vitals reviewed.     Assessment: 1. Right ovarian cyst   2. Right lower quadrant pain   Plan laparoscopy and right ovarian cystectomy.  Prefer to preserve ovary if possible.  Pt prefers ovary to be removed if necessary, is understanding of menopausal consequences to its removal.  I have had a careful discussion with this patient about all the options available and the risk/benefits of each. I have fully informed this patient that surgery may subject her to a variety of discomforts and risks: She understands that most patients have surgery with little difficulty, but problems can happen ranging from minor to fatal. These include nausea, vomiting, pain, bleeding, infection, poor healing, hernia, or formation of adhesions. Unexpected reactions may occur from any drug or anesthetic given. Unintended injury may occur to other pelvic or abdominal structures such as Fallopian tubes, ovaries, bladder, ureter (tube from kidney to bladder), or bowel. Nerves going from the pelvis to the legs may be injured. Any such injury may require immediate or later additional surgery to correct the problem. Excessive blood loss requiring transfusion is very unlikely but possible. Dangerous blood clots may form in the legs or lungs. Physical and sexual activity will be restricted in varying degrees for an indeterminate period of time but most often 2-6 weeks.  Finally, she understands that it is impossible to list every possible undesirable effect and that the condition for which  surgery is done is not always cured or significantly improved, and in rare cases may be even worse.Ample time was given to answer all questions.  Barnett Applebaum, MD, [facility], [facility] 03/20/2020  3:03 PM

## 2020-03-20 NOTE — Patient Instructions (Addendum)
PRE ADMISSION TESTING For Covid, prior to procedure Friday 9:00-10:00 [facility] entrance (drive up)  Results in 48-72 hours You will not receive notification if test results are negative. If positive for Covid19, your provider will notify you by phone, with additional instructions.   Ovarian Cystectomy, Care After This sheet gives you information about how to care for yourself after your procedure. Your health care provider may also give you more specific instructions. If you have problems or questions, contact your health care provider. What can I expect after the procedure? After the procedure, it is common to have:  Pain in the abdomen, especially at the incision areas. You will be given pain medicines to control the pain.  Tiredness. This is a normal part of the recovery process. Your energy level will return to normal over the next several weeks. Follow these instructions at home: Medicines  Take over-the-counter and prescription medicines only as told by your health care provider.  If you were prescribed an antibiotic medicine, use it as told by your health care provider. Do not stop using the antibiotic even if you start to feel better.  Do not take aspirin because it can cause bleeding.  Ask your health care provider if the medicine prescribed to you: ? Requires you to avoid driving or using heavy machinery. ? Can cause constipation. You may need to take these actions to prevent or treat constipation:  Drink enough fluid to keep your urine pale yellow.  Take over-the-counter or prescription medicines.  Eat foods that are high in fiber, such as beans, whole grains, and fresh fruits and vegetables.  Limit foods that are high in fat and processed sugars, such as fried or sweet foods. Incision care   Follow instructions from your health care provider about how to take care of your incisions. Make sure you: ? Wash your hands with soap and water before and  after you change your bandage (dressing). If soap and water are not available, use hand sanitizer. ? Change your dressing as told by your health care provider. ? Leave stitches (sutures), skin glue, or adhesive strips in place. These skin closures may need to stay in place for 2 weeks or longer. If adhesive strip edges start to loosen and curl up, you may trim the loose edges. Do not remove adhesive strips completely unless your health care provider tells you to do that.  Check your incision areas every day for signs of infection. Check for: ? Redness, swelling, or pain. ? Fluid or blood. ? Warmth. ? Pus or a bad smell.  Do not take baths, swim, or use a hot tub until your health care provider approves. Ask your health care provider if you may take showers. You may only be allowed to take sponge baths. Activity  Rest as told by your health care provider.  Avoid sitting for a long time without moving. Get up to take short walks every 1-2 hours. This is important to improve blood flow and breathing. Ask for help if you feel weak or unsteady.  Do not lift anything that is heavier than 10 lb (4.5 kg), or the limit that you are told, until your health care provider says that it is safe.  Return to your normal activities and diet as told by your health care provider. Ask your health care provider what activities are safe for you. General instructions  Do not douche, use tampons, or have sexual intercourse until your health care provider says it is okay  to do so.  Do not use any products that contain nicotine or tobacco, such as cigarettes, e-cigarettes, and chewing tobacco. These can delay incision healing after surgery. If you need help quitting, ask your health care provider.  Keep all follow-up visits as told by your health care provider. This is important. Contact a health care provider if:  You have a fever.  You feel nauseous or you vomit.  You have pain when you urinate or have  blood in your urine.  You have a rash on your body.  You have pain or redness where the IV was inserted.  You have pain that is not relieved with medicine.  You have any of these signs of infection: ? Redness, swelling, or pain around your incisions. ? Fluid or blood coming from your incisions. ? Warmth coming from an incision. ? Pus or a bad smell coming from your incisions. Get help right away if:  You have chest pain or shortness of breath.  You feel dizzy or light-headed.  You have heavy bleeding.  You have increasing abdominal pain that is not relieved with medicines.  You have pain, swelling, or redness in your leg.  Your incision is opening (the edges are not staying together). Summary  After the procedure, it is common to have some pain in your abdomen. You will be given pain medicines to control the pain.  Follow instructions from your health care provider about how to take care of your incisions.  Do not douche, use tampons, or have sexual intercourse until your health care provider says it is okay to do so.  Keep all follow-up visits as told by your health care provider. This is important. This information is not intended to replace advice given to you by your health care provider. Make sure you discuss any questions you have with your health care provider. Document Revised: 01/18/2019 Document Reviewed: 01/18/2019 Elsevier Patient Education  Barbara Arellano.

## 2020-03-20 NOTE — Telephone Encounter (Signed)
-----   Message from Gae Dry, MD sent at 03/20/2020  2:58 PM EDT ----- Regarding: SURGERY Surgery Booking Request Patient Full Name:  Barbara Arellano  MRN: [id number]  DOB: 1975/09/02  Surgeon: Hoyt Koch, MD  Requested Surgery Date and Time: 03/25/20 Primary Diagnosis AND Code: RLQ Pain, Right ovarian Cyst Secondary Diagnosis and Code:  Surgical Procedure: Laparoscopy with Right Ovarian Cystectomy RNFA Requested?: No L&D Notification: No Admission Status: same day surgery Length of Surgery: 50 min Special Case Needs: No H&P: No Phone Interview???:  Yes Interpreter: No Medical Clearance:  No Special Scheduling Instructions: No Any known health/anesthesia issues, diabetes, sleep apnea, latex allergy, defibrillator/pacemaker?: No Acuity: P2 (PAIN)   (P1 highest, P2 delay may cause harm, P3 low, elective gyn, P4 lowest)

## 2020-03-20 NOTE — Telephone Encounter (Signed)
Spoke w patient in clinic to schedule Laparoscopy with Right Ovarian Cystectomy w Kaylyn Layer 9/21  Pre-admit testing call on 9/20 @ early am   Covid testing 9/20 @ after PAT call, [address], drive up and wear mask. Advised pt to quarantine until DOS.  Advised that pt may also receive calls from the hospital pharmacy and pre-service center.  Pt confirmed she is self pay so I offered her the [facility] # [phone]. I adv her to call and discuss pymt plan opts.

## 2020-03-24 ENCOUNTER — Encounter
Admission: RE | Admit: 2020-03-24 | Discharge: 2020-03-24 | Disposition: A | Discharge status: Home / Self Care | Payer: Self-pay | Source: Ambulatory Visit | Attending: Obstetrics & Gynecology | Admitting: Obstetrics & Gynecology

## 2020-03-24 ENCOUNTER — Other Ambulatory Visit: Payer: Self-pay

## 2020-03-24 ENCOUNTER — Other Ambulatory Visit
Admission: RE | Admit: 2020-03-24 | Discharge: 2020-03-24 | Disposition: A | Discharge status: Home / Self Care | Payer: Self-pay | Source: Ambulatory Visit | Attending: Obstetrics & Gynecology | Admitting: Obstetrics & Gynecology

## 2020-03-24 DIAGNOSIS — Z20822 Contact with and (suspected) exposure to covid-19: Secondary | ICD-10-CM | POA: Insufficient documentation

## 2020-03-24 DIAGNOSIS — Z01812 Encounter for preprocedural laboratory examination: Secondary | ICD-10-CM | POA: Insufficient documentation

## 2020-03-24 HISTORY — DX: Depression, unspecified: F32.A

## 2020-03-24 HISTORY — DX: Pneumonia, unspecified organism: J18.9

## 2020-03-24 HISTORY — DX: Anxiety disorder, unspecified: F41.9

## 2020-03-24 LAB — CBC
HCT: 36.6 % (ref 36.0–46.0)
Hemoglobin: 13.3 g/dL (ref 12.0–15.0)
MCH: 33.6 pg (ref 26.0–34.0)
MCHC: 36.3 g/dL — ABNORMAL HIGH (ref 30.0–36.0)
MCV: 92.4 fL (ref 80.0–100.0)
Platelets: 243 10*3/uL (ref 150–400)
RBC: 3.96 MIL/uL (ref 3.87–5.11)
RDW: 12.2 % (ref 11.5–15.5)
WBC: 8.3 10*3/uL (ref 4.0–10.5)
nRBC: 0 % (ref 0.0–0.2)

## 2020-03-24 LAB — TYPE AND SCREEN
ABO/RH(D): B POS
Antibody Screen: NEGATIVE

## 2020-03-24 NOTE — Patient Instructions (Addendum)
Your procedure is scheduled on: Tuesday, September 21 Report to Day Surgery on the 2nd floor of the [facility]. To find out your arrival time, please call [phone] between 1PM - 3PM on: Monday, September 20  REMEMBER: Instructions that are not followed completely may result in serious medical risk, up to and including death; or upon the discretion of your surgeon and anesthesiologist your surgery may need to be rescheduled.  Do not eat food after midnight the night before surgery.  No gum chewing, lozengers or hard candies.  You may however, drink CLEAR liquids up to 2 hours before you are scheduled to arrive for your surgery. Do not drink anything within 2 hours of your scheduled arrival time.  Clear liquids include: - water  - apple juice without pulp - gatorade (not RED) - black coffee or tea (Do NOT add milk or creamers to the coffee or tea) Do NOT drink anything that is not on this list.  DO NOT TAKE ANY MEDICATIONS THE MORNING OF SURGERY   One week prior to surgery: Stop Anti-inflammatories (NSAIDS) such as Advil, Aleve, Ibuprofen, Motrin, Naproxen, Naprosyn and Aspirin based products such as Excedrin, Goodys Powder, BC Powder. Stop ANY OVER THE COUNTER supplements until after surgery.  No Alcohol for 24 hours before or after surgery.  No Smoking including e-cigarettes for 24 hours prior to surgery.  No chewable tobacco products for at least 6 hours prior to surgery.  No nicotine patches on the day of surgery.  Do not use any "recreational" drugs for at least a week prior to your surgery.  Please be advised that the combination of cocaine and anesthesia may have negative outcomes, up to and including death. If you test positive for cocaine, your surgery will be cancelled.  On the morning of surgery brush your teeth with toothpaste and water, you may rinse your mouth with mouthwash if you wish. Do not swallow any toothpaste or mouthwash.  Do not wear jewelry,  make-up, hairpins, clips or nail polish.  Do not wear lotions, powders, or perfumes.   Do not shave 48 hours prior to surgery.   Do not bring valuables to the hospital. [facility] is not responsible for any missing/lost belongings or valuables.   Use CHG Soap as directed on instruction sheet.  Notify your doctor if there is any change in your medical condition (cold, fever, infection).  Wear comfortable clothing (specific to your surgery type) to the hospital.  Plan for stool softeners for home use; pain medications have a tendency to cause constipation. You can also help prevent constipation by eating foods high in fiber such as fruits and vegetables and drinking plenty of fluids as your diet allows.  After surgery, you can help prevent lung complications by doing breathing exercises.  Take deep breaths and cough every 1-2 hours. Your doctor may order a device called an Incentive Spirometer to help you take deep breaths. When coughing or sneezing, hold a pillow firmly against your incision with both hands. This is called "splinting." Doing this helps protect your incision. It also decreases belly discomfort.  If you are being discharged the day of surgery, you will not be allowed to drive home. You will need a responsible adult (18 years or older) to drive you home and stay with you that night.   If you are taking public transportation, you will need to have a responsible adult (18 years or older) with you. Please confirm with your physician that it is acceptable  to use public transportation.   Please call the [place] [facility] at [phone] if you have any questions about these instructions.  Visitation Policy:  Patients undergoing a surgery or procedure may have one family member or support person with them as long as that person is not COVID-19 positive or experiencing its symptoms.  That person may remain in the waiting area during the procedure.  Masking is  required regardless of vaccination status.  Systemwide, no visitors 17 or younger.

## 2020-03-25 ENCOUNTER — Ambulatory Visit
Admission: RE | Admit: 2020-03-25 | Discharge: 2020-03-25 | Disposition: A | Discharge status: Home / Self Care | Payer: Self-pay | Attending: Obstetrics & Gynecology | Admitting: Obstetrics & Gynecology

## 2020-03-25 ENCOUNTER — Ambulatory Visit: Payer: Self-pay | Admitting: Registered Nurse

## 2020-03-25 ENCOUNTER — Encounter
Admission: RE | Disposition: A | Discharge status: Home / Self Care | Payer: Self-pay | Source: Home / Self Care | Attending: Obstetrics & Gynecology

## 2020-03-25 ENCOUNTER — Other Ambulatory Visit: Payer: Self-pay

## 2020-03-25 ENCOUNTER — Encounter: Payer: Self-pay | Admitting: Obstetrics & Gynecology

## 2020-03-25 DIAGNOSIS — F17210 Nicotine dependence, cigarettes, uncomplicated: Secondary | ICD-10-CM | POA: Insufficient documentation

## 2020-03-25 DIAGNOSIS — N83201 Unspecified ovarian cyst, right side: Secondary | ICD-10-CM | POA: Insufficient documentation

## 2020-03-25 DIAGNOSIS — R1031 Right lower quadrant pain: Secondary | ICD-10-CM | POA: Diagnosis present

## 2020-03-25 DIAGNOSIS — N926 Irregular menstruation, unspecified: Secondary | ICD-10-CM | POA: Insufficient documentation

## 2020-03-25 HISTORY — PX: LAPAROSCOPIC LYSIS OF ADHESIONS: SHX5905

## 2020-03-25 LAB — POCT PREGNANCY, URINE: Preg Test, Ur: NEGATIVE

## 2020-03-25 LAB — URINE DRUG SCREEN, QUALITATIVE (ARMC ONLY)
Amphetamines, Ur Screen: NOT DETECTED
Barbiturates, Ur Screen: NOT DETECTED
Benzodiazepine, Ur Scrn: NOT DETECTED
Cannabinoid 50 Ng, Ur ~~LOC~~: POSITIVE — AB
Cocaine Metabolite,Ur ~~LOC~~: NOT DETECTED
MDMA (Ecstasy)Ur Screen: NOT DETECTED
Methadone Scn, Ur: NOT DETECTED
Opiate, Ur Screen: NOT DETECTED
Phencyclidine (PCP) Ur S: NOT DETECTED
Tricyclic, Ur Screen: NOT DETECTED

## 2020-03-25 LAB — PREGNANCY, URINE: Preg Test, Ur: NEGATIVE

## 2020-03-25 LAB — ABO/RH: ABO/RH(D): B POS

## 2020-03-25 LAB — SARS CORONAVIRUS 2 (TAT 6-24 HRS): SARS Coronavirus 2: NEGATIVE

## 2020-03-25 SURGERY — LYSIS, ADHESIONS, LAPAROSCOPIC
Anesthesia: General | Laterality: Right

## 2020-03-25 MED ORDER — OXYCODONE-ACETAMINOPHEN 5-325 MG PO TABS: 1.0000 | ORAL_TABLET | ORAL | Status: DC | PRN

## 2020-03-25 MED ORDER — LACTATED RINGERS IV SOLN: INTRAVENOUS | Status: DC

## 2020-03-25 MED ORDER — ALBUTEROL SULFATE HFA 108 (90 BASE) MCG/ACT IN AERS
INHALATION_SPRAY | RESPIRATORY_TRACT | Status: AC
  Filled 2020-03-25: qty 6.7

## 2020-03-25 MED ORDER — BUPIVACAINE HCL (PF) 0.5 % IJ SOLN
INTRAMUSCULAR | Status: AC
  Filled 2020-03-25: qty 30

## 2020-03-25 MED ORDER — MORPHINE SULFATE (PF) 2 MG/ML IV SOLN: 1.0000 mg | INTRAVENOUS | Status: DC | PRN

## 2020-03-25 MED ORDER — ORAL CARE MOUTH RINSE: 15.0000 mL | Freq: Once | OROMUCOSAL | Status: AC

## 2020-03-25 MED ORDER — OXYCODONE-ACETAMINOPHEN 5-325 MG PO TABS
1.0000 | ORAL_TABLET | ORAL | 0 refills | Status: DC | PRN
Start: 2020-03-25 — End: 2020-06-03

## 2020-03-25 MED ORDER — IPRATROPIUM-ALBUTEROL 0.5-2.5 (3) MG/3ML IN SOLN
RESPIRATORY_TRACT | Status: AC
  Administered 2020-03-25: 3 mL
  Filled 2020-03-25: qty 3

## 2020-03-25 MED ORDER — SODIUM CHLORIDE 0.9 % IV SOLN
INTRAVENOUS | Status: AC
  Filled 2020-03-25: qty 2

## 2020-03-25 MED ORDER — BUPIVACAINE HCL (PF) 0.5 % IJ SOLN
INTRAMUSCULAR | Status: DC | PRN
  Administered 2020-03-25: 16 mL

## 2020-03-25 MED ORDER — SODIUM CHLORIDE 0.9 % IV SOLN
2.0000 g | INTRAVENOUS | Status: AC
  Administered 2020-03-25: 2 g via INTRAVENOUS

## 2020-03-25 MED ORDER — SODIUM CHLORIDE (PF) 0.9 % IJ SOLN
INTRAMUSCULAR | Status: AC
  Filled 2020-03-25: qty 10

## 2020-03-25 MED ORDER — PROPOFOL 500 MG/50ML IV EMUL
INTRAVENOUS | Status: DC | PRN
  Administered 2020-03-25: 175 ug/kg/min via INTRAVENOUS

## 2020-03-25 MED ORDER — ONDANSETRON HCL 4 MG/2ML IJ SOLN
INTRAMUSCULAR | Status: DC | PRN
  Administered 2020-03-25: 4 mg via INTRAVENOUS

## 2020-03-25 MED ORDER — FENTANYL CITRATE (PF) 100 MCG/2ML IJ SOLN
INTRAMUSCULAR | Status: AC
  Filled 2020-03-25: qty 2

## 2020-03-25 MED ORDER — ACETAMINOPHEN 650 MG RE SUPP
650.0000 mg | RECTAL | Status: DC | PRN
  Filled 2020-03-25: qty 1

## 2020-03-25 MED ORDER — ONDANSETRON HCL 4 MG/2ML IJ SOLN: 4.0000 mg | Freq: Once | INTRAMUSCULAR | Status: DC | PRN

## 2020-03-25 MED ORDER — DEXMEDETOMIDINE HCL 200 MCG/2ML IV SOLN
INTRAVENOUS | Status: DC | PRN
  Administered 2020-03-25: 12 ug via INTRAVENOUS
  Administered 2020-03-25: 8 ug via INTRAVENOUS

## 2020-03-25 MED ORDER — FENTANYL CITRATE (PF) 100 MCG/2ML IJ SOLN
25.0000 ug | INTRAMUSCULAR | Status: DC | PRN
  Administered 2020-03-25: 50 ug via INTRAVENOUS
  Administered 2020-03-25: 25 ug via INTRAVENOUS

## 2020-03-25 MED ORDER — POVIDONE-IODINE 10 % EX SWAB
2.0000 "application " | Freq: Once | CUTANEOUS | Status: AC
  Administered 2020-03-25: 2 via TOPICAL

## 2020-03-25 MED ORDER — DEXAMETHASONE SODIUM PHOSPHATE 10 MG/ML IJ SOLN
INTRAMUSCULAR | Status: DC | PRN
  Administered 2020-03-25: 8 mg via INTRAVENOUS

## 2020-03-25 MED ORDER — OXYCODONE HCL 5 MG/5ML PO SOLN: 5.0000 mg | Freq: Once | ORAL | Status: DC | PRN

## 2020-03-25 MED ORDER — ROCURONIUM BROMIDE 10 MG/ML (PF) SYRINGE
PREFILLED_SYRINGE | INTRAVENOUS | Status: AC
  Filled 2020-03-25: qty 10

## 2020-03-25 MED ORDER — OXYCODONE HCL 5 MG PO TABS: 5.0000 mg | ORAL_TABLET | Freq: Once | ORAL | Status: DC | PRN

## 2020-03-25 MED ORDER — MIDAZOLAM HCL 2 MG/2ML IJ SOLN
INTRAMUSCULAR | Status: DC | PRN
  Administered 2020-03-25: 2 mg via INTRAVENOUS

## 2020-03-25 MED ORDER — MIDAZOLAM HCL 2 MG/2ML IJ SOLN
INTRAMUSCULAR | Status: AC
  Filled 2020-03-25: qty 2

## 2020-03-25 MED ORDER — FAMOTIDINE 20 MG PO TABS
ORAL_TABLET | ORAL | Status: AC
  Administered 2020-03-25: 20 mg via ORAL
  Filled 2020-03-25: qty 1

## 2020-03-25 MED ORDER — CHLORHEXIDINE GLUCONATE 0.12 % MT SOLN
OROMUCOSAL | Status: AC
  Administered 2020-03-25: 15 mL via OROMUCOSAL
  Filled 2020-03-25: qty 15

## 2020-03-25 MED ORDER — PROPOFOL 10 MG/ML IV BOLUS
INTRAVENOUS | Status: AC
  Filled 2020-03-25: qty 60

## 2020-03-25 MED ORDER — ROCURONIUM BROMIDE 100 MG/10ML IV SOLN
INTRAVENOUS | Status: DC | PRN
  Administered 2020-03-25: 10 mg via INTRAVENOUS
  Administered 2020-03-25: 50 mg via INTRAVENOUS

## 2020-03-25 MED ORDER — ONDANSETRON HCL 4 MG/2ML IJ SOLN
INTRAMUSCULAR | Status: AC
  Filled 2020-03-25: qty 2

## 2020-03-25 MED ORDER — FENTANYL CITRATE (PF) 100 MCG/2ML IJ SOLN
INTRAMUSCULAR | Status: AC
  Administered 2020-03-25: 25 ug via INTRAVENOUS
  Filled 2020-03-25: qty 2

## 2020-03-25 MED ORDER — DEXAMETHASONE SODIUM PHOSPHATE 10 MG/ML IJ SOLN
INTRAMUSCULAR | Status: AC
  Filled 2020-03-25: qty 1

## 2020-03-25 MED ORDER — ALBUTEROL SULFATE HFA 108 (90 BASE) MCG/ACT IN AERS
INHALATION_SPRAY | RESPIRATORY_TRACT | Status: DC | PRN
  Administered 2020-03-25: 4 via RESPIRATORY_TRACT

## 2020-03-25 MED ORDER — FENTANYL CITRATE (PF) 100 MCG/2ML IJ SOLN
INTRAMUSCULAR | Status: DC | PRN
Start: 2020-03-25 — End: 2020-03-25
  Administered 2020-03-25: 25 ug via INTRAVENOUS
  Administered 2020-03-25: 50 ug via INTRAVENOUS
  Administered 2020-03-25: 25 ug via INTRAVENOUS

## 2020-03-25 MED ORDER — PROPOFOL 10 MG/ML IV BOLUS
INTRAVENOUS | Status: DC | PRN
  Administered 2020-03-25: 150 mg via INTRAVENOUS

## 2020-03-25 MED ORDER — LIDOCAINE HCL (PF) 2 % IJ SOLN
INTRAMUSCULAR | Status: AC
  Filled 2020-03-25: qty 5

## 2020-03-25 MED ORDER — KETAMINE HCL 10 MG/ML IJ SOLN
INTRAMUSCULAR | Status: DC | PRN
  Administered 2020-03-25 (×2): 20 mg via INTRAVENOUS

## 2020-03-25 MED ORDER — ACETAMINOPHEN 325 MG PO TABS: 650.0000 mg | ORAL_TABLET | ORAL | Status: DC | PRN

## 2020-03-25 MED ORDER — CHLORHEXIDINE GLUCONATE 0.12 % MT SOLN: 15.0000 mL | Freq: Once | OROMUCOSAL | Status: AC

## 2020-03-25 MED ORDER — SCOPOLAMINE 1 MG/3DAYS TD PT72
MEDICATED_PATCH | TRANSDERMAL | Status: AC
  Filled 2020-03-25: qty 1

## 2020-03-25 MED ORDER — SCOPOLAMINE 1 MG/3DAYS TD PT72
1.0000 | MEDICATED_PATCH | TRANSDERMAL | Status: DC
  Administered 2020-03-25: 1.5 mg via TRANSDERMAL

## 2020-03-25 MED ORDER — KETOROLAC TROMETHAMINE 30 MG/ML IJ SOLN
INTRAMUSCULAR | Status: AC
  Filled 2020-03-25: qty 1

## 2020-03-25 MED ORDER — DEXMEDETOMIDINE (PRECEDEX) IN NS 20 MCG/5ML (4 MCG/ML) IV SYRINGE
PREFILLED_SYRINGE | INTRAVENOUS | Status: AC
  Filled 2020-03-25: qty 5

## 2020-03-25 MED ORDER — LIDOCAINE HCL (CARDIAC) PF 100 MG/5ML IV SOSY
PREFILLED_SYRINGE | INTRAVENOUS | Status: DC | PRN
  Administered 2020-03-25: 80 mg via INTRAVENOUS
  Administered 2020-03-25: 20 mg via INTRAVENOUS

## 2020-03-25 MED ORDER — KETOROLAC TROMETHAMINE 30 MG/ML IJ SOLN
INTRAMUSCULAR | Status: DC | PRN
  Administered 2020-03-25: 30 mg via INTRAVENOUS

## 2020-03-25 MED ORDER — FAMOTIDINE 20 MG PO TABS: 20.0000 mg | ORAL_TABLET | Freq: Once | ORAL | Status: AC

## 2020-03-25 MED ORDER — SUGAMMADEX SODIUM 200 MG/2ML IV SOLN
INTRAVENOUS | Status: DC | PRN
  Administered 2020-03-25: 200 mg via INTRAVENOUS

## 2020-03-25 MED ORDER — PROPOFOL 10 MG/ML IV BOLUS
INTRAVENOUS | Status: AC
  Filled 2020-03-25: qty 40

## 2020-03-25 SURGICAL SUPPLY — 44 items
ADH SKN CLS APL DERMABOND .7 (GAUZE/BANDAGES/DRESSINGS) ×1
APL PRP STRL LF DISP 70% ISPRP (MISCELLANEOUS) ×1
BAG SPEC RTRVL LRG 6X4 10 (ENDOMECHANICALS)
BLADE SURG SZ11 CARB STEEL (BLADE) ×3 IMPLANT
CANISTER SUCT 1200ML W/VALVE (MISCELLANEOUS) ×1 IMPLANT
CATH ROBINSON RED A/P 16FR (CATHETERS) ×3 IMPLANT
CHLORAPREP W/TINT 26 (MISCELLANEOUS) ×3 IMPLANT
COVER WAND RF STERILE (DRAPES) ×3 IMPLANT
DERMABOND ADVANCED (GAUZE/BANDAGES/DRESSINGS) ×2
DERMABOND ADVANCED .7 DNX12 (GAUZE/BANDAGES/DRESSINGS) ×1 IMPLANT
DRSG TEGADERM 2-3/8X2-3/4 SM (GAUZE/BANDAGES/DRESSINGS) ×8 IMPLANT
DRSG TELFA 4X3 1S NADH ST (GAUZE/BANDAGES/DRESSINGS) IMPLANT
GAUZE 4X4 16PLY RFD (DISPOSABLE) ×3 IMPLANT
GLOVE BIO SURGEON STRL SZ8 (GLOVE) ×3 IMPLANT
GLOVE INDICATOR 8.0 STRL GRN (GLOVE) ×3 IMPLANT
GOWN STRL REUS W/ TWL LRG LVL3 (GOWN DISPOSABLE) ×1 IMPLANT
GOWN STRL REUS W/ TWL XL LVL3 (GOWN DISPOSABLE) ×1 IMPLANT
GOWN STRL REUS W/TWL LRG LVL3 (GOWN DISPOSABLE) ×3
GOWN STRL REUS W/TWL XL LVL3 (GOWN DISPOSABLE) ×3
IRRIGATION STRYKERFLOW (MISCELLANEOUS) IMPLANT
IRRIGATOR STRYKERFLOW (MISCELLANEOUS)
IV LACTATED RINGERS 1000ML (IV SOLUTION) IMPLANT
KIT PINK PAD W/HEAD ARE REST (MISCELLANEOUS) ×3
KIT PINK PAD W/HEAD ARM REST (MISCELLANEOUS) ×1 IMPLANT
LABEL OR SOLS (LABEL) ×3 IMPLANT
MANIFOLD NEPTUNE II (INSTRUMENTS) ×2 IMPLANT
NEEDLE VERESS 14GA 120MM (NEEDLE) ×3 IMPLANT
NS IRRIG 500ML POUR BTL (IV SOLUTION) ×3 IMPLANT
PACK GYN LAPAROSCOPIC (MISCELLANEOUS) ×3 IMPLANT
PAD PREP 24X41 OB/GYN DISP (PERSONAL CARE ITEMS) ×3 IMPLANT
POUCH SPECIMEN RETRIEVAL 10MM (ENDOMECHANICALS) IMPLANT
SCISSORS METZENBAUM CVD 33 (INSTRUMENTS) ×3 IMPLANT
SET TUBE SMOKE EVAC HIGH FLOW (TUBING) ×3 IMPLANT
SHEARS HARMONIC ACE PLUS 36CM (ENDOMECHANICALS) ×2 IMPLANT
SLEEVE ENDOPATH XCEL 5M (ENDOMECHANICALS) ×2 IMPLANT
SOL PREP PROV IODINE SCRUB 4OZ (MISCELLANEOUS) ×1 IMPLANT
SPONGE GAUZE 2X2 8PLY STER LF (GAUZE/BANDAGES/DRESSINGS)
SPONGE GAUZE 2X2 8PLY STRL LF (GAUZE/BANDAGES/DRESSINGS) IMPLANT
STRAP SAFETY 5IN WIDE (MISCELLANEOUS) ×3 IMPLANT
SUT VIC AB 2-0 UR6 27 (SUTURE) IMPLANT
SUT VIC AB 4-0 PS2 18 (SUTURE) IMPLANT
SYR 10ML LL (SYRINGE) ×3 IMPLANT
TROCAR ENDO BLADELESS 11MM (ENDOMECHANICALS) IMPLANT
TROCAR XCEL NON-BLD 5MMX100MML (ENDOMECHANICALS) ×3 IMPLANT

## 2020-03-25 NOTE — Discharge Instructions (Signed)
Ovarian Cystectomy, Care After This sheet gives you information about how to care for yourself after your procedure. Your health care provider may also give you more specific instructions. If you have problems or questions, contact your health care provider. What can I expect after the procedure? After the procedure, it is common to have:  Pain in the abdomen, especially at the incision areas. You will be given pain medicines to control the pain.  Tiredness. This is a normal part of the recovery process. Your energy level will return to normal over the next several weeks. Follow these instructions at home: Medicines  Take over-the-counter and prescription medicines only as told by your health care provider.  If you were prescribed an antibiotic medicine, use it as told by your health care provider. Do not stop using the antibiotic even if you start to feel better.  Do not take aspirin because it can cause bleeding.  Ask your health care provider if the medicine prescribed to you: ? Requires you to avoid driving or using heavy machinery. ? Can cause constipation. You may need to take these actions to prevent or treat constipation:  Drink enough fluid to keep your urine pale yellow.  Take over-the-counter or prescription medicines.  Eat foods that are high in fiber, such as beans, whole grains, and fresh fruits and vegetables.  Limit foods that are high in fat and processed sugars, such as fried or sweet foods. Incision care   Follow instructions from your health care provider about how to take care of your incisions. Make sure you: ? Wash your hands with soap and water before and after you change your bandage (dressing). If soap and water are not available, use hand sanitizer. ? Change your dressing as told by your health care provider. ? Leave stitches (sutures), skin glue, or adhesive strips in place. These skin closures may need to stay in place for 2 weeks or longer. If adhesive  strip edges start to loosen and curl up, you may trim the loose edges. Do not remove adhesive strips completely unless your health care provider tells you to do that.  Check your incision areas every day for signs of infection. Check for: ? Redness, swelling, or pain. ? Fluid or blood. ? Warmth. ? Pus or a bad smell.  Do not take baths, swim, or use a hot tub until your health care provider approves. Ask your health care provider if you may take showers. You may only be allowed to take sponge baths. Activity  Rest as told by your health care provider.  Avoid sitting for a long time without moving. Get up to take short walks every 1-2 hours. This is important to improve blood flow and breathing. Ask for help if you feel weak or unsteady.  Do not lift anything that is heavier than 10 lb (4.5 kg), or the limit that you are told, until your health care provider says that it is safe.  Return to your normal activities and diet as told by your health care provider. Ask your health care provider what activities are safe for you. General instructions  Do not douche, use tampons, or have sexual intercourse until your health care provider says it is okay to do so.  Do not use any products that contain nicotine or tobacco, such as cigarettes, e-cigarettes, and chewing tobacco. These can delay incision healing after surgery. If you need help quitting, ask your health care provider.  Keep all follow-up visits as told by your  health care provider. This is important. Contact a health care provider if:  You have a fever.  You feel nauseous or you vomit.  You have pain when you urinate or have blood in your urine.  You have a rash on your body.  You have pain or redness where the IV was inserted.  You have pain that is not relieved with medicine.  You have any of these signs of infection: ? Redness, swelling, or pain around your incisions. ? Fluid or blood coming from your incisions. ? Warmth  coming from an incision. ? Pus or a bad smell coming from your incisions. Get help right away if:  You have chest pain or shortness of breath.  You feel dizzy or light-headed.  You have heavy bleeding.  You have increasing abdominal pain that is not relieved with medicines.  You have pain, swelling, or redness in your leg.  Your incision is opening (the edges are not staying together). Summary  After the procedure, it is common to have some pain in your abdomen. You will be given pain medicines to control the pain.  Follow instructions from your health care provider about how to take care of your incisions.  Do not douche, use tampons, or have sexual intercourse until your health care provider says it is okay to do so.  Keep all follow-up visits as told by your health care provider. This is important. This information is not intended to replace advice given to you by your health care provider. Make sure you discuss any questions you have with your health care provider. Document Revised: 01/18/2019 Document Reviewed: 01/18/2019 Elsevier Patient Education  [place]   1) The drugs that you were given will stay in your system until tomorrow so for the next 24 hours you should not:  A) Drive an automobile B) Make any legal decisions C) Drink any alcoholic beverage   2) You may resume regular meals tomorrow.  Today it is better to start with liquids and gradually work up to solid foods.  You may eat anything you prefer, but it is better to start with liquids, then soup and crackers, and gradually work up to solid foods.   3) Please notify your doctor immediately if you have any unusual bleeding, trouble breathing, redness and pain at the surgery site, drainage, fever, or pain not relieved by medication.    4) Additional Instructions:        Please contact your physician with any problems or Same Day Surgery at  [phone], Monday through Friday 6 am to 4 pm, or [place] at [facility] number at [phone].

## 2020-03-25 NOTE — Interval H&P Note (Signed)
History and Physical Interval Note:  03/25/2020 11:53 AM  Barbara Arellano  has presented today for surgery, with the diagnosis of RLQ Pain, Right ovarian Cyst.  The various methods of treatment have been discussed with the patient and family. After consideration of risks, benefits and other options for treatment, the patient has consented to  Procedure(s): Laparoscopy with Right Ovarian Cystectomy (Right) as a surgical intervention.  The patient's history has been reviewed, patient examined, no change in status, stable for surgery.  I have reviewed the patient's chart and labs.  Questions were answered to the patient's satisfaction.     Hoyt Koch

## 2020-03-25 NOTE — Op Note (Signed)
  Operative Note   03/25/2020  PRE-OP DIAGNOSIS: Right Ovarian Cyst, Pelvic Pain   POST-OP DIAGNOSIS: same   PROCEDURE: Procedure(s): Laparoscopy with Right Ovarian Cystectomy   SURGEON: Barnett Applebaum, MD, FACOG  ANESTHESIA: General   ESTIMATED BLOOD LOSS: Min  COMPLICATIONS: None  DISPOSITION: PACU - hemodynamically stable.  CONDITION: stable  FINDINGS: Laparoscopic survey of the abdomen revealed a grossly normal uterus, tubes, liver edge, gallbladder edge and appendix, No intra-abdominal adhesions were noted. Right Ovarian Cyst noted.  Absent left ovary.  PROCEDURE IN DETAIL: The patient was taken to the OR where anesthesia was administed. The patient was positioned in dorsal lithotomy in the [place]. The patient was then examined under anesthesia with the above noted findings. The patient was prepped and draped in the normal sterile fashion and foley catheter was placed. A Graves speculum was placed in the vagina and a sponge stick was placed vaginally. Uterine mobility was found to be satisfactory. The speculum was then removed.  Attention was turned to the patient's abdomen where a 5 mm skin incision was made in the umbilical fold, after injection of local anesthesia. The Veress step needle was carefully introduced into the peritoneal cavity with placement confirmed using the hanging drop technique.  Pneumoperitoneum was obtained. The 5 mm port was then placed under direct visualization with the operative laparoscope.  Trendelenburg positioning.  Additional 10mm trocar was then placed in the RLQ lateral to the inferior epigastric blood vessels under direct visualization with the laparoscope.  Instrumentation to visualize complete pelvic anatomy performed.  A 60mm trocar was also then placed in the suprapubic region.  The ovarian cyst is identified and stabilized.  An incision is made with clear fluid noted.  Fluid is aspirated.  Cyst wall is dissected free from the ovarian cortex  and removed.  Hemostasis is visualized and assured.  Contralateral ovary seen as absent.  Pelvic cavity is cleaned with any fluid aspirated.  Instruments and trocars removed, gas expelled, and skin closed with skin adhesive glue.  Instrument, needle, and sponge counts correct x2 at the conclusion of the case.  Pt goes to recovery room in stable condition.  Barnett Applebaum, MD, [facility], [facility] 03/25/2020  1:48 PM

## 2020-03-25 NOTE — Transfer of Care (Signed)
Immediate Anesthesia Transfer of Care Note  Patient: Barbara Arellano  Procedure(s) Performed: Laparoscopy with Right Ovarian Cystectomy (Right )  Patient Location: PACU  Anesthesia Type:General  Level of Consciousness: drowsy  Airway & Oxygen Therapy: Patient Spontanous Breathing and Patient connected to face mask oxygen  Post-op Assessment: Report given to RN and Post -op Vital signs reviewed and stable  Post vital signs: Reviewed and stable  Last Vitals:  Vitals Value Taken Time  BP 110/97 03/25/20 1354  Temp 36.5 C 03/25/20 1345  Pulse 72 03/25/20 1354  Resp 29 03/25/20 1354  SpO2 98 % 03/25/20 1354  Vitals shown include unvalidated device data.  Last Pain:  Vitals:   03/25/20 1140  TempSrc: Oral  PainSc: 10-Worst pain ever         Complications: No complications documented.   Patient noted to have repeated coughing after extubation and continuing into PACU, albuterol given, oropharyngeal secretions suctioned repeatedly.  Dr. Lubertha Basque aware and DuoNeb to be given in PACU.

## 2020-03-25 NOTE — Anesthesia Procedure Notes (Signed)
Procedure Name: Intubation Date/Time: 03/25/2020 12:39 PM Performed by: Lia Foyer, CRNA Pre-anesthesia Checklist: Patient identified, Emergency Drugs available, Suction available and Patient being monitored Patient Re-evaluated:Patient Re-evaluated prior to induction Oxygen Delivery Method: Circle system utilized Preoxygenation: Pre-oxygenation with 100% oxygen Induction Type: IV induction Ventilation: Mask ventilation without difficulty Laryngoscope Size: McGraph and 4 Tube type: Oral Tube size: 7.0 mm Number of attempts: 1 Airway Equipment and Method: Stylet,  Oral airway and Video-laryngoscopy Placement Confirmation: ETT inserted through vocal cords under direct vision,  positive ETCO2 and breath sounds checked- equal and bilateral Secured at: 21 cm Tube secured with: Tape Dental Injury: Teeth and Oropharynx as per pre-operative assessment

## 2020-03-25 NOTE — Anesthesia Preprocedure Evaluation (Addendum)
Anesthesia Evaluation  Patient identified by MRN, date of birth, ID band Patient awake    Reviewed: Allergy & Precautions, H&P , NPO status , Patient's Chart, lab work & pertinent test results  History of Anesthesia Complications Negative for: history of anesthetic complications  Airway Mallampati: II  TM Distance: >3 FB     Dental  (+) Poor Dentition   Pulmonary neg sleep apnea, neg COPD, Current Smoker and Patient abstained from smoking.,    breath sounds clear to auscultation       Cardiovascular (-) angina(-) Past MI and (-) Cardiac Stents negative cardio ROS  (-) dysrhythmias  Rhythm:regular Rate:Normal     Neuro/Psych PSYCHIATRIC DISORDERS Anxiety Depression negative neurological ROS     GI/Hepatic negative GI ROS, Neg liver ROS,   Endo/Other  negative endocrine ROS  Renal/GU      Musculoskeletal   Abdominal   Peds  Hematology negative hematology ROS (+)   Anesthesia Other Findings Past Medical History: No date: Anxiety No date: Cervical arthritis No date: Depression 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, uterine No date: Pneumonia  Past Surgical History: No date: BILATERAL SALPINGECTOMY     Comment:  age 76 12/15/2017: BREAST BIOPSY; Right     Comment:  neg No date: FRACTURE SURGERY; Left     Comment:  foot 2015: OOPHORECTOMY; Left     Comment:  ruptured cyst     Reproductive/Obstetrics negative OB ROS                            Anesthesia Physical Anesthesia Plan  ASA: II  Anesthesia Plan: General ETT   Post-op Pain Management:    Induction:   PONV Risk Score and Plan: Ondansetron, Dexamethasone, Midazolam and Treatment may vary due to age or medical condition  Airway Management Planned:   Additional Equipment:   Intra-op Plan:   Post-operative Plan:   Informed Consent: I have reviewed the patients History and Physical, chart, labs and discussed the procedure including  the risks, benefits and alternatives for the proposed anesthesia with the patient or authorized representative who has indicated his/her understanding and acceptance.     Dental Advisory Given  Plan Discussed with: Anesthesiologist, CRNA and Surgeon  Anesthesia Plan Comments:        Anesthesia Quick Evaluation

## 2020-03-26 ENCOUNTER — Encounter: Payer: Self-pay | Admitting: Obstetrics & Gynecology

## 2020-03-26 NOTE — Anesthesia Postprocedure Evaluation (Signed)
Anesthesia Post Note  Patient: Barbara Arellano  Procedure(s) Performed: Laparoscopy with Right Ovarian Cystectomy (Right )  Patient location during evaluation: PACU Anesthesia Type: General Level of consciousness: awake and alert Pain management: pain level controlled Vital Signs Assessment: post-procedure vital signs reviewed and stable Respiratory status: spontaneous breathing, nonlabored ventilation and respiratory function stable Cardiovascular status: blood pressure returned to baseline and stable Postop Assessment: no apparent nausea or vomiting Anesthetic complications: no   No complications documented.   Last Vitals:  Vitals:   03/25/20 1458 03/25/20 1545  BP: 126/73 105/71  Pulse: 80 65  Resp: 16   Temp: (!) 36.3 C   SpO2: 100% 100%    Last Pain:  Vitals:   03/25/20 1601  TempSrc:   PainSc: [place]

## 2020-03-27 LAB — SURGICAL PATHOLOGY

## 2020-04-02 ENCOUNTER — Ambulatory Visit (INDEPENDENT_AMBULATORY_CARE_PROVIDER_SITE_OTHER): Payer: Self-pay | Admitting: Obstetrics & Gynecology

## 2020-04-02 ENCOUNTER — Encounter: Payer: Self-pay | Admitting: Obstetrics & Gynecology

## 2020-04-02 ENCOUNTER — Other Ambulatory Visit: Payer: Self-pay

## 2020-04-02 VITALS — BP 100/60 | Ht 64.0 in | Wt 176.0 lb

## 2020-04-02 DIAGNOSIS — R1031 Right lower quadrant pain: Secondary | ICD-10-CM

## 2020-04-02 DIAGNOSIS — N83201 Unspecified ovarian cyst, right side: Secondary | ICD-10-CM

## 2020-04-02 NOTE — Progress Notes (Signed)
  Postoperative Follow-up Patient presents post op from right ovarian cystectomy for pelvic pain and cyst, 1 week ago. Path: DIAGNOSIS:  A. OVARY, RIGHT; CYSTECTOMY:  - FRAGMENTS OF BENIGN OVARIAN CYST WALL, MOST COMPATIBLE WITH  HEMORRHAGIC FOLLICULAR CYST.  - NEGATIVE FOR ATYPIA AND MALIGNANCY.  Images:    Subjective: Patient reports marked improvement in her preop symptoms. Eating a regular diet without difficulty. The patient is not having any pain.  Activity: normal activities of daily living. Patient reports additional symptom's since surgery of None.  Objective: BP 100/60   Ht 5\' 4"  (1.626 m)   Wt 176 lb (79.8 kg)   BMI 30.21 kg/m  Physical Exam Constitutional:      General: She is not in acute distress.    Appearance: She is well-developed.  Cardiovascular:     Rate and Rhythm: Normal rate.  Pulmonary:     Effort: Pulmonary effort is normal.  Abdominal:     General: There is no distension.     Palpations: Abdomen is soft.     Tenderness: There is no abdominal tenderness.     Comments: Incision Healing Well   Musculoskeletal:        General: Normal range of motion.  Neurological:     Mental Status: She is alert and oriented to person, place, and time.     Cranial Nerves: No cranial nerve deficit.  Skin:    General: Skin is warm and dry.     Assessment: s/p :  right ovarian cystectomy progressing well  Plan: Patient has done well after surgery with no apparent complications.  I have discussed the post-operative course to date, and the expected progress moving forward.  The patient understands what complications to be concerned about.  I will see the patient in routine follow up, or sooner if needed.    Activity plan: No restriction.  Annual PAP soon    Also needs MMG.  Is going to check w [facility], as has been seen there years ago. Also needs to see PCP (risk factors- heart disease, smoking)  Hoyt Koch 04/02/2020, 9:48 AM

## 2020-06-03 ENCOUNTER — Ambulatory Visit: Payer: Self-pay | Admitting: Obstetrics & Gynecology

## 2020-06-03 ENCOUNTER — Other Ambulatory Visit: Payer: Self-pay

## 2020-06-03 ENCOUNTER — Encounter: Payer: Self-pay | Admitting: Emergency Medicine

## 2020-06-03 ENCOUNTER — Emergency Department
Admission: EM | Admit: 2020-06-03 | Discharge: 2020-06-03 | Disposition: A | Discharge status: Home / Self Care | Payer: Self-pay | Attending: Emergency Medicine | Admitting: Emergency Medicine

## 2020-06-03 ENCOUNTER — Emergency Department: Payer: Self-pay

## 2020-06-03 DIAGNOSIS — G8929 Other chronic pain: Secondary | ICD-10-CM | POA: Insufficient documentation

## 2020-06-03 DIAGNOSIS — L03115 Cellulitis of right lower limb: Secondary | ICD-10-CM | POA: Insufficient documentation

## 2020-06-03 DIAGNOSIS — L03119 Cellulitis of unspecified part of limb: Secondary | ICD-10-CM

## 2020-06-03 DIAGNOSIS — M25561 Pain in right knee: Secondary | ICD-10-CM

## 2020-06-03 DIAGNOSIS — F17210 Nicotine dependence, cigarettes, uncomplicated: Secondary | ICD-10-CM | POA: Insufficient documentation

## 2020-06-03 MED ORDER — CEPHALEXIN 500 MG PO CAPS: 500.0000 mg | ORAL_CAPSULE | Freq: Three times a day (TID) | ORAL | 0 refills | Status: AC

## 2020-06-03 MED ORDER — DICLOFENAC SODIUM 50 MG PO TBEC
50.0000 mg | DELAYED_RELEASE_TABLET | Freq: Two times a day (BID) | ORAL | 1 refills | Status: DC
Start: 2020-06-03 — End: 2020-10-25

## 2020-06-03 MED ORDER — BACLOFEN 10 MG PO TABS: 10.0000 mg | ORAL_TABLET | Freq: Three times a day (TID) | ORAL | 1 refills | Status: AC

## 2020-06-03 NOTE — Discharge Instructions (Addendum)
Your XR is negative. Take the prescription meds as directed. Follow-up with your provider as discussed.

## 2020-06-03 NOTE — ED Provider Notes (Signed)
[facility] Emergency Department Provider Note ____________________________________________  Time seen: 1249  I have reviewed the triage vital signs and the nursing notes.  HISTORY  Chief Complaint  Knee Pain  HPI Barbara Arellano is a 44 y.o. female patient with ED evaluation  of chronic right knee pain.  Patient denies any recent injury, trauma, fall patient also reports that she has been working more shifts, and therefore walking a lot.  Feels like her right knee locks up patient localizes pain to the anterior inferior portion of the knee.  She denies any history of knee pain, surgery, arthritis.  She is not been evaluated in the past for her symptoms.  Past Medical History:  Diagnosis Date  . Anxiety   . Cervical arthritis   . Depression   . Fibroid, uterine   . Pneumonia     Patient Active Problem List   Diagnosis Date Noted  . Right lower quadrant pain 03/25/2020  . Right ovarian cyst 03/20/2020  . Vaginal bleeding 12/05/2017  . Iron deficiency anemia due to chronic blood loss 12/05/2017    Past Surgical History:  Procedure Laterality Date  . BILATERAL SALPINGECTOMY     age 60  . BREAST BIOPSY Right 12/15/2017   neg  . FRACTURE SURGERY Left    foot  . LAPAROSCOPIC LYSIS OF ADHESIONS Right 03/25/2020   Procedure: Laparoscopy with Right Ovarian Cystectomy;  Surgeon: Gae Dry, MD;  Location: [facility] ORS;  Service: Gynecology;  Laterality: Right;  . OOPHORECTOMY Left 2015   ruptured cyst    Prior to Admission medications   Medication Sig Start Date End Date Taking? Authorizing Provider  baclofen (LIORESAL) 10 MG tablet Take 1 tablet (10 mg total) by mouth 3 (three) times daily for 20 days. 06/03/20 06/23/20  Lavert Matousek, Dannielle Karvonen, PA-C  cephALEXin (KEFLEX) 500 MG capsule Take 1 capsule (500 mg total) by mouth 3 (three) times daily for 7 days. 06/03/20 06/10/20  Grove Defina, Dannielle Karvonen, PA-C  diclofenac (VOLTAREN) 50 MG EC tablet Take 1  tablet (50 mg total) by mouth 2 (two) times daily. 06/03/20   Zackrey Dyar, Dannielle Karvonen, PA-C    Allergies Patient has no known allergies.  Family History  Problem Relation Age of Onset  . Heart disease Mother   . Cancer Father 26       bone marrow  . Heart disease Father   . Breast cancer Maternal Aunt        late 1's    Social History Social History   Tobacco Use  . Smoking status: Current Every Day Smoker    Packs/day: 0.50    Types: Cigarettes  . Smokeless tobacco: Never Used  Vaping Use  . Vaping Use: Never used  Substance Use Topics  . Alcohol use: No  . Drug use: Yes    Types: Marijuana    Comment: occassional    Review of Systems  Constitutional: Negative for fever. Cardiovascular: Negative for chest pain. Respiratory: Negative for shortness of breath. Gastrointestinal: Negative for abdominal pain, vomiting and diarrhea. Genitourinary: Negative for dysuria. Musculoskeletal: Negative for back pain.  Right knee pain as above. Skin: Negative for rash. Neurological: Negative for headaches, focal weakness or numbness. ____________________________________________  PHYSICAL EXAM:  VITAL SIGNS: ED Triage Vitals  Enc Vitals Group     BP 06/03/20 1222 112/72     Pulse Rate 06/03/20 1222 63     Resp 06/03/20 1222 18     Temp 06/03/20 1222 97.9  F (36.6 C)     Temp Source 06/03/20 1222 Oral     SpO2 06/03/20 1222 95 %     Weight 06/03/20 1223 175 lb 14.8 oz (79.8 kg)     Height 06/03/20 1223 5\' 4"  (1.626 m)     Head Circumference --      Peak Flow --      Pain Score 06/03/20 1223 8     Pain Loc --      Pain Edu? --      Excl. in [place]? --     Constitutional: Alert and oriented. Well appearing and in no distress. Head: Normocephalic and atraumatic. Eyes: Conjunctivae are normal. Normal extraocular movements Cardiovascular: Normal rate, regular rhythm. Normal distal pulses. Respiratory: Normal respiratory effort. No wheezes/rales/rhonchi. Musculoskeletal:  Right knee without obvious deformity, dislocation, or effusion.  Patient with normal active range of motion to the knee.  There is palpable and audible clicking with range of motion.  No valgus or varus or stress is elicited.  No crepitus pes planus is noted.  No calf or Achilles tenderness noted distally.  Nontender with normal range of motion in all extremities.  Neurologic: Cranial nerves II to XII grossly intact.  Normal gait without ataxia. Normal speech and language. No gross focal neurologic deficits are appreciated. Skin:  Skin is warm, dry and intact. No rash noted.  Patient with a single erythematous firm lesion noted to the anterior thigh in the bikini region.  No pustule or fluctuance is appreciated. Psychiatric: Mood and affect are normal. Patient exhibits appropriate insight and judgment. ____________________________________________   RADIOLOGY  DG Right   IMPRESSION: Negative. ____________________________________________  PROCEDURES  Keflex 500 mg PO  Procedures ____________________________________________  INITIAL IMPRESSION / ASSESSMENT AND PLAN / ED COURSE  Patient ED evaluation of 2 separate complaints including chronic knee pain on the right.  She presents for evaluation of her pain but denies any preceding injury or trauma.  She notes some catching and clicking with range of motion to the knee.  X-rays negative for any acute findings.  She also has a lesion to the inner right thigh that is concerning for early cellulitis or infected ingrown hair.  She will started on Keflex empirically.  She will follow with primary provider return to the ED if needed.  Barbara Arellano was evaluated in Emergency Department on 06/03/2020 for the symptoms described in the history of present illness. She was evaluated in the context of the global COVID-19 pandemic, which necessitated consideration that the patient might be at risk for infection with the SARS-CoV-2 virus that causes  COVID-19. Institutional protocols and algorithms that pertain to the evaluation of patients at risk for COVID-19 are in a state of rapid change based on information released by regulatory bodies including the CDC and federal and state organizations. These policies and algorithms were followed during the patient's care in the ED. ____________________________________________  FINAL CLINICAL IMPRESSION(S) / ED DIAGNOSES  Final diagnoses:  Acute pain of right knee  Cellulitis of thigh      Carmie End, Dannielle Karvonen, PA-C 06/03/20 1933    Lucrezia Starch, MD 06/04/20 1030

## 2020-06-03 NOTE — ED Triage Notes (Signed)
Pt to ED via POV with c/o R knee pain, pt denies known injury, states walks a lot for work. Pt states feels like her R knee locks up.

## 2020-06-30 ENCOUNTER — Ambulatory Visit: Payer: Self-pay | Admitting: Obstetrics & Gynecology

## 2020-10-25 ENCOUNTER — Emergency Department
Admission: EM | Admit: 2020-10-25 | Discharge: 2020-10-25 | Disposition: A | Discharge status: Home / Self Care | Payer: Self-pay | Attending: Emergency Medicine | Admitting: Emergency Medicine

## 2020-10-25 ENCOUNTER — Encounter: Payer: Self-pay | Admitting: Emergency Medicine

## 2020-10-25 ENCOUNTER — Emergency Department: Payer: Self-pay

## 2020-10-25 ENCOUNTER — Other Ambulatory Visit: Payer: Self-pay

## 2020-10-25 DIAGNOSIS — Y990 Civilian activity done for income or pay: Secondary | ICD-10-CM | POA: Insufficient documentation

## 2020-10-25 DIAGNOSIS — S60211A Contusion of right wrist, initial encounter: Secondary | ICD-10-CM | POA: Insufficient documentation

## 2020-10-25 DIAGNOSIS — F17210 Nicotine dependence, cigarettes, uncomplicated: Secondary | ICD-10-CM | POA: Insufficient documentation

## 2020-10-25 DIAGNOSIS — S7001XA Contusion of right hip, initial encounter: Secondary | ICD-10-CM | POA: Insufficient documentation

## 2020-10-25 DIAGNOSIS — J069 Acute upper respiratory infection, unspecified: Secondary | ICD-10-CM | POA: Insufficient documentation

## 2020-10-25 DIAGNOSIS — W1830XA Fall on same level, unspecified, initial encounter: Secondary | ICD-10-CM | POA: Insufficient documentation

## 2020-10-25 LAB — URINALYSIS, COMPLETE (UACMP) WITH MICROSCOPIC
Bilirubin Urine: NEGATIVE
Glucose, UA: NEGATIVE mg/dL
Hgb urine dipstick: NEGATIVE
Ketones, ur: 5 mg/dL — AB
Nitrite: NEGATIVE
Protein, ur: NEGATIVE mg/dL
Specific Gravity, Urine: 1.008 (ref 1.005–1.030)
pH: 6 (ref 5.0–8.0)

## 2020-10-25 MED ORDER — NAPROXEN 500 MG PO TABS: 500.0000 mg | ORAL_TABLET | Freq: Two times a day (BID) | ORAL | 0 refills | Status: DC

## 2020-10-25 MED ORDER — PSEUDOEPH-BROMPHEN-DM 30-2-10 MG/5ML PO SYRP: 5.0000 mL | ORAL_SOLUTION | Freq: Four times a day (QID) | ORAL | 0 refills | Status: DC | PRN

## 2020-10-25 NOTE — ED Triage Notes (Signed)
Pt via POV from home. Pt had a fall on Thursday at work, pt states she fell backwards and fell on her bottom. Denies LOC. Denies head injury. Denies blood thinner. Pt c/o R wrist pain and sacral pain. Pt also states she is having a cough and nasal congestion. Pt is A&Ox4 and NAD.

## 2020-10-25 NOTE — ED Provider Notes (Signed)
[facility] Emergency Department Provider Note  ____________________________________________   Event Date/Time   First MD Initiated Contact with Patient 10/25/20 1151     (approximate)  I have reviewed the triage vital signs and the nursing notes.   HISTORY  Chief Complaint Fall   HPI Barbara Arellano is a 45 y.o. female presents to the ED with complaint of a fall that occurred Thursday while at work.  Patient states that she fell backwards and landed on her buttocks mostly on the right side.  She also complains of right wrist injury trying to brace the fall.  She denies any head injury or loss of consciousness.  She also is here with complaint of cough and nasal congestion.  She denies any fever, nausea, vomiting or diarrhea.  There is been no change in taste or smell.  She denies any known exposure to COVID.  Currently smokes 1/2 pack cigarettes per day.  Presently she rates her pain as a 10/10.       Past Medical History:  Diagnosis Date  . Anxiety   . Cervical arthritis   . Depression   . Fibroid, uterine   . Pneumonia     Patient Active Problem List   Diagnosis Date Noted  . Right lower quadrant pain 03/25/2020  . Right ovarian cyst 03/20/2020  . Vaginal bleeding 12/05/2017  . Iron deficiency anemia due to chronic blood loss 12/05/2017    Past Surgical History:  Procedure Laterality Date  . BILATERAL SALPINGECTOMY     age 21  . BREAST BIOPSY Right 12/15/2017   neg  . FRACTURE SURGERY Left    foot  . LAPAROSCOPIC LYSIS OF ADHESIONS Right 03/25/2020   Procedure: Laparoscopy with Right Ovarian Cystectomy;  Surgeon: Gae Dry, MD;  Location: [facility] ORS;  Service: Gynecology;  Laterality: Right;  . OOPHORECTOMY Left 2015   ruptured cyst  . OVARIAN CYST REMOVAL      Prior to Admission medications   Medication Sig Start Date End Date Taking? Authorizing Provider  brompheniramine-pseudoephedrine-DM 30-2-10 MG/5ML syrup Take 5 mLs by  mouth 4 (four) times daily as needed. 10/25/20  Yes Johnn Hai, PA-C  naproxen (NAPROSYN) 500 MG tablet Take 1 tablet (500 mg total) by mouth 2 (two) times daily with a meal. 10/25/20  Yes Johnn Hai, PA-C    Allergies Patient has no known allergies.  Family History  Problem Relation Age of Onset  . Heart disease Mother   . Cancer Father 60       bone marrow  . Heart disease Father   . Breast cancer Maternal Aunt        late 15's    Social History Social History   Tobacco Use  . Smoking status: Current Every Day Smoker    Packs/day: 0.50    Types: Cigarettes  . Smokeless tobacco: Never Used  Vaping Use  . Vaping Use: Never used  Substance Use Topics  . Alcohol use: No  . Drug use: Yes    Types: Marijuana    Comment: occassional    Review of Systems Constitutional: No fever/chills Eyes: No visual changes. ENT: No sore throat.  Positive for nasal congestion. Cardiovascular: Denies chest pain. Respiratory: Denies shortness of breath.  Positive for cough. Gastrointestinal: No abdominal pain.  No nausea, no vomiting.  Genitourinary: Negative for dysuria. Musculoskeletal: Positive for right wrist pain, right hip pain. Skin: Negative for rash.  Abrasions. Neurological: Negative for headaches, negative for focal weakness  or numbness.  ____________________________________________   PHYSICAL EXAM:  VITAL SIGNS: ED Triage Vitals  Enc Vitals Group     BP 10/25/20 1149 133/83     Pulse Rate 10/25/20 1149 77     Resp 10/25/20 1149 16     Temp 10/25/20 1149 98.1 F (36.7 C)     Temp Source 10/25/20 1149 Oral     SpO2 10/25/20 1149 97 %     Weight 10/25/20 1150 156 lb (70.8 kg)     Height 10/25/20 1150 5\' 4"  (1.626 m)     Head Circumference --      Peak Flow --      Pain Score 10/25/20 1149 10     Pain Loc --      Pain Edu? --      Excl. in [place]? --     Constitutional: Alert and oriented. Well appearing and in no acute distress. Eyes: Conjunctivae are  normal. PERRL. EOMI. Head: Atraumatic. Nose: Positive congestion/no rhinnorhea. Mouth/Throat: Mucous membranes are moist.  Oropharynx non-erythematous. Neck: No stridor.  No cervical tenderness on palpation posteriorly. Cardiovascular: Normal rate, regular rhythm. Grossly normal heart sounds.  Good peripheral circulation. Respiratory: Normal respiratory effort.  No retractions. Lungs CTAB. Gastrointestinal: Soft and nontender. No distention.  Musculoskeletal: No tenderness on palpation of thoracic or lumbar spine.  Patient is more tender over the right lateral hip area.  Range of motion is mildly restricted secondary to discomfort.  No abrasions or ecchymosis is noted.  Patient is able to bear weight without any assistance.  Right wrist without edema or discoloration.  Patient is able to flex and extend but guards secondary to discomfort.  Patient is able to move digits distally.  Radial pulse present.  It is intact. Neurologic:  Normal speech and language. No gross focal neurologic deficits are appreciated. No gait instability. Skin:  Skin is warm, dry and intact. No rash noted. Psychiatric: Mood and affect are normal. Speech and behavior are normal.  ____________________________________________   LABS (all labs ordered are listed, but only abnormal results are displayed)  Labs Reviewed  URINALYSIS, COMPLETE (UACMP) WITH MICROSCOPIC - Abnormal; Notable for the following components:      Result Value   Color, Urine YELLOW (*)    APPearance CLEAR (*)    Ketones, ur 5 (*)    Leukocytes,Ua TRACE (*)    Bacteria, UA RARE (*)    All other components within normal limits   ____________________________________________  RADIOLOGY I, Johnn Hai, personally viewed and evaluated these images (plain radiographs) as part of my medical decision making, as well as reviewing the written report by the radiologist.   Official radiology report(s): DG Wrist Complete Right  Result Date:  10/25/2020 CLINICAL DATA:  Right wrist pain after fall 3 days ago EXAM: RIGHT WRIST - COMPLETE 3+ VIEW COMPARISON:  12/17/2019 FINDINGS: There is no evidence of fracture or dislocation. There is no evidence of arthropathy or other focal bone abnormality. Soft tissues are unremarkable. IMPRESSION: Negative. Electronically Signed   By: Davina Poke D.O.   On: 10/25/2020 13:43   DG Hip Unilat W or Wo Pelvis 2-3 Views Right  Result Date: 10/25/2020 CLINICAL DATA:  Right hip pain after fall 3 days ago EXAM: DG HIP (WITH OR WITHOUT PELVIS) 2-3V RIGHT COMPARISON:  05/12/2019 FINDINGS: There is no evidence of hip fracture or dislocation. There is no evidence of arthropathy or other focal bone abnormality. IMPRESSION: Negative. Electronically Signed   By: Davina Poke D.O.  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  She has injuries to her right hip and right wrist from the fall but denies any head injury or loss of consciousness.  X-rays of the wrist and right hip are negative for any acute bony injury.  Patient does have nasal congestion with an upper respiratory infection most likely.  There may be a component of seasonal allergies along with that.  She was given a prescription for Bromfed-DM as needed for cough and congestion.  Naproxen 500 mg twice daily with food.  Patient is to increase fluids.  She is encouraged to use ice to her wrist and hip as needed for discomfort.   She will follow-up with her PCP or [facility] acute care if any continued problems.  ____________________________________________   FINAL CLINICAL IMPRESSION(S) / ED DIAGNOSES  Final diagnoses:  Contusion of right hip, initial encounter  Contusion of right wrist, initial encounter  Acute upper respiratory infection     ED Discharge Orders         Ordered    brompheniramine-pseudoephedrine-DM 30-2-10 MG/5ML syrup  4 times daily PRN        10/25/20 1404    naproxen (NAPROSYN) 500 MG tablet  2 times daily with meals        10/25/20 1404          *Please note:  Barbara Arellano was evaluated in Emergency Department on 10/25/2020 for the symptoms described in the history of present illness. She was evaluated in the context of the global COVID-19 pandemic, which necessitated consideration that the patient might be at risk for infection with the SARS-CoV-2 virus that causes COVID-19. Institutional protocols and algorithms that pertain to the evaluation of patients at risk for COVID-19 are in a state of rapid change based on information released by regulatory bodies including the CDC and federal and state organizations. These policies and algorithms were followed during the patient's care in the ED.  Some ED evaluations and interventions may be delayed as a result of limited staffing during and the pandemic.*   Note:  This document was prepared using Dragon voice recognition software and may include unintentional dictation errors.    Johnn Hai, PA-C 10/25/20 1525    Vladimir Crofts, MD 10/25/20 [phone]

## 2020-10-25 NOTE — Discharge Instructions (Signed)
Follow-up with [facility] acute care if any continued problems.  A prescription for Bromfed-DM was sent to your pharmacy as needed for nasal congestion and cough.  You may take Tylenol with this medication if additional medication is needed.  Naproxen is twice a day with food for your right wrist and hip contusion.  You may apply ice to your wrist and hip as needed for discomfort.  Increase fluids for your nasal congestion and also saline nasal spray may also help with your nasal congestion.  Because you have already taken a COVID test and is negative, you have no fever and also your oxygen level is 97%.  If you begin running a high fever or not feeling well you should go to a COVID testing site and be tested again.

## 2020-11-19 ENCOUNTER — Other Ambulatory Visit: Payer: Self-pay

## 2020-11-19 ENCOUNTER — Emergency Department
Admission: EM | Admit: 2020-11-19 | Discharge: 2020-11-19 | Disposition: A | Discharge status: Home / Self Care | Payer: Self-pay | Attending: Emergency Medicine | Admitting: Emergency Medicine

## 2020-11-19 ENCOUNTER — Emergency Department: Payer: Self-pay

## 2020-11-19 DIAGNOSIS — F17210 Nicotine dependence, cigarettes, uncomplicated: Secondary | ICD-10-CM | POA: Insufficient documentation

## 2020-11-19 DIAGNOSIS — S60211A Contusion of right wrist, initial encounter: Secondary | ICD-10-CM | POA: Insufficient documentation

## 2020-11-19 DIAGNOSIS — W228XXA Striking against or struck by other objects, initial encounter: Secondary | ICD-10-CM | POA: Insufficient documentation

## 2020-11-19 MED ORDER — ACETAMINOPHEN 500 MG PO TABS
1000.0000 mg | ORAL_TABLET | Freq: Once | ORAL | Status: AC
  Administered 2020-11-19: 1000 mg via ORAL
  Filled 2020-11-19: qty 2

## 2020-11-19 NOTE — ED Triage Notes (Signed)
Pt comes with c/o right wrist pain. Pt states she hit the counter with it.
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
## 2020-11-19 NOTE — Discharge Instructions (Addendum)
Follow-up with your primary care provider if any continued problems.  Ice and elevation to reduce swelling and help with pain.  You may take Tylenol or an anti-inflammatory such as ibuprofen or naproxen for pain.  Wear the Ace wrap for support and protection.

## 2021-01-15 ENCOUNTER — Other Ambulatory Visit: Payer: Self-pay

## 2021-01-15 ENCOUNTER — Encounter: Payer: Self-pay | Admitting: Emergency Medicine

## 2021-01-15 ENCOUNTER — Emergency Department: Payer: Self-pay

## 2021-01-15 ENCOUNTER — Emergency Department
Admission: EM | Admit: 2021-01-15 | Discharge: 2021-01-15 | Disposition: A | Discharge status: Home / Self Care | Payer: Self-pay | Attending: Emergency Medicine | Admitting: Emergency Medicine

## 2021-01-15 DIAGNOSIS — F17210 Nicotine dependence, cigarettes, uncomplicated: Secondary | ICD-10-CM | POA: Insufficient documentation

## 2021-01-15 DIAGNOSIS — M5413 Radiculopathy, cervicothoracic region: Secondary | ICD-10-CM | POA: Insufficient documentation

## 2021-01-15 MED ORDER — PREDNISONE 10 MG (21) PO TBPK: ORAL_TABLET | ORAL | 0 refills | Status: DC

## 2021-01-15 MED ORDER — HYDROCODONE-ACETAMINOPHEN 5-325 MG PO TABS: 1.0000 | ORAL_TABLET | Freq: Four times a day (QID) | ORAL | 0 refills | Status: AC | PRN

## 2021-01-15 MED ORDER — TIZANIDINE HCL 4 MG PO TABS: 4.0000 mg | ORAL_TABLET | Freq: Three times a day (TID) | ORAL | 0 refills | Status: DC

## 2021-01-15 NOTE — ED Notes (Signed)
See triage note  Presents with left arm numbness  States sxs; started about 5 days ago  Numbness states at shoulder and moves into arm  Denies any injury   Good pulses

## 2021-01-15 NOTE — ED Triage Notes (Signed)
Pt comes into the ED via POV c/o left arm/shoulder pain and numbness in the left hand x 5 days.  Pt denies any known injury to the arm. Pt has no obvious deformity to the arm at this time.  Pt states the pain starts in her shoulder and radiates through the trap, shoulder, and down the arm. Pt still has full movement in the arm.

## 2021-01-15 NOTE — Discharge Instructions (Addendum)
Please follow up with primary care if not improving over the week.  Return to the ER for symptoms that change or worsen if unable to schedule an appointment.

## 2021-01-19 NOTE — ED Provider Notes (Signed)
[facility] Emergency Department Provider Note ____________________________________________  Time seen: Approximately 4:28 PM  I have reviewed the triage vital signs and the nursing notes.   HISTORY  Chief Complaint Numbness and Shoulder Pain    HPI Barbara Arellano is a 45 y.o. female who presents to the emergency department for evaluation and treatment of left arm and shoulder pain that radiates into the left hand x 5 days. No specific injury.    Past Medical History:  Diagnosis Date   Anxiety    Cervical arthritis    Depression    Fibroid, uterine    Pneumonia     Patient Active Problem List   Diagnosis Date Noted   Right lower quadrant pain 03/25/2020   Right ovarian cyst 03/20/2020   Vaginal bleeding 12/05/2017   Iron deficiency anemia due to chronic blood loss 12/05/2017    Past Surgical History:  Procedure Laterality Date   BILATERAL SALPINGECTOMY     age 7   BREAST BIOPSY Right 12/15/2017   neg   FRACTURE SURGERY Left    foot   LAPAROSCOPIC LYSIS OF ADHESIONS Right 03/25/2020   Procedure: Laparoscopy with Right Ovarian Cystectomy;  Surgeon: Gae Dry, MD;  Location: [facility] ORS;  Service: Gynecology;  Laterality: Right;   OOPHORECTOMY Left 2015   ruptured cyst   OVARIAN CYST REMOVAL      Prior to Admission medications   Medication Sig Start Date End Date Taking? Authorizing Provider  predniSONE (STERAPRED UNI-PAK 21 TAB) 10 MG (21) TBPK tablet Take 6 tablets on the first day and decrease by 1 tablet each day until finished. 01/15/21  Yes Hamp Moreland B, FNP  tiZANidine (ZANAFLEX) 4 MG tablet Take 1 tablet (4 mg total) by mouth 3 (three) times daily. 01/15/21  Yes Keyler Hoge, Dessa Phi, FNP    Allergies Patient has no known allergies.  Family History  Problem Relation Age of Onset   Heart disease Mother    Cancer Father 109       bone marrow   Heart disease Father    Breast cancer Maternal Aunt        late 44's    Social  History Social History   Tobacco Use   Smoking status: Every Day    Packs/day: 0.50    Types: Cigarettes   Smokeless tobacco: Never  Vaping Use   Vaping Use: Never used  Substance Use Topics   Alcohol use: No   Drug use: Yes    Types: Marijuana    Comment: occassional    Review of Systems Constitutional: Negative for fever. Cardiovascular: Negative for chest pain. Respiratory: Negative for shortness of breath. Musculoskeletal: Positive for left upper extremity pain. Skin: No open wounds or lesions, no rash.  Neurological: Negative for decrease in sensation  ____________________________________________   PHYSICAL EXAM:  VITAL SIGNS: ED Triage Vitals  Enc Vitals Group     BP 01/15/21 1642 124/81     Pulse Rate 01/15/21 1642 89     Resp 01/15/21 1642 17     Temp 01/15/21 1642 98.4 F (36.9 C)     Temp Source 01/15/21 1642 Oral     SpO2 01/15/21 1642 97 %     Weight 01/15/21 1643 158 lb (71.7 kg)     Height 01/15/21 1643 5\' 4"  (1.626 m)     Head Circumference --      Peak Flow --      Pain Score 01/15/21 1643 10  Pain Loc --      Pain Edu? --      Excl. in [place]? --     Constitutional: Alert and oriented. Well appearing and in no acute distress. Eyes: Conjunctivae are clear without discharge or drainage Head: Atraumatic Neck: Supple. No focal tenderness. Respiratory: No cough. Respirations are even and unlabored. Musculoskeletal: Pain in left arm/shoulder with radiation into all fingers/hand. Neurologic: Motor and sensory function is intact. Grip strength equal.  Skin: No rash or open wounds over the left upper extremity.  Psychiatric: Affect and behavior are appropriate.  ____________________________________________   LABS (all labs ordered are listed, but only abnormal results are displayed)  Labs Reviewed - No data to display ____________________________________________  RADIOLOGY  Image of the left shoulder negative for acute concerns.  I, Sherrie George, personally viewed and evaluated these images (plain radiographs) as part of my medical decision making, as well as reviewing the written report by the radiologist.  No results found. ____________________________________________   PROCEDURES  Procedures  ____________________________________________   INITIAL IMPRESSION / ASSESSMENT AND PLAN / ED COURSE  Barbara Arellano is a 45 y.o. who presents to the emergency department for treatment and evaluation of left upper extremity pain. See HPI and assessment.   Image of the shoulder ordered while in triage shows no abnormality. Suspect symptoms are from repetitive use.  She will be treated with prednisone, Zanaflex, and Norco  Patient instructed to follow-up with orthopedics if not improving over the week.  She was also instructed to return to the emergency department for symptoms that change or worsen if unable schedule an appointment with orthopedics or primary care.  Medications - No data to display  Pertinent labs & imaging results that were available during my care of the patient were reviewed by me and considered in my medical decision making (see chart for details).   _________________________________________   FINAL CLINICAL IMPRESSION(S) / ED DIAGNOSES  Final diagnoses:  Radiculopathy of cervicothoracic region    ED Discharge Orders          Ordered    predniSONE (STERAPRED UNI-PAK 21 TAB) 10 MG (21) TBPK tablet        01/15/21 1900    tiZANidine (ZANAFLEX) 4 MG tablet  3 times daily        01/15/21 1900    HYDROcodone-acetaminophen (NORCO/VICODIN) 5-325 MG tablet  Every 6 hours PRN        01/15/21 1900             If controlled substance prescribed during this visit, 12 month history viewed on the [place] prior to issuing an initial prescription for Schedule II or III opiod.    Victorino Dike, FNP 01/19/21 1635    Blake Divine, MD 01/22/21 2138

## 2021-03-26 ENCOUNTER — Emergency Department: Payer: Self-pay

## 2021-03-26 ENCOUNTER — Other Ambulatory Visit: Payer: Self-pay

## 2021-03-26 ENCOUNTER — Emergency Department
Admission: EM | Admit: 2021-03-26 | Discharge: 2021-03-26 | Disposition: A | Discharge status: Home / Self Care | Payer: Self-pay | Attending: Emergency Medicine | Admitting: Emergency Medicine

## 2021-03-26 ENCOUNTER — Encounter: Payer: Self-pay | Admitting: Intensive Care

## 2021-03-26 DIAGNOSIS — R102 Pelvic and perineal pain: Secondary | ICD-10-CM

## 2021-03-26 DIAGNOSIS — F17210 Nicotine dependence, cigarettes, uncomplicated: Secondary | ICD-10-CM | POA: Insufficient documentation

## 2021-03-26 DIAGNOSIS — N898 Other specified noninflammatory disorders of vagina: Secondary | ICD-10-CM | POA: Insufficient documentation

## 2021-03-26 DIAGNOSIS — R1030 Lower abdominal pain, unspecified: Secondary | ICD-10-CM | POA: Insufficient documentation

## 2021-03-26 LAB — CBC WITH DIFFERENTIAL/PLATELET
Abs Immature Granulocytes: 0.01 10*3/uL (ref 0.00–0.07)
Basophils Absolute: 0.1 10*3/uL (ref 0.0–0.1)
Basophils Relative: 1 %
Eosinophils Absolute: 0.5 10*3/uL (ref 0.0–0.5)
Eosinophils Relative: 6 %
HCT: 39.6 % (ref 36.0–46.0)
Hemoglobin: 14.3 g/dL (ref 12.0–15.0)
Immature Granulocytes: 0 %
Lymphocytes Relative: 35 %
Lymphs Abs: 3 10*3/uL (ref 0.7–4.0)
MCH: 34 pg (ref 26.0–34.0)
MCHC: 36.1 g/dL — ABNORMAL HIGH (ref 30.0–36.0)
MCV: 94.3 fL (ref 80.0–100.0)
Monocytes Absolute: 0.5 10*3/uL (ref 0.1–1.0)
Monocytes Relative: 6 %
Neutro Abs: 4.3 10*3/uL (ref 1.7–7.7)
Neutrophils Relative %: 52 %
Platelets: 222 10*3/uL (ref 150–400)
RBC: 4.2 MIL/uL (ref 3.87–5.11)
RDW: 11.6 % (ref 11.5–15.5)
WBC: 8.4 10*3/uL (ref 4.0–10.5)
nRBC: 0 % (ref 0.0–0.2)

## 2021-03-26 LAB — COMPREHENSIVE METABOLIC PANEL
ALT: 27 U/L (ref 0–44)
AST: 30 U/L (ref 15–41)
Albumin: 4.1 g/dL (ref 3.5–5.0)
Alkaline Phosphatase: 101 U/L (ref 38–126)
Anion gap: 8 (ref 5–15)
BUN: 14 mg/dL (ref 6–20)
CO2: 24 mmol/L (ref 22–32)
Calcium: 9.4 mg/dL (ref 8.9–10.3)
Chloride: 106 mmol/L (ref 98–111)
Creatinine, Ser: 0.5 mg/dL (ref 0.44–1.00)
GFR, Estimated: >60 mL/min (ref >60)
Glucose, Bld: 90 mg/dL (ref 70–99)
Potassium: 4.1 mmol/L (ref 3.5–5.1)
Sodium: 138 mmol/L (ref 135–145)
Total Bilirubin: 0.8 mg/dL (ref 0.3–1.2)
Total Protein: 7.8 g/dL (ref 6.5–8.1)

## 2021-03-26 LAB — PREGNANCY, URINE: Preg Test, Ur: NEGATIVE

## 2021-03-26 LAB — LACTIC ACID, PLASMA: Lactic Acid, Venous: 1 mmol/L (ref 0.5–1.9)

## 2021-03-26 LAB — WET PREP, GENITAL
Clue Cells Wet Prep HPF POC: NONE SEEN
Sperm: NONE SEEN
Trich, Wet Prep: NONE SEEN
Yeast Wet Prep HPF POC: NONE SEEN

## 2021-03-26 LAB — URINALYSIS, COMPLETE (UACMP) WITH MICROSCOPIC
Bilirubin Urine: NEGATIVE
Glucose, UA: NEGATIVE mg/dL
Hgb urine dipstick: NEGATIVE
Ketones, ur: NEGATIVE mg/dL
Leukocytes,Ua: NEGATIVE
Nitrite: NEGATIVE
Protein, ur: NEGATIVE mg/dL
Specific Gravity, Urine: 1.019 (ref 1.005–1.030)
pH: 5 (ref 5.0–8.0)

## 2021-03-26 LAB — CHLAMYDIA/NGC RT PCR (ARMC ONLY)
Chlamydia Tr: NOT DETECTED
N gonorrhoeae: NOT DETECTED

## 2021-03-26 MED ORDER — OXYCODONE-ACETAMINOPHEN 5-325 MG PO TABS
1.0000 | ORAL_TABLET | Freq: Once | ORAL | Status: AC
  Administered 2021-03-26: 1 via ORAL
  Filled 2021-03-26: qty 1

## 2021-03-26 NOTE — ED Triage Notes (Signed)
Patient c/o lower abdominal pain X1 week. Reports having past cyst removed a year ago.

## 2021-03-26 NOTE — ED Provider Notes (Signed)
[facility] Emergency Department Provider Note  Time seen: 1:21 PM  I have reviewed the triage vital signs and the nursing notes.   HISTORY  Chief Complaint Abdominal Pain   HPI Barbara Arellano is a 45 y.o. female with a past medical history of anxiety, ovarian cysts, presents to the emergency department for lower abdominal discomfort.  According to the patient over the past few days she has been experiencing lower abdominal discomfort consistent with her prior ovarian cyst.  Patient denies any nausea or vomiting has had several episodes of loose stool.  No fever.  No dysuria or hematuria.  Patient does state a slight vaginal discharge.  No vaginal bleeding.  Patient states she presented to the emergency department because she wanted to see if she had another ovarian cyst as this discomfort is very similar to the pain she had approximately 1 year ago when she required a cystectomy for an ovarian cyst.   Past Medical History:  Diagnosis Date   Anxiety    Cervical arthritis    Depression    Fibroid, uterine    Pneumonia     Patient Active Problem List   Diagnosis Date Noted   Right lower quadrant pain 03/25/2020   Right ovarian cyst 03/20/2020   Vaginal bleeding 12/05/2017   Iron deficiency anemia due to chronic blood loss 12/05/2017    Past Surgical History:  Procedure Laterality Date   BILATERAL SALPINGECTOMY     age 73   BREAST BIOPSY Right 12/15/2017   neg   FRACTURE SURGERY Left    foot   LAPAROSCOPIC LYSIS OF ADHESIONS Right 03/25/2020   Procedure: Laparoscopy with Right Ovarian Cystectomy;  Surgeon: Gae Dry, MD;  Location: [facility] ORS;  Service: Gynecology;  Laterality: Right;   OOPHORECTOMY Left 2015   ruptured cyst   OVARIAN CYST REMOVAL      Prior to Admission medications   Not on File    No Known Allergies  Family History  Problem Relation Age of Onset   Heart disease Mother    Cancer Father 71       bone marrow   Heart  disease Father    Breast cancer Maternal Aunt        late 58's    Social History Social History   Tobacco Use   Smoking status: Every Day    Packs/day: 0.50    Types: Cigarettes   Smokeless tobacco: Never  Vaping Use   Vaping Use: Never used  Substance Use Topics   Alcohol use: No   Drug use: Yes    Types: Marijuana    Comment: occassional    Review of Systems Constitutional: Negative for fever. Cardiovascular: Negative for chest pain. Respiratory: Negative for shortness of breath. Gastrointestinal: Lower abdominal pain, mild dull pain.  Negative for nausea or vomiting.  Mild loose stool. Genitourinary: Negative for urinary compaints.  Slight vaginal discharge. Musculoskeletal: Negative for musculoskeletal complaints Skin: Negative for skin complaints  Neurological: Negative for headache All other ROS negative  ____________________________________________   PHYSICAL EXAM:  VITAL SIGNS: ED Triage Vitals  Enc Vitals Group     BP 03/26/21 1139 (!) 139/91     Pulse Rate 03/26/21 1139 76     Resp 03/26/21 1140 18     Temp 03/26/21 1139 98 F (36.7 C)     Temp Source 03/26/21 1139 Oral     SpO2 03/26/21 1139 98 %     Weight 03/26/21 1131 158 lb (71.7  kg)     Height 03/26/21 1131 5\' 4"  (1.626 m)     Head Circumference --      Peak Flow --      Pain Score 03/26/21 1131 10     Pain Loc --      Pain Edu? --      Excl. in [place]? --    Constitutional: Alert and oriented. Well appearing and in no distress. Eyes: Normal exam ENT      Head: Normocephalic and atraumatic.      Mouth/Throat: Mucous membranes are moist. Cardiovascular: Normal rate, regular rhythm. Respiratory: Normal respiratory effort without tachypnea nor retractions. Breath sounds are clear  Gastrointestinal: Soft, mild suprapubic tenderness palpation without rebound guarding or distention.  Otherwise benign abdomen. Musculoskeletal: Nontender with normal range of motion in all extremities. Neurologic:   Normal speech and language. No gross focal neurologic deficits Skin:  Skin is warm, dry and intact.  Psychiatric: Mood and affect are normal.   ____________________________________________   RADIOLOGY  Ultrasound shows no acute abnormality.  There is a right ovarian cyst measuring 2.7 cm.  ____________________________________________   INITIAL IMPRESSION / ASSESSMENT AND PLAN / ED COURSE  Pertinent labs & imaging results that were available during my care of the patient were reviewed by me and considered in my medical decision making (see chart for details).   Patient presents emergency department for lower abdominal discomfort similar to when she had ovarian cysts in the past.  Patient's blood work and urinalysis are largely within normal limits.  We will obtain an ultrasound of the pelvis to further evaluate.  Given the patient's slight vaginal discharge we will also obtain a wet prep/GC chlamydia.  Overall patient appears well.  No significant distress.  Wet prep is negative.  Right ovarian cyst on ultrasound but no acute abnormalities.  No concerning findings.  Given the patient's reassuring work-up we will discharge home with PCP follow-up.  Discussed my typical abdominal pain return precautions.  Patient agreeable to plan of care.  [name] was evaluated in Emergency Department on 03/26/2021 for the symptoms described in the history of present illness. She was evaluated in the context of the global COVID-19 pandemic, which necessitated consideration that the patient might be at risk for infection with the SARS-CoV-2 virus that causes COVID-19. Institutional protocols and algorithms that pertain to the evaluation of patients at risk for COVID-19 are in a state of rapid change based on information released by regulatory bodies including the CDC and federal and state organizations. These policies and algorithms were followed during the patient's care in the  ED.  ____________________________________________   FINAL CLINICAL IMPRESSION(S) / ED DIAGNOSES  Lower abdominal discomfort    Harvest Dark, MD 03/26/21 1507

## 2021-03-26 NOTE — ED Provider Notes (Signed)
Emergency Medicine Provider Triage Evaluation Note  Barbara Arellano , a 45 y.o. female  was evaluated in triage.  Pt complains of left lower quadrant/pelvic pain.  Patient has a history of bilateral ovarian cyst.  She is having oophorectomy on the left side due to torsion.  Patient states that the pain feels the same as her previous torsion.  No menstrual cycle since last year.  No fevers or chills.  No constipation or diarrhea.  No urinary symptoms.  No vaginal bleeding or discharge..  Review of Systems  Positive: Left lower quadrant pain/pelvic pain Negative: No urinary changes, hematuria, constipation, diarrhea, fevers or chills  Physical Exam  BP (!) 139/91 (BP Location: Left Arm)   Pulse 76   Temp 98 F (36.7 C) (Oral)   Resp 18   Ht 5\' 4"  (1.626 m)   Wt 71.7 kg   SpO2 98%   BMI 27.12 kg/m  Gen:   Awake, no distress , patient doubled over holding her abdomen Resp:  Normal effort  MSK:   Moves extremities without difficulty  Other:  Bowel sounds x4 quadrants, very tender with guarding left lower quadrant/left suprapubic region.  Medical Decision Making  Medically screening exam initiated at 11:48 AM.  Appropriate orders placed.  Barbara Arellano was informed that the remainder of the evaluation will be completed by another provider, this initial triage assessment does not replace that evaluation, and the importance of remaining in the ED until their evaluation is complete.  Patient presented with sharp left lower quadrant pain.  History of ovarian cysts and previous torsion in the past.  Patient has had an oophorectomy on the left side due to previous torsion.  Sudden sharp onset of left lower quadrant/pelvic pain.  No urinary or GI symptoms.  No vaginal bleeding or discharge.  Patient will have labs, imaging performed at this time.   Brynda Peon 03/26/21 1148    Blake Divine, MD 03/26/21 1254

## 2021-06-05 ENCOUNTER — Emergency Department
Admission: EM | Admit: 2021-06-05 | Discharge: 2021-06-05 | Disposition: A | Discharge status: Home / Self Care | Payer: Self-pay | Attending: Emergency Medicine | Admitting: Emergency Medicine

## 2021-06-05 ENCOUNTER — Other Ambulatory Visit: Payer: Self-pay

## 2021-06-05 ENCOUNTER — Encounter: Payer: Self-pay | Admitting: Emergency Medicine

## 2021-06-05 ENCOUNTER — Emergency Department: Payer: Self-pay

## 2021-06-05 DIAGNOSIS — R102 Pelvic and perineal pain: Secondary | ICD-10-CM | POA: Insufficient documentation

## 2021-06-05 DIAGNOSIS — F17210 Nicotine dependence, cigarettes, uncomplicated: Secondary | ICD-10-CM | POA: Insufficient documentation

## 2021-06-05 DIAGNOSIS — J101 Influenza due to other identified influenza virus with other respiratory manifestations: Secondary | ICD-10-CM | POA: Insufficient documentation

## 2021-06-05 DIAGNOSIS — Z20822 Contact with and (suspected) exposure to covid-19: Secondary | ICD-10-CM | POA: Insufficient documentation

## 2021-06-05 DIAGNOSIS — R1031 Right lower quadrant pain: Secondary | ICD-10-CM | POA: Insufficient documentation

## 2021-06-05 LAB — BASIC METABOLIC PANEL
Anion gap: 7 (ref 5–15)
BUN: 13 mg/dL (ref 6–20)
CO2: 26 mmol/L (ref 22–32)
Calcium: 9.2 mg/dL (ref 8.9–10.3)
Chloride: 104 mmol/L (ref 98–111)
Creatinine, Ser: 0.49 mg/dL (ref 0.44–1.00)
GFR, Estimated: >60 mL/min (ref >60)
Glucose, Bld: 91 mg/dL (ref 70–99)
Potassium: 3.4 mmol/L — ABNORMAL LOW (ref 3.5–5.1)
Sodium: 137 mmol/L (ref 135–145)

## 2021-06-05 LAB — RESP PANEL BY RT-PCR (FLU A&B, COVID) ARPGX2
Influenza A by PCR: POSITIVE — AB
Influenza B by PCR: NEGATIVE
SARS Coronavirus 2 by RT PCR: NEGATIVE

## 2021-06-05 LAB — CBC
HCT: 38.4 % (ref 36.0–46.0)
Hemoglobin: 13.5 g/dL (ref 12.0–15.0)
MCH: 33.1 pg (ref 26.0–34.0)
MCHC: 35.2 g/dL (ref 30.0–36.0)
MCV: 94.1 fL (ref 80.0–100.0)
Platelets: 201 10*3/uL (ref 150–400)
RBC: 4.08 MIL/uL (ref 3.87–5.11)
RDW: 11.7 % (ref 11.5–15.5)
WBC: 4.4 10*3/uL (ref 4.0–10.5)
nRBC: 0 % (ref 0.0–0.2)

## 2021-06-05 LAB — TROPONIN I (HIGH SENSITIVITY): Troponin I (High Sensitivity): 3 ng/L (ref <18)

## 2021-06-05 LAB — POC URINE PREG, ED: Preg Test, Ur: NEGATIVE

## 2021-06-05 MED ORDER — KETOROLAC TROMETHAMINE 30 MG/ML IJ SOLN
30.0000 mg | Freq: Once | INTRAMUSCULAR | Status: AC
  Administered 2021-06-05: 30 mg via INTRAMUSCULAR
  Filled 2021-06-05: qty 1

## 2021-06-05 MED ORDER — ORPHENADRINE CITRATE ER 100 MG PO TB12: 100.0000 mg | ORAL_TABLET | Freq: Two times a day (BID) | ORAL | 0 refills | Status: DC

## 2021-06-05 MED ORDER — KETOROLAC TROMETHAMINE 10 MG PO TABS: 10.0000 mg | ORAL_TABLET | Freq: Four times a day (QID) | ORAL | 0 refills | Status: DC | PRN

## 2021-06-05 MED ORDER — ORPHENADRINE CITRATE 30 MG/ML IJ SOLN
60.0000 mg | Freq: Two times a day (BID) | INTRAMUSCULAR | Status: DC
  Administered 2021-06-05: 60 mg via INTRAMUSCULAR
  Filled 2021-06-05: qty 2

## 2021-06-05 NOTE — ED Provider Notes (Signed)
ED ECG REPORT I, Delman Kitten, the attending physician, personally viewed and interpreted this ECG.  Date: 06/05/2021 EKG Time: 1210 Rate: 90 Rhythm: normal sinus rhythm QRS Axis: normal Intervals: normal ST/T Wave abnormalities: normal Narrative Interpretation: no evidence of acute ischemia    Delman Kitten, MD 06/05/21 1604

## 2021-06-05 NOTE — ED Triage Notes (Signed)
Pt here with c/o left sided cp that began about 3 days ago, states she developed a cough over night, low grade fever after work yesterday, has 3 negative covid tests. Pt states she feels weak and tired. Also c/o RLQ ovarian pain, had left ovary removed last year due to cysts, states this pain feels the same, tearful in triage. NAD.

## 2021-06-05 NOTE — ED Provider Notes (Signed)
[facility] Emergency Department Provider Note   ____________________________________________   Event Date/Time   First MD Initiated Contact with Patient 06/05/21 1559     (approximate)  I have reviewed the triage vital signs and the nursing notes.   HISTORY  Chief Complaint Chest Pain and Abdominal Pain    HPI Barbara Arellano is a 45 y.o. female patient complain of right lower abdominal pain.  Patient was diagnosed with ovarian cysts 6 weeks ago.  Abdominal/pelvic pain is similar in nature to to her last visit for ovarian cyst.  Patient also complain of slight chest pain began about 3 days ago's status post coughing spells.  Patient states she  low-grade fever but has had 3 negative COVID test in the past week.  Patient states abdominal pain is similar to when she was presented in September 2022 and was diagnosed with ovarian cyst.  Patient states she was told to follow-up in 6 weeks but due to lack of insurance she did not contact GYN clinic listed in her discharge care instruction.         Past Medical History:  Diagnosis Date   Anxiety    Cervical arthritis    Depression    Fibroid, uterine    Pneumonia     Patient Active Problem List   Diagnosis Date Noted   Right lower quadrant pain 03/25/2020   Right ovarian cyst 03/20/2020   Vaginal bleeding 12/05/2017   Iron deficiency anemia due to chronic blood loss 12/05/2017    Past Surgical History:  Procedure Laterality Date   BILATERAL SALPINGECTOMY     age 19   BREAST BIOPSY Right 12/15/2017   neg   FRACTURE SURGERY Left    foot   LAPAROSCOPIC LYSIS OF ADHESIONS Right 03/25/2020   Procedure: Laparoscopy with Right Ovarian Cystectomy;  Surgeon: Gae Dry, MD;  Location: [facility] ORS;  Service: Gynecology;  Laterality: Right;   OOPHORECTOMY Left 2015   ruptured cyst   OVARIAN CYST REMOVAL      Prior to Admission medications   Medication Sig Start Date End Date Taking? Authorizing  Provider  ketorolac (TORADOL) 10 MG tablet Take 1 tablet (10 mg total) by mouth every 6 (six) hours as needed. 06/05/21  Yes Sable Feil, PA-C  orphenadrine (NORFLEX) 100 MG tablet Take 1 tablet (100 mg total) by mouth 2 (two) times daily. 06/05/21  Yes Sable Feil, PA-C    Allergies Patient has no known allergies.  Family History  Problem Relation Age of Onset   Heart disease Mother    Cancer Father 62       bone marrow   Heart disease Father    Breast cancer Maternal Aunt        late 15's    Social History Social History   Tobacco Use   Smoking status: Every Day    Packs/day: 0.50    Types: Cigarettes   Smokeless tobacco: Never  Vaping Use   Vaping Use: Never used  Substance Use Topics   Alcohol use: No   Drug use: Yes    Types: Marijuana    Comment: occassional    Review of Systems Constitutional: No fever/chills Eyes: No visual changes. ENT: No sore throat. Cardiovascular: Denies chest pain. Respiratory: Denies shortness of breath. Gastrointestinal: Right lower abdominal pain.  No nausea, no vomiting.  No diarrhea.  No constipation. Genitourinary: Negative for dysuria.  Right ovarian cyst Musculoskeletal: Negative for back pain. Skin: Negative for rash. Neurological:  Negative for headaches, focal weakness or numbness. Psychiatric: Anxiety and depression.   ____________________________________________   PHYSICAL EXAM:  VITAL SIGNS: ED Triage Vitals [06/05/21 1216]  Enc Vitals Group     BP 134/90     Pulse Rate (!) 107     Resp (!) 22     Temp 99.5 F (37.5 C)     Temp Source Oral     SpO2 98 %     Weight 156 lb (70.8 kg)     Height 5\' 4"  (1.626 m)     Head Circumference      Peak Flow      Pain Score 10     Pain Loc      Pain Edu?      Excl. in Center?    Constitutional: Alert and oriented.  Moderate distress. Hematological/Lymphatic/Immunilogical: No cervical lymphadenopathy. Cardiovascular: Normal rate, regular rhythm. Grossly normal  heart sounds.  Good peripheral circulation. Respiratory: Normal respiratory effort.  No retractions. Lungs CTAB. Gastrointestinal: Soft and and guarding with palpation right lower abdomen.   No distention. No abdominal bruits. No CVA tenderness. Genitourinary: Deferred Musculoskeletal: No lower extremity tenderness nor edema.  No joint effusions. Neurologic:  Normal speech and language. No gross focal neurologic deficits are appreciated. No gait instability. Skin:  Skin is warm, dry and intact. No rash noted. Psychiatric: Mood and affect are normal. Speech and behavior are normal.  ____________________________________________   LABS (all labs ordered are listed, but only abnormal results are displayed)  Labs Reviewed  BASIC METABOLIC PANEL - Abnormal; Notable for the following components:      Result Value   Potassium 3.4 (*)    All other components within normal limits  RESP PANEL BY RT-PCR (FLU A&B, COVID) ARPGX2  CBC  POC URINE PREG, ED  TROPONIN I (HIGH SENSITIVITY)  TROPONIN I (HIGH SENSITIVITY)   ____________________________________________  EKG  Read by heart station Dr. With no acute findings ____________________________________________  RADIOLOGY I, Sable Feil, personally viewed and evaluated these images (plain radiographs) as part of my medical decision making, as well as reviewing the written report by the radiologist.  ED MD interpretation: No acute findings on chest x-ray  Official radiology report(s): DG Chest 2 View  Result Date: 06/05/2021 CLINICAL DATA:  Chest pain EXAM: CHEST - 2 VIEW COMPARISON:  06/22/2018 FINDINGS: The heart size and mediastinal contours are within normal limits. Both lungs are clear. The visualized skeletal structures are unremarkable. IMPRESSION: No acute abnormality of the lungs. Electronically Signed   By: Delanna Ahmadi M.D.   On: 06/05/2021 12:53   US PELVIC COMPLETE WITH TRANSVAGINAL  Result Date: 06/05/2021 CLINICAL DATA:   Pelvic pain EXAM: TRANSABDOMINAL AND TRANSVAGINAL ULTRASOUND OF PELVIS DOPPLER ULTRASOUND OF OVARIES TECHNIQUE: Both transabdominal and transvaginal ultrasound examinations of the pelvis were performed. Transabdominal technique was performed for global imaging of the pelvis including uterus, ovaries, adnexal regions, and pelvic cul-de-sac. It was necessary to proceed with endovaginal exam following the transabdominal exam to visualize the ovaries. Color and duplex Doppler ultrasound was utilized to evaluate blood flow to the ovaries. COMPARISON:  03/26/2021 FINDINGS: Uterus Measurements: 8 x 3.3 x 4.4 cm = volume: 61.4 mL. There is inhomogeneous echogenicity in myometrium. There is 1.1 x 1.2 x 0.8 cm fibroid in the fundus. There is 0.6 x 0.5 x 0.5 cm fibroid in the right posterior body. Endometrium Thickness: 3.2.  No focal abnormality visualized. Right ovary Measurements: 3.7 x 1.4 x 2.4 cm = volume: 6.4 mL.  There are 2 smooth marginated anechoic structures in the right adnexa larger 1 measuring 1.8 x 1.9 cm. No definite thick internal septations are seen. There is vascular flow in right adnexa in color Doppler examination. Left ovary Not seen consistent with history of left oophorectomy. Other findings No abnormal free fluid. IMPRESSION: There is inhomogeneous echogenicity in myometrium with small fibroids. There is no thickening of endometrial stripe. There is no free fluid in the pelvis. There are 2 small anechoic structures in the right ovary each measuring less than 1.9 cm, possibly follicles or functional cysts. There is vascular flow in the right adnexa. Status post left oophorectomy. Electronically Signed   By: Elmer Picker M.D.   On: 06/05/2021 18:28    ____________________________________________   PROCEDURES  Procedure(s) performed (including Critical Care):  Procedures   ____________________________________________   INITIAL IMPRESSION / ASSESSMENT AND PLAN / ED COURSE  As part  of my medical decision making, I reviewed the following data within the [place]       Patient presents with 2 to 3 days of right lower abdominal pain.  Patient was seen approximate 6 weeks ago and diagnosed with right ovarian cyst.  Repeat x-ray today showed that the cyst is decreasing in size from previous exam.  Patient responded well to IM Toradol and Norflex.  Patient requests no narcotic pain medication .  Advised to establish care with GYN doctor.  Take medicine as directed.  Patient given a work note for 2 days.       ____________________________________________   FINAL CLINICAL IMPRESSION(S) / ED DIAGNOSES  Final diagnoses:  Pelvic pain     ED Discharge Orders          Ordered    orphenadrine (NORFLEX) 100 MG tablet  2 times daily        06/05/21 1835    ketorolac (TORADOL) 10 MG tablet  Every 6 hours PRN        06/05/21 1835             Note:  This document was prepared using Dragon voice recognition software and may include unintentional dictation errors.    Sable Feil, PA-C 06/05/21 Barron Alvine, MD 06/05/21 1946

## 2021-06-05 NOTE — ED Notes (Signed)
Dc ppw provided. Followup and pain management information prvided. RX information given. Pt denies any questions, pt verbal consent for dc given. Pt off unit on foot

## 2021-06-05 NOTE — ED Notes (Signed)
PT endorsing that they do not have fallopian tubes.  [place] endorsing that because she has ovaries that a urine preg is necessary/

## 2021-06-05 NOTE — Discharge Instructions (Addendum)
Your ultrasound today shows decrease in your right ovarian cyst.  Read and follow discharge care instruction.  Take medication as directed.  Advised establish care with GYN doctor

## 2021-07-27 ENCOUNTER — Emergency Department: Payer: Self-pay

## 2021-07-27 ENCOUNTER — Other Ambulatory Visit: Payer: Self-pay

## 2021-07-27 ENCOUNTER — Emergency Department
Admission: EM | Admit: 2021-07-27 | Discharge: 2021-07-27 | Disposition: A | Discharge status: Home / Self Care | Payer: Self-pay | Attending: Emergency Medicine | Admitting: Emergency Medicine

## 2021-07-27 DIAGNOSIS — K2100 Gastro-esophageal reflux disease with esophagitis, without bleeding: Secondary | ICD-10-CM | POA: Insufficient documentation

## 2021-07-27 DIAGNOSIS — K8050 Calculus of bile duct without cholangitis or cholecystitis without obstruction: Secondary | ICD-10-CM | POA: Insufficient documentation

## 2021-07-27 DIAGNOSIS — R0789 Other chest pain: Secondary | ICD-10-CM | POA: Insufficient documentation

## 2021-07-27 DIAGNOSIS — N9489 Other specified conditions associated with female genital organs and menstrual cycle: Secondary | ICD-10-CM | POA: Insufficient documentation

## 2021-07-27 DIAGNOSIS — R1013 Epigastric pain: Secondary | ICD-10-CM

## 2021-07-27 LAB — BASIC METABOLIC PANEL
Anion gap: 4 — ABNORMAL LOW (ref 5–15)
BUN: 13 mg/dL (ref 6–20)
CO2: 26 mmol/L (ref 22–32)
Calcium: 9 mg/dL (ref 8.9–10.3)
Chloride: 108 mmol/L (ref 98–111)
Creatinine, Ser: 0.54 mg/dL (ref 0.44–1.00)
GFR, Estimated: >60 mL/min (ref >60)
Glucose, Bld: 93 mg/dL (ref 70–99)
Potassium: 3.7 mmol/L (ref 3.5–5.1)
Sodium: 138 mmol/L (ref 135–145)

## 2021-07-27 LAB — POC URINE PREG, ED: Preg Test, Ur: NEGATIVE

## 2021-07-27 LAB — HCG, QUANTITATIVE, PREGNANCY: hCG, Beta Chain, Quant, S: 2 m[IU]/mL (ref <5)

## 2021-07-27 LAB — CBC
HCT: 35 % — ABNORMAL LOW (ref 36.0–46.0)
Hemoglobin: 12.3 g/dL (ref 12.0–15.0)
MCH: 33 pg (ref 26.0–34.0)
MCHC: 35.1 g/dL (ref 30.0–36.0)
MCV: 93.8 fL (ref 80.0–100.0)
Platelets: 209 10*3/uL (ref 150–400)
RBC: 3.73 MIL/uL — ABNORMAL LOW (ref 3.87–5.11)
RDW: 11.7 % (ref 11.5–15.5)
WBC: 8.5 10*3/uL (ref 4.0–10.5)
nRBC: 0 % (ref 0.0–0.2)

## 2021-07-27 LAB — TROPONIN I (HIGH SENSITIVITY)
Troponin I (High Sensitivity): <2 ng/L (ref <18)
Troponin I (High Sensitivity): 5 ng/L (ref <18)

## 2021-07-27 LAB — LIPASE, BLOOD: Lipase: 41 U/L (ref 11–51)

## 2021-07-27 LAB — HEPATIC FUNCTION PANEL
ALT: 24 U/L (ref 0–44)
AST: 29 U/L (ref 15–41)
Albumin: 3.8 g/dL (ref 3.5–5.0)
Alkaline Phosphatase: 132 U/L — ABNORMAL HIGH (ref 38–126)
Bilirubin, Direct: <0.1 mg/dL (ref 0.0–0.2)
Total Bilirubin: 0.5 mg/dL (ref 0.3–1.2)
Total Protein: 6.7 g/dL (ref 6.5–8.1)

## 2021-07-27 MED ORDER — ONDANSETRON 4 MG PO TBDP: 4.0000 mg | ORAL_TABLET | Freq: Three times a day (TID) | ORAL | 0 refills | Status: DC | PRN

## 2021-07-27 MED ORDER — LIDOCAINE VISCOUS HCL 2 % MT SOLN
15.0000 mL | Freq: Once | OROMUCOSAL | Status: AC
  Administered 2021-07-27: 15 mL via ORAL
  Filled 2021-07-27: qty 15

## 2021-07-27 MED ORDER — OMEPRAZOLE MAGNESIUM 20 MG PO TBEC: 20.0000 mg | DELAYED_RELEASE_TABLET | Freq: Every day | ORAL | 1 refills | Status: DC

## 2021-07-27 MED ORDER — HYDROCODONE-ACETAMINOPHEN 5-325 MG PO TABS: 1.0000 | ORAL_TABLET | ORAL | 0 refills | Status: DC | PRN

## 2021-07-27 MED ORDER — IOHEXOL 350 MG/ML SOLN
80.0000 mL | Freq: Once | INTRAVENOUS | Status: AC | PRN
Start: 2021-07-27 — End: 2021-07-27
  Administered 2021-07-27: 80 mL via INTRAVENOUS

## 2021-07-27 MED ORDER — LACTATED RINGERS IV BOLUS
1000.0000 mL | Freq: Once | INTRAVENOUS | Status: AC
  Administered 2021-07-27: 1000 mL via INTRAVENOUS

## 2021-07-27 MED ORDER — ALUM & MAG HYDROXIDE-SIMETH 200-200-20 MG/5ML PO SUSP
15.0000 mL | Freq: Once | ORAL | Status: AC
  Administered 2021-07-27: 15 mL via ORAL
  Filled 2021-07-27: qty 30

## 2021-07-27 MED ORDER — MORPHINE SULFATE (PF) 4 MG/ML IV SOLN
4.0000 mg | Freq: Once | INTRAVENOUS | Status: AC
Start: 2021-07-27 — End: 2021-07-27
  Administered 2021-07-27: 4 mg via INTRAVENOUS
  Filled 2021-07-27: qty 1

## 2021-07-27 MED ORDER — ONDANSETRON HCL 4 MG/2ML IJ SOLN
4.0000 mg | Freq: Once | INTRAMUSCULAR | Status: AC
Start: 2021-07-27 — End: 2021-07-27
  Administered 2021-07-27: 4 mg via INTRAVENOUS
  Filled 2021-07-27: qty 2

## 2021-07-27 NOTE — ED Triage Notes (Signed)
Pt reports upper back pain that started a couple days ago radiating to central chest today. +shob.

## 2021-07-27 NOTE — ED Provider Notes (Signed)
[facility] Provider Note    Event Date/Time   First MD Initiated Contact with Patient 07/27/21 1501     (approximate)   History   Chief Complaint Back Pain and Chest Pain   HPI  Barbara Arellano is a 46 y.o. female with past medical history of fibroids, depression, anxiety, and uterine fibroids who presents to the ED complaining of chest and abdominal pain.  Patient reports that for the past 2 days she has been dealing with intermittent pain in the center of her lower chest and in her epigastric area.  Pain is described as sharp and stabbing, comes on for a few seconds at a time before abating.  It is not brought on by anything specific and there is nothing that she can do to alleviate the pain.  She reports feeling nauseous at times, woke up last night with "fluid in my mouth."  She states she has not vomited and she denies any changes in her bowel movements, dysuria, hematuria, or flank pain.  She is concerned the pain could be related to her gallbladder.  She does report that she has been feeling short of breath at times, denies any fevers or cough.  She has not noticed any pain or swelling in her legs.  She complains of pain in the middle of her upper back, states this has been going on for multiple months and has seen her PCP for this.  Back pain is unchanged today.     Physical Exam   Triage Vital Signs: ED Triage Vitals  Enc Vitals Group     BP 07/27/21 1146 (!) 123/96     Pulse Rate 07/27/21 1146 75     Resp 07/27/21 1146 20     Temp 07/27/21 1146 98.5 F (36.9 C)     Temp src --      SpO2 07/27/21 1146 97 %     Weight 07/27/21 1145 168 lb (76.2 kg)     Height 07/27/21 1145 5\' 4"  (1.626 m)     Head Circumference --      Peak Flow --      Pain Score 07/27/21 1145 10     Pain Loc --      Pain Edu? --      Excl. in [place]? --     Most recent vital signs: Vitals:   07/27/21 1900 07/27/21 1950  BP: 129/82 (!) 131/91  Pulse: (!) 56 63  Resp: 12  20  Temp:    SpO2: 95% 99%    Constitutional: Alert and oriented. Eyes: Conjunctivae are normal. Head: Atraumatic. Nose: No congestion/rhinnorhea. Mouth/Throat: Mucous membranes are moist.  Cardiovascular: Normal rate, regular rhythm. Grossly normal heart sounds.  2+ radial pulses bilaterally. Respiratory: Normal respiratory effort.  No retractions. Lungs CTAB.  No chest wall tenderness to palpation noted. Gastrointestinal: Soft and tender to palpation in the epigastrium with no rebound or guarding.  No distention. Musculoskeletal: No lower extremity tenderness nor edema.  Midline thoracic spinal tenderness to palpation noted. Neurologic:  Normal speech and language. No gross focal neurologic deficits are appreciated.    ED Results / Procedures / Treatments   Labs (all labs ordered are listed, but only abnormal results are displayed) Labs Reviewed  BASIC METABOLIC PANEL - Abnormal; Notable for the following components:      Result Value   Anion gap 4 (*)    All other components within normal limits  CBC - Abnormal; Notable for the following  components:   RBC 3.73 (*)    HCT 35.0 (*)    All other components within normal limits  HEPATIC FUNCTION PANEL - Abnormal; Notable for the following components:   Alkaline Phosphatase 132 (*)    All other components within normal limits  LIPASE, BLOOD  HCG, QUANTITATIVE, PREGNANCY  POC URINE PREG, ED  TROPONIN I (HIGH SENSITIVITY)  TROPONIN I (HIGH SENSITIVITY)     EKG  ED ECG REPORT I, Blake Divine, the attending physician, personally viewed and interpreted this ECG.   Date: 07/27/2021  EKG Time: 11:53  Rate: 72  Rhythm: normal sinus rhythm  Axis: Normal  Intervals:none  ST&T Change: None  RADIOLOGY Chest x-ray reviewed by me with no infiltrate, edema, or effusion.  PROCEDURES:  Critical Care performed: No  .1-3 Lead EKG Interpretation Performed by: Blake Divine, MD Authorized by: Blake Divine, MD      Interpretation: normal     ECG rate:  65-80   ECG rate assessment: normal     Rhythm: sinus rhythm     Ectopy: none     Conduction: normal     MEDICATIONS ORDERED IN ED: Medications  alum & mag hydroxide-simeth (MAALOX/MYLANTA) 200-200-20 MG/5ML suspension 15 mL (15 mLs Oral Given 07/27/21 1522)    And  lidocaine (XYLOCAINE) 2 % viscous mouth solution 15 mL (15 mLs Oral Given 07/27/21 1522)  morphine 4 MG/ML injection 4 mg (4 mg Intravenous Given 07/27/21 1910)  ondansetron (ZOFRAN) injection 4 mg (4 mg Intravenous Given 07/27/21 1910)  lactated ringers bolus 1,000 mL (1,000 mLs Intravenous New Bag/Given 07/27/21 1910)  iohexol (OMNIPAQUE) 350 MG/ML injection 80 mL (80 mLs Intravenous Contrast Given 07/27/21 1923)     IMPRESSION / MDM / ASSESSMENT AND PLAN / ED COURSE  I reviewed the triage vital signs and the nursing notes.                              46 y.o. female with past medical history of fibroids, depression, and anxiety presents to the ED complaining of pain in the center of her lower chest and in her epigastric area for the past 2 days occurring intermittently and present for less than 10 seconds at a time.  Differential diagnosis includes, but is not limited to, biliary colic, cholecystitis, pancreatitis, hepatitis, GERD, gastritis, ACS, PE, pneumonia, bronchitis.  Patient is nontoxic-appearing and in no acute distress, vital signs are reassuring.  EKG shows no evidence of arrhythmia or ischemia and initial troponin is negative.  Low suspicion for ACS given her atypical symptoms lasting for less than 10 seconds at a time.  I am able to reproduce her pain with palpation of her epigastrium and GI source seems most likely.  We will further assess with right upper quadrant ultrasound, treat symptomatically with GI cocktail.  We will also add on LFTs and lipase, CBC and BMP show no evidence of anemia or electrolyte abnormality.  Low suspicion for PE as patient is PERC negative, chest  x-ray is unremarkable.  Back pain appears chronic for patient and is reproducible with palpation of her thoracic spine, doubt dissection.  Right upper quadrant ultrasound shows cholelithiasis without evidence of cholecystitis.  On reassessment, patient continues to have pain in her epigastrium and her back.  CTA of chest is negative for PE or other acute process, CT of abdomen/pelvis is again remarkable for gallstones and shows signs of GERD, but otherwise reassuring.  Patient likely has  component of biliary colic and GERD contributing to her symptoms.  She is appropriate for outpatient management and follow-up with general surgery.  She will be prescribed short course of pain medication along with nausea medication and omeprazole.  She was counseled to return to the ED for new worsening symptoms, patient agrees with plan.       FINAL CLINICAL IMPRESSION(S) / ED DIAGNOSES   Final diagnoses:  Epigastric pain  Biliary colic  Gastroesophageal reflux disease with esophagitis without hemorrhage     Rx / DC Orders   ED Discharge Orders          Ordered    omeprazole (PRILOSEC OTC) 20 MG tablet  Daily        07/27/21 2028    HYDROcodone-acetaminophen (NORCO/VICODIN) 5-325 MG tablet  Every 4 hours PRN        07/27/21 2028    ondansetron (ZOFRAN-ODT) 4 MG disintegrating tablet  Every 8 hours PRN        07/27/21 2028             Note:  This document was prepared using Dragon voice recognition software and may include unintentional dictation errors.   Blake Divine, MD 07/27/21 2029

## 2021-07-27 NOTE — ED Provider Triage Note (Signed)
Emergency Medicine Provider Triage Evaluation Note  Barbara Arellano , a 46 y.o. female  was evaluated in triage.  Pt complains of epigastric abdominal and midback pain.  She notes onset 2 days prior.  Today she endorses ongoing central chest pain with associated shortness of breath.  She presents via EMS from her worksite.  She describes the pain as spasm in nature.  She denies any associated nausea, vomiting, or diarrhea.  Review of Systems  Positive: CP, epigastric abd pain  Negative: NVD  Physical Exam  BP (!) 123/96    Pulse 75    Temp 98.5 F (36.9 C)    Resp 20    Ht 5\' 4"  (1.626 m)    Wt 76.2 kg    LMP 02/22/2020 (Approximate)    SpO2 97%    BMI 28.84 kg/m  Gen:   Awake, no distress   Resp:  Normal effort CTA MSK:   Moves extremities without difficulty  Other:  CVS: RRR  Medical Decision Making  Medically screening exam initiated at 11:54 AM.  Appropriate orders placed.  KAYSI OURADA was informed that the remainder of the evaluation will be completed by another provider, this initial triage assessment does not replace that evaluation, and the importance of remaining in the ED until their evaluation is complete.  Patient with ED evaluation of epigastric abdominal pain with referral to the mid back.   Melvenia Needles, PA-C 07/27/21 1156

## 2021-07-27 NOTE — ED Triage Notes (Signed)
First Nurse Note:  Arrives with [facility].  C/O back, chest, neck and arm pain today.  Describes it as spasms.  Initially pain was in back, Thursday, and pain progressed over the weekend.  EMS reports patient hyperventilating.   VS wnl.

## 2021-08-04 ENCOUNTER — Ambulatory Visit: Payer: Self-pay | Admitting: Surgery

## 2021-08-04 ENCOUNTER — Ambulatory Visit (INDEPENDENT_AMBULATORY_CARE_PROVIDER_SITE_OTHER): Payer: Self-pay | Admitting: Surgery

## 2021-08-04 ENCOUNTER — Other Ambulatory Visit: Payer: Self-pay

## 2021-08-04 ENCOUNTER — Encounter: Payer: Self-pay | Admitting: Surgery

## 2021-08-04 VITALS — BP 147/80 | HR 76 | Temp 98.3°F | Ht 64.0 in | Wt 163.2 lb

## 2021-08-04 DIAGNOSIS — K8010 Calculus of gallbladder with chronic cholecystitis without obstruction: Secondary | ICD-10-CM

## 2021-08-04 NOTE — Patient Instructions (Addendum)
Our surgery scheduler Pamala Hurry will call you within 24-48 hours to get you scheduled. If you have not heard from her after 48 hours, please call our office. You will not need to get Covid tested before surgery and have the blue sheet available when she calls to write down important information.    If you have any concerns or questions, please feel free to call our office.  Cholelithiasis Cholelithiasis happens when gallstones form in the gallbladder. The gallbladder stores bile. Bile is a fluid that helps digest fats. Bile can harden and form into gallstones. If they cause a blockage, they can cause pain (gallbladder attack). What are the causes? This condition may be caused by: Some blood diseases, such as sickle cell anemia. Too much of a fat-like substance (cholesterol) in your bile. Not enough bile salts in your bile. These salts help the body absorb and digest fats. The gallbladder not emptying fully or often enough. This is common in pregnant women. What increases the risk? The following factors may make you more likely to develop this condition: Being female. Being pregnant many times. Eating a lot of fried foods, fat, and refined carbs (refined carbohydrates). Being very overweight (obese). Being older than age 73. Using medicines with female hormones in them for a long time. Losing weight fast. Having gallstones in your family. Having some health problems, such as diabetes, Crohn's disease, or liver disease. What are the signs or symptoms? Often, there may be gallstones but no symptoms. These gallstones are called silent gallstones. If a gallstone causes a blockage, you may get sudden pain. The pain: Can be in the upper right part of your belly (abdomen). Normally comes at night or after you eat. Can last an hour or more. Can spread to your right shoulder, back, or chest. Can feel like discomfort, burning, or fullness in the upper part of your belly (indigestion). If the  blockage lasts more than a few hours, you can get an infection or swelling. You may: Feel like you may vomit. Vomit. Feel bloated. Have belly pain for 5 hours or more. Feel tender in your belly, often in the upper right part and under your ribs. Have fever or chills. Have skin or the white parts of your eyes turn yellow (jaundice). Have dark pee (urine) or pale poop (stool). How is this treated? Treatment for this condition depends on how bad you feel. If you have symptoms, you may need: Home care, if symptoms are not very bad. Do not eat for 12-24 hours. Drink only water and clear liquids. Start to eat simple or clear foods after 1 or 2 days. Try broths and crackers. You may need medicines for pain or stomach upset or both. If you have an infection, you will need antibiotics. A hospital stay, if you have very bad pain or a very bad infection. Surgery to remove your gallbladder. You may need this if: Gallstones keep coming back. You have very bad symptoms. Medicines to break up gallstones. Medicines: Are best for small gallstones. May be used for up to 6-12 months. A procedure to find and take out gallstones or to break up gallstones. Follow these instructions at home: Medicines Take over-the-counter and prescription medicines only as told by your doctor. If you were prescribed an antibiotic medicine, take it as told by your doctor. Do not stop taking the antibiotic even if you start to feel better. Ask your doctor if the medicine prescribed to you requires you to avoid driving or using machinery.  Eating and drinking Drink enough fluid to keep your urine pale yellow. Drink water or clear fluids. This is important when you have pain. Eat healthy foods. Choose: Fewer fatty foods, such as fried foods. Fewer refined carbs. Avoid breads and grains that are highly processed, such as white bread and white rice. Choose whole grains, such as whole-wheat bread and brown rice. More fiber.  Almonds, fresh fruit, and beans are healthy sources. General instructions Keep a healthy weight. Keep all follow-up visits as told by your doctor. This is important. Where to find more information [employer] of Diabetes and Digestive and Kidney Diseases: [email or web address] Contact a doctor if: You have sudden pain in the upper right part of your belly. Pain might spread to your right shoulder, back, or chest. You have been diagnosed with gallstones that have no symptoms and you get: Belly pain. Discomfort, burning, or fullness in the upper part of your abdomen. You have dark urine or pale stools. Get help right away if: You have sudden pain in the upper right part of your abdomen, and the pain lasts more than 2 hours. You have pain in your abdomen, and: It lasts more than 5 hours. It keeps getting worse. You have a fever or chills. You keep feeling like you may vomit. You keep vomiting. Your skin or the white parts of your eyes turn yellow. Summary Cholelithiasis happens when gallstones form in the gallbladder. This condition may be caused by a blood disease, too much of a fat-like substance in the bile, or not enough bile salts in bile. Treatment for this condition depends on how bad you feel. If you have symptoms, do not eat or drink. You may need medicines. You may need a hospital stay for very bad pain or a very bad infection. You may need surgery if gallstones keep coming back or if you have very bad symptoms. This information is not intended to replace advice given to you by your health care provider. Make sure you discuss any questions you have with your health care provider.  Gallbladder Eating Plan If you have a gallbladder condition, you may have trouble digesting fats. Eating a low-fat diet can help reduce your symptoms, and may be helpful before and after having surgery to remove your gallbladder (cholecystectomy). Your health care provider may recommend that you work  with a diet and nutrition specialist (dietitian) to help you reduce the amount of fat in your diet. What are tips for following this plan? General guidelines Limit your fat intake to less than 30% of your total daily calories. If you eat around 1,800 calories each day, this is less than 60 grams (g) of fat per day. Fat is an important part of a healthy diet. Eating a low-fat diet can make it hard to maintain a healthy body weight. Ask your dietitian how much fat, calories, and other nutrients you need each day. Eat small, frequent meals throughout the day instead of three large meals. Drink at least 8-10 cups of fluid a day. Drink enough fluid to keep your urine clear or pale yellow. Limit alcohol intake to no more than 1 drink a day for nonpregnant women and 2 drinks a day for men. One drink equals 12 oz of beer, 5 oz of wine, or 1 oz of hard liquor. Reading food labels  Check Nutrition Facts on food labels for the amount of fat per serving. Choose foods with less than 3 grams of fat per serving. Shopping Choose nonfat and low-fat  healthy foods. Look for the words "nonfat," "low fat," or "fat free." Avoid buying processed or prepackaged foods. Cooking Cook using low-fat methods, such as baking, broiling, grilling, or boiling. Cook with small amounts of healthy fats, such as olive oil, grapeseed oil, canola oil, or sunflower oil. What foods are recommended? All fresh, frozen, or canned fruits and vegetables. Whole grains. Low-fat or non-fat (skim) milk and yogurt. Lean meat, skinless poultry, fish, eggs, and beans. Low-fat protein supplement powders or drinks. Spices and herbs. What foods are not recommended? High-fat foods. These include baked goods, fast food, fatty cuts of meat, ice cream, french toast, sweet rolls, pizza, cheese bread, foods covered with butter, creamy sauces, or cheese. Fried foods. These include french fries, tempura, battered fish, breaded chicken, fried breads, and  sweets. Foods with strong odors. Foods that cause bloating and gas. Summary A low-fat diet can be helpful if you have a gallbladder condition, or before and after gallbladder surgery. Limit your fat intake to less than 30% of your total daily calories. This is about 60 g of fat if you eat 1,800 calories each day. Eat small, frequent meals throughout the day instead of three large meals. This information is not intended to replace advice given to you by your health care provider. Make sure you discuss any questions you have with your health care provider. Document Revised: 02/01/2020 Document Reviewed: 02/07/2020 Elsevier Patient Education  2022 [employer].

## 2021-08-04 NOTE — H&P (View-Only) (Signed)
Patient ID: Barbara Arellano, female   DOB: Jun 03, 1976, 46 y.o.   MRN: [id number]  Chief Complaint: Right upper quadrant pain  History of Present Illness Barbara Arellano is a 46 y.o. female with a progressive history of postprandial right upper quadrant pain.  Now more painful than not.  Associated nausea, without vomiting.  Pain exacerbates to a height of 8 out of 10, spontaneously resolves.  Not certain what kinds of foods provoke it, she is currently avoiding fried or greasy foods.  She denies fevers and chills.  She is very anxious to get surgery done so she can not miss work.  Past Medical History Past Medical History:  Diagnosis Date   Anxiety    Cervical arthritis    Depression    Fibroid, uterine    Pneumonia       Past Surgical History:  Procedure Laterality Date   BILATERAL SALPINGECTOMY     age 45   BREAST BIOPSY Right 12/15/2017   neg   FRACTURE SURGERY Left    foot   LAPAROSCOPIC LYSIS OF ADHESIONS Right 03/25/2020   Procedure: Laparoscopy with Right Ovarian Cystectomy;  Surgeon: Gae Dry, MD;  Location: [facility];  Service: Gynecology;  Laterality: Right;   OOPHORECTOMY Left 2015   ruptured cyst   OVARIAN CYST REMOVAL      No Known Allergies  Current Outpatient Medications  Medication Sig Dispense Refill   HYDROcodone-acetaminophen (NORCO/VICODIN) 5-325 MG tablet Take 1 tablet by mouth every 4 (four) hours as needed for moderate pain. 12 tablet 0   ketorolac (TORADOL) 10 MG tablet Take 1 tablet (10 mg total) by mouth every 6 (six) hours as needed. 20 tablet 0   omeprazole (PRILOSEC OTC) 20 MG tablet Take 1 tablet (20 mg total) by mouth daily. 28 tablet 1   orphenadrine (NORFLEX) 100 MG tablet Take 1 tablet (100 mg total) by mouth 2 (two) times daily. 10 tablet 0   ondansetron (ZOFRAN-ODT) 4 MG disintegrating tablet Take 1 tablet (4 mg total) by mouth every 8 (eight) hours as needed for nausea or vomiting. (Patient not taking: Reported on 08/04/2021) 12  tablet 0   No current facility-administered medications for this visit.    Family History Family History  Problem Relation Age of Onset   Heart disease Mother    Cancer Father 35       bone marrow   Heart disease Father    Breast cancer Maternal Aunt        late 57's      Social History Social History   Tobacco Use   Smoking status: Every Day    Packs/day: 0.50    Types: Cigarettes   Smokeless tobacco: Never  Vaping Use   Vaping Use: Never used  Substance Use Topics   Alcohol use: No   Drug use: Yes    Types: Marijuana    Comment: occassional        Review of Systems  Constitutional: Negative.   HENT: Negative.    Eyes:  Positive for blurred vision.  Respiratory:  Positive for cough and shortness of breath.   Cardiovascular: Negative.   Gastrointestinal:  Positive for diarrhea and nausea.  Genitourinary: Negative.   Skin: Negative.   Neurological:  Positive for tingling and headaches. Negative for seizures.  Psychiatric/Behavioral: Negative.       Physical Exam Blood pressure (!) 147/80, pulse 76, temperature 98.3 F (36.8 C), temperature source Oral, height 5\' 4"  (1.626 m), weight 163 lb 3.2  oz (74 kg), last menstrual period 02/22/2020, SpO2 98 %. Last Weight  Most recent update: 08/04/2021 11:20 AM    Weight  74 kg (163 lb 3.2 oz)             CONSTITUTIONAL: Well developed, and nourished, appropriately responsive and aware without distress.   EYES: Sclera non-icteric.   EARS, NOSE, MOUTH AND THROAT: Mask worn.   The oropharynx is clear. Oral mucosa is pink and moist.   Hearing is intact to voice.  NECK: Trachea is midline, and there is no jugular venous distension.  LYMPH NODES:  Lymph nodes in the neck are not enlarged. RESPIRATORY:  Lungs are clear, and breath sounds are equal bilaterally. Normal respiratory effort without pathologic use of accessory muscles. CARDIOVASCULAR: Heart is regular in rate and rhythm. GI: The abdomen is notable for mild  right upper quadrant tenderness at the height of inspiration, negative Murphy sign.  Otherwise soft, nontender, and nondistended. There were no palpable masses. I did not appreciate hepatosplenomegaly. There were normal bowel sounds.  MUSCULOSKELETAL:  Symmetrical muscle tone appreciated in all four extremities.    SKIN: Skin turgor is normal. No pathologic skin lesions appreciated.  NEUROLOGIC:  Motor and sensation appear grossly normal.  Cranial nerves are grossly without defect. PSYCH:  Alert and oriented to person, place and time. Affect is appropriate for situation.  Data Reviewed I have personally reviewed what is currently available of the patient's imaging, recent labs and medical records.   Labs:  CBC Latest Ref Rng & Units 07/27/2021 06/05/2021 03/26/2021  WBC 4.0 - 10.5 K/uL 8.5 4.4 8.4  Hemoglobin 12.0 - 15.0 g/dL 12.3 13.5 14.3  Hematocrit 36.0 - 46.0 % 35.0(L) 38.4 39.6  Platelets 150 - 400 K/uL 209 201 222   CMP Latest Ref Rng & Units 07/27/2021 06/05/2021 03/26/2021  Glucose 70 - 99 mg/dL 93 91 90  BUN 6 - 20 mg/dL 13 13 14   Creatinine 0.44 - 1.00 mg/dL 0.54 0.49 0.50  Sodium 135 - 145 mmol/L 138 137 138  Potassium 3.5 - 5.1 mmol/L 3.7 3.4(L) 4.1  Chloride 98 - 111 mmol/L 108 104 106  CO2 22 - 32 mmol/L 26 26 24   Calcium 8.9 - 10.3 mg/dL 9.0 9.2 9.4  Total Protein 6.5 - 8.1 g/dL 6.7 - 7.8  Total Bilirubin 0.3 - 1.2 mg/dL 0.5 - 0.8  Alkaline Phos 38 - 126 U/L 132(H) - 101  AST 15 - 41 U/L 29 - 30  ALT 0 - 44 U/L 24 - 27      Imaging: Radiology review:  EXAM: ULTRASOUND ABDOMEN LIMITED RIGHT UPPER QUADRANT   COMPARISON:  CT abdomen and pelvis 03/09/2020   FINDINGS: Gallbladder:   10 mm gallstone. No gallbladder wall thickening. No sonographic Murphy sign noted by sonographer.   Common bile duct:   Diameter: 3 mm   Liver:   No focal lesion identified. Within normal limits in parenchymal echogenicity. Portal vein is patent on color Doppler imaging  with normal direction of blood flow towards the liver.   Other: None.   IMPRESSION: Cholelithiasis without evidence of cholecystitis.     Electronically Signed   By: Logan Bores M.D.   On: 07/27/2021 15:59   Assessment    Chronic calculus cholecystitis with biliary colic  Patient Active Problem List   Diagnosis Date Noted   Right lower quadrant pain 03/25/2020   Right ovarian cyst 03/20/2020   Vaginal bleeding 12/05/2017   Iron deficiency anemia due to chronic  blood loss 12/05/2017    Plan    Robotic cholecystectomy with ICG imaging. This was discussed thoroughly.  Optimal plan is for robotic cholecystectomy.  Risks and benefits have been discussed with the patient which include but are not limited to anesthesia, bleeding, infection, biliary ductal injury or stenosis, other associated unanticipated injuries affiliated with laparoscopic surgery.  I believe there is the desire to proceed, interpreter utilized as needed.  Questions elicited and answered to satisfaction.  No guarantees ever expressed or implied.   Face-to-face time spent with the patient and accompanying care providers(if present) was 30 minutes, with more than 50% of the time spent counseling, educating, and coordinating care of the patient.    These notes generated with voice recognition software. I apologize for typographical errors.  Ronny Bacon M.D., FACS 08/04/2021, 12:02 PM

## 2021-08-04 NOTE — Progress Notes (Signed)
Patient ID: Barbara Arellano, female   DOB: [birth year], 46 y.o.   MRN: [id number]  Chief Complaint: Right upper quadrant pain  History of Present Illness Barbara Arellano is a 46 y.o. female with a progressive history of postprandial right upper quadrant pain.  Now more painful than not.  Associated nausea, without vomiting.  Pain exacerbates to a height of 8 out of 10, spontaneously resolves.  Not certain what kinds of foods provoke it, she is currently avoiding fried or greasy foods.  She denies fevers and chills.  She is very anxious to get surgery done so she can not miss work.  Past Medical History Past Medical History:  Diagnosis Date   Anxiety    Cervical arthritis    Depression    Fibroid, uterine    Pneumonia       Past Surgical History:  Procedure Laterality Date   BILATERAL SALPINGECTOMY     age 22   BREAST BIOPSY Right 12/15/2017   neg   FRACTURE SURGERY Left    foot   LAPAROSCOPIC LYSIS OF ADHESIONS Right 03/25/2020   Procedure: Laparoscopy with Right Ovarian Cystectomy;  Surgeon: Gae Dry, MD;  Location: [facility];  Service: Gynecology;  Laterality: Right;   OOPHORECTOMY Left 2015   ruptured cyst   OVARIAN CYST REMOVAL      No Known Allergies  Current Outpatient Medications  Medication Sig Dispense Refill   HYDROcodone-acetaminophen (NORCO/VICODIN) 5-325 MG tablet Take 1 tablet by mouth every 4 (four) hours as needed for moderate pain. 12 tablet 0   ketorolac (TORADOL) 10 MG tablet Take 1 tablet (10 mg total) by mouth every 6 (six) hours as needed. 20 tablet 0   omeprazole (PRILOSEC OTC) 20 MG tablet Take 1 tablet (20 mg total) by mouth daily. 28 tablet 1   orphenadrine (NORFLEX) 100 MG tablet Take 1 tablet (100 mg total) by mouth 2 (two) times daily. 10 tablet 0   ondansetron (ZOFRAN-ODT) 4 MG disintegrating tablet Take 1 tablet (4 mg total) by mouth every 8 (eight) hours as needed for nausea or vomiting. (Patient not taking: Reported on 08/04/2021) 12  tablet 0   No current facility-administered medications for this visit.    Family History Family History  Problem Relation Age of Onset   Heart disease Mother    Cancer Father 1       bone marrow   Heart disease Father    Breast cancer Maternal Aunt        late 9's      Social History Social History   Tobacco Use   Smoking status: Every Day    Packs/day: 0.50    Types: Cigarettes   Smokeless tobacco: Never  Vaping Use   Vaping Use: Never used  Substance Use Topics   Alcohol use: No   Drug use: Yes    Types: Marijuana    Comment: occassional        Review of Systems  Constitutional: Negative.   HENT: Negative.    Eyes:  Positive for blurred vision.  Respiratory:  Positive for cough and shortness of breath.   Cardiovascular: Negative.   Gastrointestinal:  Positive for diarrhea and nausea.  Genitourinary: Negative.   Skin: Negative.   Neurological:  Positive for tingling and headaches. Negative for seizures.  Psychiatric/Behavioral: Negative.       Physical Exam Blood pressure (!) 147/80, pulse 76, temperature 98.3 F (36.8 C), temperature source Oral, height 5\' 4"  (1.626 m), weight 163 lb 3.2  oz (74 kg), last menstrual period 02/22/2020, SpO2 98 %. Last Weight  Most recent update: 08/04/2021 11:20 AM    Weight  74 kg (163 lb 3.2 oz)             CONSTITUTIONAL: Well developed, and nourished, appropriately responsive and aware without distress.   EYES: Sclera non-icteric.   EARS, NOSE, MOUTH AND THROAT: Mask worn.   The oropharynx is clear. Oral mucosa is pink and moist.   Hearing is intact to voice.  NECK: Trachea is midline, and there is no jugular venous distension.  LYMPH NODES:  Lymph nodes in the neck are not enlarged. RESPIRATORY:  Lungs are clear, and breath sounds are equal bilaterally. Normal respiratory effort without pathologic use of accessory muscles. CARDIOVASCULAR: Heart is regular in rate and rhythm. GI: The abdomen is notable for mild  right upper quadrant tenderness at the height of inspiration, negative Murphy sign.  Otherwise soft, nontender, and nondistended. There were no palpable masses. I did not appreciate hepatosplenomegaly. There were normal bowel sounds.  MUSCULOSKELETAL:  Symmetrical muscle tone appreciated in all four extremities.    SKIN: Skin turgor is normal. No pathologic skin lesions appreciated.  NEUROLOGIC:  Motor and sensation appear grossly normal.  Cranial nerves are grossly without defect. PSYCH:  Alert and oriented to person, place and time. Affect is appropriate for situation.  Data Reviewed I have personally reviewed what is currently available of the patient's imaging, recent labs and medical records.   Labs:  CBC Latest Ref Rng & Units 07/27/2021 06/05/2021 03/26/2021  WBC 4.0 - 10.5 K/uL 8.5 4.4 8.4  Hemoglobin 12.0 - 15.0 g/dL 12.3 13.5 14.3  Hematocrit 36.0 - 46.0 % 35.0(L) 38.4 39.6  Platelets 150 - 400 K/uL 209 201 222   CMP Latest Ref Rng & Units 07/27/2021 06/05/2021 03/26/2021  Glucose 70 - 99 mg/dL 93 91 90  BUN 6 - 20 mg/dL 13 13 14   Creatinine 0.44 - 1.00 mg/dL 0.54 0.49 0.50  Sodium 135 - 145 mmol/L 138 137 138  Potassium 3.5 - 5.1 mmol/L 3.7 3.4(L) 4.1  Chloride 98 - 111 mmol/L 108 104 106  CO2 22 - 32 mmol/L 26 26 24   Calcium 8.9 - 10.3 mg/dL 9.0 9.2 9.4  Total Protein 6.5 - 8.1 g/dL 6.7 - 7.8  Total Bilirubin 0.3 - 1.2 mg/dL 0.5 - 0.8  Alkaline Phos 38 - 126 U/L 132(H) - 101  AST 15 - 41 U/L 29 - 30  ALT 0 - 44 U/L 24 - 27      Imaging: Radiology review:  EXAM: ULTRASOUND ABDOMEN LIMITED RIGHT UPPER QUADRANT   COMPARISON:  CT abdomen and pelvis 03/09/2020   FINDINGS: Gallbladder:   10 mm gallstone. No gallbladder wall thickening. No sonographic Murphy sign noted by sonographer.   Common bile duct:   Diameter: 3 mm   Liver:   No focal lesion identified. Within normal limits in parenchymal echogenicity. Portal vein is patent on color Doppler imaging  with normal direction of blood flow towards the liver.   Other: None.   IMPRESSION: Cholelithiasis without evidence of cholecystitis.     Electronically Signed   By: Logan Bores M.D.   On: 07/27/2021 15:59   Assessment    Chronic calculus cholecystitis with biliary colic  Patient Active Problem List   Diagnosis Date Noted   Right lower quadrant pain 03/25/2020   Right ovarian cyst 03/20/2020   Vaginal bleeding 12/05/2017   Iron deficiency anemia due to chronic  blood loss 12/05/2017    Plan    Robotic cholecystectomy with ICG imaging. This was discussed thoroughly.  Optimal plan is for robotic cholecystectomy.  Risks and benefits have been discussed with the patient which include but are not limited to anesthesia, bleeding, infection, biliary ductal injury or stenosis, other associated unanticipated injuries affiliated with laparoscopic surgery.  I believe there is the desire to proceed, interpreter utilized as needed.  Questions elicited and answered to satisfaction.  No guarantees ever expressed or implied.   Face-to-face time spent with the patient and accompanying care providers(if present) was 30 minutes, with more than 50% of the time spent counseling, educating, and coordinating care of the patient.    These notes generated with voice recognition software. I apologize for typographical errors.  Ronny Bacon M.D., FACS 08/04/2021, 12:02 PM

## 2021-08-05 ENCOUNTER — Telehealth: Payer: Self-pay | Admitting: Surgery

## 2021-08-05 NOTE — Telephone Encounter (Signed)
Patient has been advised of Pre-Admission date/time, COVID Testing date and Surgery date.  Surgery Date: 08/07/21 Preadmission Testing Date: 08/06/21 (phone 7:30 am 1:00 pm) Covid Testing Date: Not needed.    Patient has been made aware to call [phone], between 1-3:00pm the day before surgery, to find out what time to arrive for surgery.

## 2021-08-06 ENCOUNTER — Other Ambulatory Visit
Admission: RE | Admit: 2021-08-06 | Discharge: 2021-08-06 | Disposition: A | Discharge status: Home / Self Care | Payer: Self-pay | Source: Ambulatory Visit | Attending: Surgery | Admitting: Surgery

## 2021-08-06 ENCOUNTER — Other Ambulatory Visit: Payer: Self-pay

## 2021-08-06 HISTORY — DX: Gastro-esophageal reflux disease without esophagitis: K21.9

## 2021-08-06 NOTE — Patient Instructions (Signed)
Your procedure is scheduled on: Friday August 07, 2021. Report to Day Surgery inside [place] 2nd floor stop by admissions desk before getting on elevator.  To find out your arrival time please call [phone] between 1PM - 3PM on Thursday August 06, 2021.  Remember: Instructions that are not followed completely may result in serious medical risk,  up to and including death, or upon the discretion of your surgeon and anesthesiologist your  surgery may need to be rescheduled.     _X__ 1. Do not eat food or drink fluids after midnight the night before your procedure.                 No chewing gum or hard candies  __X__2.  On the morning of surgery brush your teeth with toothpaste and water, you                may rinse your mouth with mouthwash if you wish.  Do not swallow any toothpaste or mouthwash.     _X__ 3.  No Alcohol for 24 hours before or after surgery.   _X__ 4.  Do Not Smoke or use e-cigarettes For 24 Hours Prior to Your Surgery.                 Do not use any chewable tobacco products for at least 6 hours prior to                 Surgery.  _X__  5.  Do not use any recreational drugs (marijuana, cocaine, heroin, ecstasy, MDMA or other)                For at least one week prior to your surgery.  Combination of these drugs with anesthesia                May have life threatening results.  ____  6.  Bring all medications with you on the day of surgery if instructed.   __X__  7.  Notify your doctor if there is any change in your medical condition      (cold, fever, infections).     Do not wear jewelry, make-up, hairpins, clips or nail polish. Do not wear lotions, powders, or perfumes. You may wear deodorant. Do not shave 48 hours prior to surgery. Men may shave face and neck. Do not bring valuables to the hospital.    [facility] is not responsible for any belongings or valuables.  Contacts, dentures or bridgework may not be worn into  surgery. Leave your suitcase in the car. After surgery it may be brought to your room. For patients admitted to the hospital, discharge time is determined by your treatment team.   Patients discharged the day of surgery will not be allowed to drive home.   Make arrangements for someone to be with you for the first 24 hours of your Same Day Discharge.   __X__ Take these medicines the morning of surgery with A SIP OF WATER:    1. omeprazole (PRILOSEC OTC) 20 MG  2.   3.   4.  5.  6.  ____ Fleet Enema (as directed)   __X__ Use CHG Soap (or wipes) as directed  ____ Use Benzoyl Peroxide Gel as instructed  ____ Use inhalers on the day of surgery  ____ Stop metformin 2 days prior to surgery    ____ Take 1/2 of usual insulin dose the night before surgery. No insulin the morning  of surgery.   ____ Call your PCP, cardiologist, or Pulmonologist if taking Coumadin/Plavix/aspirin and ask when to stop before your surgery.   __X__ One Week prior to surgery- Stop Anti-inflammatories such as Ibuprofen, Aleve, Advil, Motrin, meloxicam (MOBIC), diclofenac, etodolac, ketorolac, Toradol, Daypro, piroxicam, Goody's or BC powders. OK TO USE TYLENOL IF NEEDED   __X__ Stop supplements until after surgery.    ____ Bring C-Pap to the hospital.    If you have any questions regarding your pre-procedure instructions,  Please call Pre-admit Testing at [phone]

## 2021-08-07 ENCOUNTER — Encounter
Admission: RE | Disposition: A | Discharge status: Home / Self Care | Payer: Self-pay | Source: Home / Self Care | Attending: Surgery

## 2021-08-07 ENCOUNTER — Other Ambulatory Visit: Payer: Self-pay

## 2021-08-07 ENCOUNTER — Ambulatory Visit: Payer: Self-pay | Admitting: Certified Registered Nurse Anesthetist

## 2021-08-07 ENCOUNTER — Other Ambulatory Visit: Payer: Self-pay | Admitting: Surgery

## 2021-08-07 ENCOUNTER — Encounter: Payer: Self-pay | Admitting: Surgery

## 2021-08-07 ENCOUNTER — Ambulatory Visit
Admission: RE | Admit: 2021-08-07 | Discharge: 2021-08-07 | Disposition: A | Discharge status: Home / Self Care | Payer: Self-pay | Attending: Surgery | Admitting: Surgery

## 2021-08-07 DIAGNOSIS — K219 Gastro-esophageal reflux disease without esophagitis: Secondary | ICD-10-CM | POA: Insufficient documentation

## 2021-08-07 DIAGNOSIS — Z90722 Acquired absence of ovaries, bilateral: Secondary | ICD-10-CM | POA: Insufficient documentation

## 2021-08-07 DIAGNOSIS — F17210 Nicotine dependence, cigarettes, uncomplicated: Secondary | ICD-10-CM | POA: Insufficient documentation

## 2021-08-07 DIAGNOSIS — K8010 Calculus of gallbladder with chronic cholecystitis without obstruction: Secondary | ICD-10-CM | POA: Insufficient documentation

## 2021-08-07 SURGERY — CHOLECYSTECTOMY, ROBOT-ASSISTED, LAPAROSCOPIC
Anesthesia: General

## 2021-08-07 MED ORDER — ONDANSETRON HCL 4 MG/2ML IJ SOLN
INTRAMUSCULAR | Status: DC | PRN
  Administered 2021-08-07 (×2): 4 mg via INTRAVENOUS

## 2021-08-07 MED ORDER — FENTANYL CITRATE (PF) 100 MCG/2ML IJ SOLN
INTRAMUSCULAR | Status: AC
  Administered 2021-08-07: 50 ug via INTRAVENOUS
  Filled 2021-08-07: qty 2

## 2021-08-07 MED ORDER — FENTANYL CITRATE (PF) 100 MCG/2ML IJ SOLN
INTRAMUSCULAR | Status: AC
  Filled 2021-08-07: qty 2

## 2021-08-07 MED ORDER — CELECOXIB 200 MG PO CAPS: 200.0000 mg | ORAL_CAPSULE | ORAL | Status: AC

## 2021-08-07 MED ORDER — LACTATED RINGERS IV SOLN: INTRAVENOUS | Status: DC | PRN

## 2021-08-07 MED ORDER — OXYCODONE HCL 5 MG PO TABS
5.0000 mg | ORAL_TABLET | Freq: Once | ORAL | Status: AC | PRN
  Administered 2021-08-07: 5 mg via ORAL

## 2021-08-07 MED ORDER — INDOCYANINE GREEN 25 MG IV SOLR
2.5000 mg | Freq: Once | INTRAVENOUS | Status: AC
  Administered 2021-08-07: 2.5 mg via INTRAVENOUS
  Filled 2021-08-07: qty 1

## 2021-08-07 MED ORDER — GABAPENTIN 300 MG PO CAPS: 300.0000 mg | ORAL_CAPSULE | ORAL | Status: AC

## 2021-08-07 MED ORDER — DEXMEDETOMIDINE (PRECEDEX) IN NS 20 MCG/5ML (4 MCG/ML) IV SYRINGE
PREFILLED_SYRINGE | INTRAVENOUS | Status: DC | PRN
  Administered 2021-08-07: 12 ug via INTRAVENOUS
  Administered 2021-08-07: 8 ug via INTRAVENOUS

## 2021-08-07 MED ORDER — HYDROCODONE-ACETAMINOPHEN 5-325 MG PO TABS: 1.0000 | ORAL_TABLET | ORAL | 0 refills | Status: DC | PRN

## 2021-08-07 MED ORDER — FENTANYL CITRATE (PF) 100 MCG/2ML IJ SOLN
INTRAMUSCULAR | Status: DC | PRN
  Administered 2021-08-07 (×2): 50 ug via INTRAVENOUS
  Administered 2021-08-07: 100 ug via INTRAVENOUS

## 2021-08-07 MED ORDER — DEXAMETHASONE SODIUM PHOSPHATE 10 MG/ML IJ SOLN
INTRAMUSCULAR | Status: DC | PRN
  Administered 2021-08-07: 10 mg via INTRAVENOUS

## 2021-08-07 MED ORDER — PROPOFOL 10 MG/ML IV BOLUS
INTRAVENOUS | Status: DC | PRN
  Administered 2021-08-07: 200 mg via INTRAVENOUS

## 2021-08-07 MED ORDER — ACETAMINOPHEN 500 MG PO TABS
ORAL_TABLET | ORAL | Status: AC
  Administered 2021-08-07: 1000 mg via ORAL
  Filled 2021-08-07: qty 2

## 2021-08-07 MED ORDER — GLYCOPYRROLATE 0.2 MG/ML IJ SOLN
INTRAMUSCULAR | Status: DC | PRN
  Administered 2021-08-07: .2 mg via INTRAVENOUS

## 2021-08-07 MED ORDER — PROPOFOL 10 MG/ML IV BOLUS
INTRAVENOUS | Status: AC
  Filled 2021-08-07: qty 20

## 2021-08-07 MED ORDER — CEFAZOLIN SODIUM-DEXTROSE 2-4 GM/100ML-% IV SOLN
INTRAVENOUS | Status: AC
  Filled 2021-08-07: qty 100

## 2021-08-07 MED ORDER — SUGAMMADEX SODIUM 200 MG/2ML IV SOLN
INTRAVENOUS | Status: DC | PRN
  Administered 2021-08-07: 300 mg via INTRAVENOUS

## 2021-08-07 MED ORDER — CHLORHEXIDINE GLUCONATE 0.12 % MT SOLN: 15.0000 mL | Freq: Once | OROMUCOSAL | Status: AC

## 2021-08-07 MED ORDER — MIDAZOLAM HCL 2 MG/2ML IJ SOLN
INTRAMUSCULAR | Status: DC | PRN
Start: 2021-08-07 — End: 2021-08-07
  Administered 2021-08-07: 2 mg via INTRAVENOUS

## 2021-08-07 MED ORDER — LIDOCAINE HCL (CARDIAC) PF 100 MG/5ML IV SOSY
PREFILLED_SYRINGE | INTRAVENOUS | Status: DC | PRN
Start: 2021-08-07 — End: 2021-08-07
  Administered 2021-08-07: 100 mg via INTRAVENOUS

## 2021-08-07 MED ORDER — ACETAMINOPHEN 500 MG PO TABS: 1000.0000 mg | ORAL_TABLET | ORAL | Status: AC

## 2021-08-07 MED ORDER — MIDAZOLAM HCL 2 MG/2ML IJ SOLN
INTRAMUSCULAR | Status: AC
  Filled 2021-08-07: qty 2

## 2021-08-07 MED ORDER — ROCURONIUM BROMIDE 100 MG/10ML IV SOLN
INTRAVENOUS | Status: DC | PRN
  Administered 2021-08-07: 20 mg via INTRAVENOUS
  Administered 2021-08-07: 40 mg via INTRAVENOUS

## 2021-08-07 MED ORDER — KETOROLAC TROMETHAMINE 30 MG/ML IJ SOLN
INTRAMUSCULAR | Status: DC | PRN
  Administered 2021-08-07: 30 mg via INTRAVENOUS

## 2021-08-07 MED ORDER — FENTANYL CITRATE (PF) 100 MCG/2ML IJ SOLN
25.0000 ug | INTRAMUSCULAR | Status: DC | PRN
  Administered 2021-08-07: 50 ug via INTRAVENOUS
  Administered 2021-08-07: 25 ug via INTRAVENOUS

## 2021-08-07 MED ORDER — CHLORHEXIDINE GLUCONATE 0.12 % MT SOLN
OROMUCOSAL | Status: AC
  Administered 2021-08-07: 15 mL via OROMUCOSAL
  Filled 2021-08-07: qty 15

## 2021-08-07 MED ORDER — FENTANYL CITRATE (PF) 100 MCG/2ML IJ SOLN
INTRAMUSCULAR | Status: AC
  Administered 2021-08-07: 25 ug via INTRAVENOUS
  Filled 2021-08-07: qty 2

## 2021-08-07 MED ORDER — CHLORHEXIDINE GLUCONATE CLOTH 2 % EX PADS: 6.0000 | MEDICATED_PAD | Freq: Once | CUTANEOUS | Status: DC

## 2021-08-07 MED ORDER — CELECOXIB 200 MG PO CAPS
ORAL_CAPSULE | ORAL | Status: AC
  Administered 2021-08-07: 200 mg via ORAL
  Filled 2021-08-07: qty 1

## 2021-08-07 MED ORDER — SUCCINYLCHOLINE CHLORIDE 200 MG/10ML IV SOSY
PREFILLED_SYRINGE | INTRAVENOUS | Status: DC | PRN
  Administered 2021-08-07: 100 mg via INTRAVENOUS

## 2021-08-07 MED ORDER — BUPIVACAINE-EPINEPHRINE (PF) 0.25% -1:200000 IJ SOLN
INTRAMUSCULAR | Status: DC | PRN
  Administered 2021-08-07: 50 mL

## 2021-08-07 MED ORDER — ORAL CARE MOUTH RINSE: 15.0000 mL | Freq: Once | OROMUCOSAL | Status: AC

## 2021-08-07 MED ORDER — GABAPENTIN 300 MG PO CAPS
ORAL_CAPSULE | ORAL | Status: AC
  Administered 2021-08-07: 300 mg via ORAL
  Filled 2021-08-07: qty 1

## 2021-08-07 MED ORDER — CEFAZOLIN SODIUM-DEXTROSE 2-4 GM/100ML-% IV SOLN
2.0000 g | INTRAVENOUS | Status: AC
  Administered 2021-08-07: 2 g via INTRAVENOUS

## 2021-08-07 MED ORDER — OXYCODONE HCL 5 MG/5ML PO SOLN: 5.0000 mg | Freq: Once | ORAL | Status: AC | PRN

## 2021-08-07 MED ORDER — BUPIVACAINE LIPOSOME 1.3 % IJ SUSP
INTRAMUSCULAR | Status: AC
  Filled 2021-08-07: qty 20

## 2021-08-07 MED ORDER — LACTATED RINGERS IV SOLN: INTRAVENOUS | Status: DC

## 2021-08-07 MED ORDER — BUPIVACAINE-EPINEPHRINE (PF) 0.25% -1:200000 IJ SOLN
INTRAMUSCULAR | Status: AC
  Filled 2021-08-07: qty 30

## 2021-08-07 MED ORDER — OXYCODONE HCL 5 MG PO TABS
ORAL_TABLET | ORAL | Status: AC
  Filled 2021-08-07: qty 1

## 2021-08-07 SURGICAL SUPPLY — 46 items
ADH SKN CLS APL DERMABOND .7 (GAUZE/BANDAGES/DRESSINGS) ×1
BAG SPEC RTRVL LRG 6X4 10 (ENDOMECHANICALS) ×1
CANNULA CAP OBTURATR AIRSEAL 8 (CAP) ×2 IMPLANT
CLIP LIGATING HEM O LOK PURPLE (MISCELLANEOUS) ×2 IMPLANT
COVER TIP SHEARS 8 DVNC (MISCELLANEOUS) ×1 IMPLANT
COVER TIP SHEARS 8MM DA VINCI (MISCELLANEOUS) ×1
DECANTER SPIKE VIAL GLASS SM (MISCELLANEOUS) ×2 IMPLANT
DERMABOND ADVANCED (GAUZE/BANDAGES/DRESSINGS) ×1
DERMABOND ADVANCED .7 DNX12 (GAUZE/BANDAGES/DRESSINGS) ×1 IMPLANT
DRAPE ARM DVNC X/XI (DISPOSABLE) ×4 IMPLANT
DRAPE COLUMN DVNC XI (DISPOSABLE) ×1 IMPLANT
DRAPE DA VINCI XI ARM (DISPOSABLE) ×4
DRAPE DA VINCI XI COLUMN (DISPOSABLE) ×1
ELECT CAUTERY BLADE 6.4 (BLADE) ×2 IMPLANT
GLOVE SURG ORTHO LTX SZ7.5 (GLOVE) ×4 IMPLANT
GOWN STRL REUS W/ TWL LRG LVL3 (GOWN DISPOSABLE) ×4 IMPLANT
GOWN STRL REUS W/TWL LRG LVL3 (GOWN DISPOSABLE) ×8
GRASPER SUT TROCAR 14GX15 (MISCELLANEOUS) IMPLANT
INFUSOR MANOMETER BAG 3000ML (MISCELLANEOUS) IMPLANT
IRRIGATION STRYKERFLOW (MISCELLANEOUS) IMPLANT
IRRIGATOR STRYKERFLOW (MISCELLANEOUS)
IRRIGATOR SUCT 8 DISP DVNC XI (IRRIGATION / IRRIGATOR) IMPLANT
IRRIGATOR SUCTION 8MM XI DISP (IRRIGATION / IRRIGATOR)
IV NS IRRIG 3000ML ARTHROMATIC (IV SOLUTION) IMPLANT
KIT PINK PAD W/HEAD ARE REST (MISCELLANEOUS) ×2
KIT PINK PAD W/HEAD ARM REST (MISCELLANEOUS) ×1 IMPLANT
KIT TURNOVER KIT A (KITS) ×2 IMPLANT
LABEL OR SOLS (LABEL) ×2 IMPLANT
MANIFOLD NEPTUNE II (INSTRUMENTS) ×2 IMPLANT
NDL INSUFFLATION 14GA 120MM (NEEDLE) IMPLANT
NEEDLE HYPO 22GX1.5 SAFETY (NEEDLE) ×2 IMPLANT
NEEDLE INSUFFLATION 14GA 120MM (NEEDLE) IMPLANT
NS IRRIG 500ML POUR BTL (IV SOLUTION) ×2 IMPLANT
PACK LAP CHOLECYSTECTOMY (MISCELLANEOUS) ×2 IMPLANT
PENCIL ELECTRO HAND CTR (MISCELLANEOUS) ×2 IMPLANT
POUCH SPECIMEN RETRIEVAL 10MM (ENDOMECHANICALS) ×2 IMPLANT
SEAL CANN UNIV 5-8 DVNC XI (MISCELLANEOUS) ×3 IMPLANT
SEAL XI 5MM-8MM UNIVERSAL (MISCELLANEOUS) ×3
SET TUBE FILTERED XL AIRSEAL (SET/KITS/TRAYS/PACK) ×2 IMPLANT
SOLUTION ELECTROLUBE (MISCELLANEOUS) ×2 IMPLANT
SUT MNCRL 4-0 (SUTURE) ×2
SUT MNCRL 4-0 27XMFL (SUTURE) ×1
SUT VICRYL 0 AB UR-6 (SUTURE) ×2 IMPLANT
SUTURE MNCRL 4-0 27XMF (SUTURE) ×1 IMPLANT
TROCAR Z-THREAD FIOS 11X100 BL (TROCAR) IMPLANT
WATER STERILE IRR 500ML POUR (IV SOLUTION) ×2 IMPLANT

## 2021-08-07 NOTE — Discharge Instructions (Signed)
AMBULATORY SURGERY  ?DISCHARGE INSTRUCTIONS ? ? ?The drugs that you were given will stay in your system until tomorrow so for the next 24 hours you should not: ? ?Drive an automobile ?Make any legal decisions ?Drink any alcoholic beverage ? ? ?You may resume regular meals tomorrow.  Today it is better to start with liquids and gradually work up to solid foods. ? ?You may eat anything you prefer, but it is better to start with liquids, then soup and crackers, and gradually work up to solid foods. ? ? ?Please notify your doctor immediately if you have any unusual bleeding, trouble breathing, redness and pain at the surgery site, drainage, fever, or pain not relieved by medication. ? ? ? ?Additional Instructions: ? ? ? ?Please contact your physician with any problems or Same Day Surgery at [phone], Monday through Friday 6 am to 4 pm, or [place] [facility] [place] Main number at [phone].  ?

## 2021-08-07 NOTE — Progress Notes (Unsigned)
Reorder for post-op pain.

## 2021-08-07 NOTE — Transfer of Care (Signed)
Immediate Anesthesia Transfer of Care Note  Patient: Barbara Arellano  Procedure(s) Performed: XI ROBOTIC ASSISTED LAPAROSCOPIC CHOLECYSTECTOMY INDOCYANINE GREEN FLUORESCENCE IMAGING (ICG)  Patient Location: PACU  Anesthesia Type:General  Level of Consciousness: awake  Airway & Oxygen Therapy: Patient Spontanous Breathing and Patient connected to face mask oxygen  Post-op Assessment: Report given to RN  Post vital signs: stable  Last Vitals:  Vitals Value Taken Time  BP 150/89 08/07/21 1649  Temp 36.4 C 08/07/21 1649  Pulse 85 08/07/21 1652  Resp 18 08/07/21 1652  SpO2 100 % 08/07/21 1652  Vitals shown include unvalidated device data.  Last Pain:  Vitals:   08/07/21 1353  TempSrc: Tympanic  PainSc: 0-No pain         Complications: No notable events documented.

## 2021-08-07 NOTE — Op Note (Signed)
Robotic cholecystectomy with Indocyamine Green Ductal Imaging.   Pre-operative Diagnosis: Chronic calculus cholecystitis  Post-operative Diagnosis:  Same.  Procedure: Robotic assisted laparoscopic cholecystectomy with Indocyamine Green Ductal Imaging.   Surgeon: Ronny Bacon, M.D., FACS  Anesthesia: General. with endotracheal tube  Findings: As expected.  Estimated Blood Loss: 10 mL         Drains: None         Specimens: Gallbladder           Complications: none  Procedure Details  The patient was seen again in the Holding Room.  2.5 mg dose of ICG was administered intravenously.   The benefits, complications, treatment options, risks and expected outcomes were again reviewed with the patient. The likelihood of improving the patient's symptoms with return to their baseline status is good.  The patient and/or family concurred with the proposed plan, giving informed consent, again alternatives reviewed.  The patient was taken to Operating Room, identified, and the procedure verified as robotic assisted laparoscopic cholecystectomy.  Prior to the induction of general anesthesia, antibiotic prophylaxis was administered. VTE prophylaxis was in place. General endotracheal anesthesia was then administered and tolerated well. The patient was positioned in the supine position.  After the induction, the abdomen was prepped with Chloraprep and draped in the sterile fashion.  A Time Out was held and the above information confirmed.  After local infiltration of quarter percent Marcaine with epinephrine, stab incision was made left upper quadrant.  Just below the costal margin at Palmer's point, approximately midclavicular line the Veres needle is passed with sensation of the layers to penetrate the abdominal wall and into the peritoneum.  Saline drop test is confirmed peritoneal placement.  Insufflation is initiated with carbon dioxide to pressures of 15 mmHg.  Right infra-umbilical local  infiltration with quarter percent Marcaine with epinephrine is utilized.  Made a 12 mm incision on the right periumbilical site, I advanced an optical 9mm port under direct visualization into the peritoneal cavity.  Once the peritoneum was penetrated, insufflation was initiated.  The trocar was then advanced into the abdominal cavity under direct visualization. Pneumoperitoneum was then continued with Air seal utilizing CO2 at 15 mmHg or less and tolerated well without any adverse changes in the patient's vital signs.  Two 8.5-mm ports were placed in the left lower quadrant and laterally, and one to the right lower quadrant, all under direct vision. All skin incisions  were infiltrated with a local anesthetic agent before making the incision and placing the trocars.  The patient was positioned  in reverse Trendelenburg, tilted the patient's left side down.  Da Vinci XI robot was then positioned on to the patient's left side, and docked.  The gallbladder was identified, the fundus grasped via the arm 4 Prograsp and retracted cephalad. Adhesions were lysed with scissors and cautery.  The infundibulum was identified grasped and retracted laterally, exposing the peritoneum overlying the triangle of Calot. This was then opened and dissected using cautery & scissors. An extended critical view of the cystic duct and cystic artery was obtained, aided by the ICG via FireFly which enabled ready visualization of the ductal anatomy.    The cystic duct was clearly identified and dissected to isolation.   Artery well isolated and clipped, and the cystic duct was triple clipped and divided with scissors, as close to the gallbladder neck as feasible, thus leaving two on the remaining stump.  The specimen side of the artery is sealed with bipolar and divided with monopolar  scissors.   The gallbladder was taken from the gallbladder fossa in a retrograde fashion with the electrocautery. The gallbladder was removed and placed  in an Endocatch bag.  The liver bed is inspected. Hemostasis was confirmed.  The robot was undocked and moved away from the operative field. No irrigation was utilized.  The gallbladder and Endocatch sac were then removed through the infraumbilical port site.   Inspection of the right upper quadrant was performed. No bleeding, bile duct injury or leak, or bowel injury was noted. The infra-umbilical port site fascia was closed with interrumpted 0 Vicryl sutures using PMI/cone under direct visualization. Pneumoperitoneum was released and ports removed.  4-0 subcuticular Monocryl was used to close the skin. Dermabond was  applied.  The patient was then extubated and brought to the recovery room in stable condition. Sponge, lap, and needle counts were correct at closure and at the conclusion of the case.               Ronny Bacon, M.D., [facility] 08/07/2021 5:04 PM

## 2021-08-07 NOTE — Anesthesia Preprocedure Evaluation (Signed)
Anesthesia Evaluation  Patient identified by MRN, date of birth, ID band Patient awake    Reviewed: Allergy & Precautions, NPO status , Patient's Chart, lab work & pertinent test results  History of Anesthesia Complications Negative for: history of anesthetic complications  Airway Mallampati: III  TM Distance: >3 FB Neck ROM: full    Dental  (+) Chipped, Poor Dentition, Missing   Pulmonary neg shortness of breath, Current Smoker and Patient abstained from smoking.,    Pulmonary exam normal        Cardiovascular Exercise Tolerance: Good (-) angina(-) Past MI and (-) DOE Normal cardiovascular exam     Neuro/Psych PSYCHIATRIC DISORDERS negative neurological ROS  negative psych ROS   GI/Hepatic Neg liver ROS, GERD  Controlled,  Endo/Other  negative endocrine ROS  Renal/GU      Musculoskeletal  (+) Arthritis ,   Abdominal   Peds  Hematology negative hematology ROS (+)   Anesthesia Other Findings Past Medical History: No date: Anxiety No date: Cervical arthritis No date: Depression 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, uterine No date: GERD (gastroesophageal reflux disease) No date: Pneumonia  Past Surgical History: No date: BILATERAL SALPINGECTOMY     Comment:  age 46 12/15/2017: BREAST BIOPSY; Right     Comment:  neg 2015: FRACTURE SURGERY; Left     Comment:  foot 03/25/2020: LAPAROSCOPIC LYSIS OF ADHESIONS; Right     Comment:  Procedure: Laparoscopy with Right Ovarian Cystectomy;                Surgeon: Gae Dry, MD;  Location: [facility] ORS;                Service: Gynecology;  Laterality: Right; 2015: OOPHORECTOMY; Left     Comment:  ruptured cyst No date: OVARIAN CYST REMOVAL No date: TUBAL LIGATION  BMI    Body Mass Index: 27.98 kg/m      Reproductive/Obstetrics negative OB ROS                             Anesthesia Physical Anesthesia Plan  ASA: 3  Anesthesia Plan: General ETT    Post-op Pain Management:    Induction: Intravenous  PONV Risk Score and Plan: Ondansetron, Dexamethasone, Midazolam and Treatment may vary due to age or medical condition  Airway Management Planned: Oral ETT  Additional Equipment:   Intra-op Plan:   Post-operative Plan: Extubation in OR  Informed Consent: I have reviewed the patients History and Physical, chart, labs and discussed the procedure including the risks, benefits and alternatives for the proposed anesthesia with the patient or authorized representative who has indicated his/her understanding and acceptance.     Dental Advisory Given  Plan Discussed with: Anesthesiologist, CRNA and Surgeon  Anesthesia Plan Comments: (Patient consented for risks of anesthesia including but not limited to:  - adverse reactions to medications - damage to eyes, teeth, lips or other oral mucosa - nerve damage due to positioning  - sore throat or hoarseness - Damage to heart, brain, nerves, lungs, other parts of body or loss of life  Patient voiced understanding.)        Anesthesia Quick Evaluation

## 2021-08-07 NOTE — Anesthesia Procedure Notes (Signed)
Procedure Name: Intubation Date/Time: 08/07/2021 3:49 PM Performed by: Kelton Pillar, CRNA Pre-anesthesia Checklist: Patient identified, Emergency Drugs available, Suction available and Patient being monitored Patient Re-evaluated:Patient Re-evaluated prior to induction Oxygen Delivery Method: Circle system utilized Preoxygenation: Pre-oxygenation with 100% oxygen Induction Type: IV induction Ventilation: Mask ventilation without difficulty Laryngoscope Size: McGraph and 3 Grade View: Grade I Tube type: Oral Number of attempts: 1 Airway Equipment and Method: Stylet and Oral airway Placement Confirmation: ETT inserted through vocal cords under direct vision, positive ETCO2, breath sounds checked- equal and bilateral and CO2 detector Secured at: 21 cm Tube secured with: Tape Dental Injury: Teeth and Oropharynx as per pre-operative assessment

## 2021-08-07 NOTE — Anesthesia Postprocedure Evaluation (Signed)
Anesthesia Post Note  Patient: Barbara Arellano  Procedure(s) Performed: XI ROBOTIC ASSISTED LAPAROSCOPIC CHOLECYSTECTOMY INDOCYANINE GREEN FLUORESCENCE IMAGING (ICG)  Patient location during evaluation: PACU Anesthesia Type: General Level of consciousness: awake and alert Pain management: pain level controlled Vital Signs Assessment: post-procedure vital signs reviewed and stable Respiratory status: spontaneous breathing, nonlabored ventilation, respiratory function stable and patient connected to nasal cannula oxygen Cardiovascular status: blood pressure returned to baseline and stable Postop Assessment: no apparent nausea or vomiting Anesthetic complications: no   No notable events documented.   Last Vitals:  Vitals:   08/07/21 1830 08/07/21 1852  BP: 128/81 130/85  Pulse: 61 71  Resp: (!) 9 17  Temp:  (!) 36.3 C  SpO2: 96% 100%    Last Pain:  Vitals:   08/07/21 1852  TempSrc: Temporal  PainSc: 4                  Martha Clan

## 2021-08-07 NOTE — Interval H&P Note (Signed)
History and Physical Interval Note:  08/07/2021 3:17 PM  Barbara Arellano  has presented today for surgery, with the diagnosis of chronic calculous cholecystitis.  The various methods of treatment have been discussed with the patient and family. After consideration of risks, benefits and other options for treatment, the patient has consented to  Procedure(s): XI ROBOTIC ASSISTED LAPAROSCOPIC CHOLECYSTECTOMY (N/A) [place] (ICG) (N/A) as a surgical intervention.  The patient's history has been reviewed, patient examined, no change in status, stable for surgery.  I have reviewed the patient's chart and labs.  Questions were answered to the patient's satisfaction.     Ronny Bacon

## 2021-08-11 LAB — SURGICAL PATHOLOGY

## 2021-08-12 IMAGING — CT CT ABD-PELV W/ CM
2 of 5 series · 16 of 46 positions shown, 18 images · IV contrast (APPLIED)
Comparison: 09/21/2017

CLINICAL DATA: Right side abdominal pain

EXAM:
CT ABDOMEN AND PELVIS WITH CONTRAST
TECHNIQUE: Multidetector CT imaging of the abdomen and pelvis was performed
using the standard protocol following bolus administration of
intravenous contrast.
CONTRAST:  100mL OMNIPAQUE IOHEXOL 300 MG/ML  SOLN

[Series 2: routine abd/pel with · axial · 0.75mm/px · z∈[-816,-391]mm · 13 of 96 slices shown, 15 images]
[im 6/96  soft-tissue]
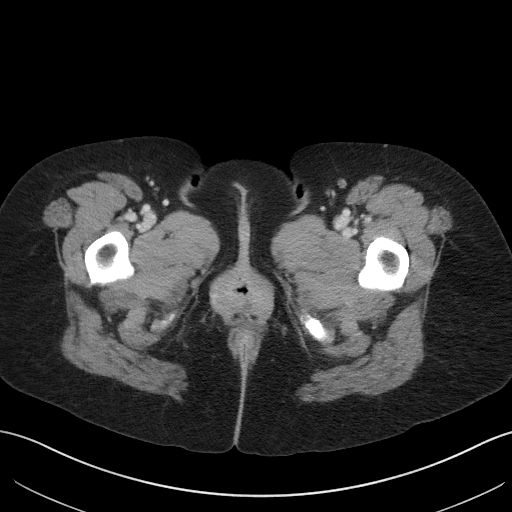
[im 6/96  bone]
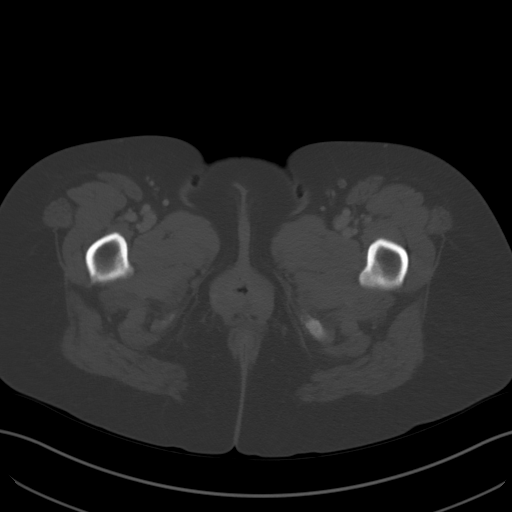
[im 16/96  soft-tissue]
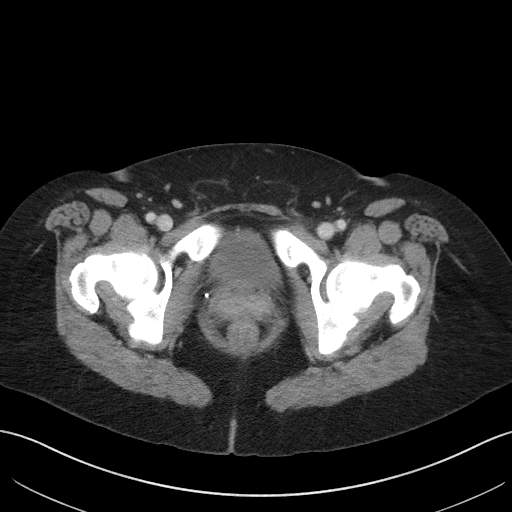
[im 21/96  soft-tissue]
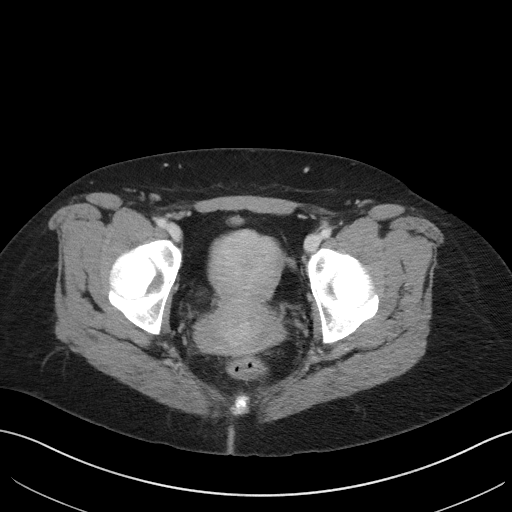
[im 26/96  soft-tissue]
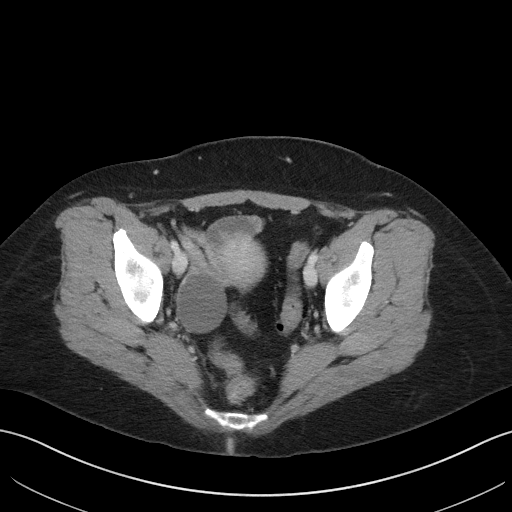
[im 36/96  soft-tissue]
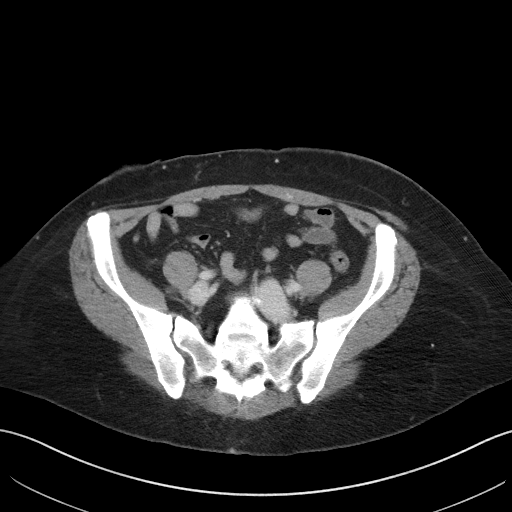
[im 41/96  soft-tissue]
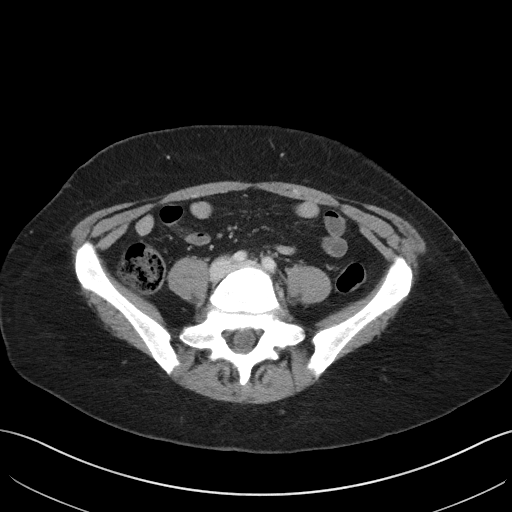
[im 51/96  soft-tissue]
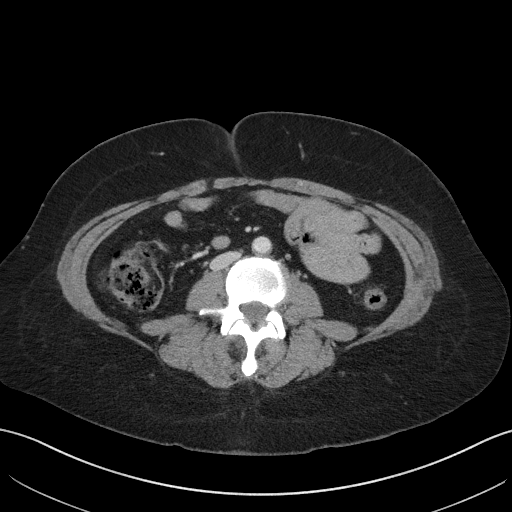
[im 56/96  soft-tissue]
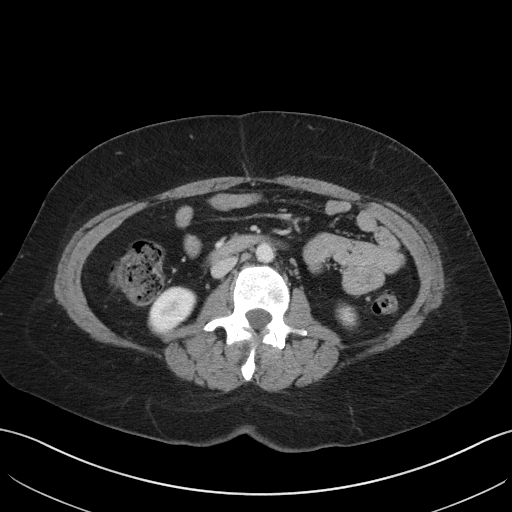
[im 61/96  soft-tissue]
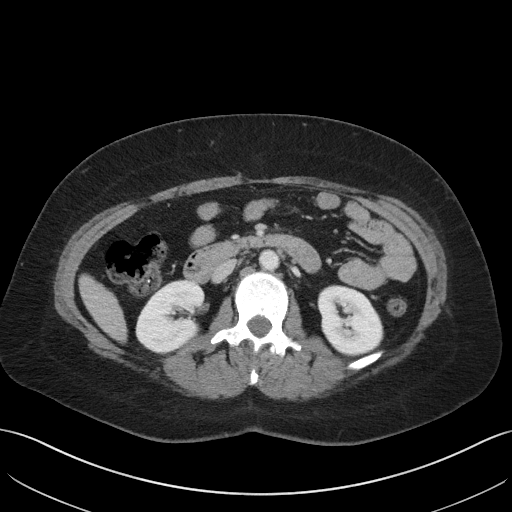
[im 61/96  bone]
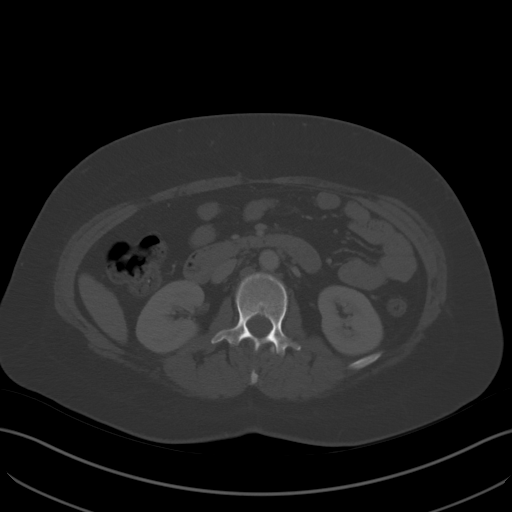
[im 71/96  soft-tissue]
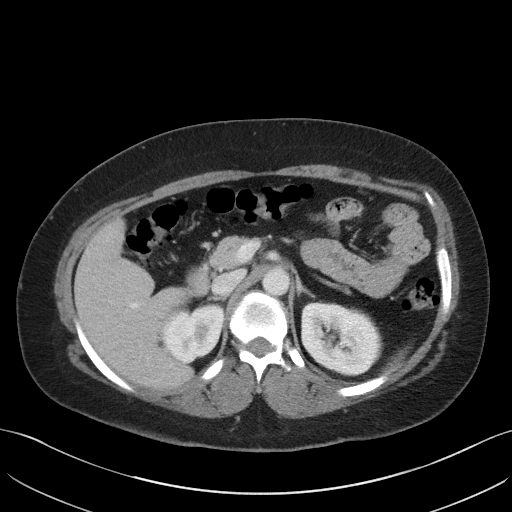
[im 76/96  soft-tissue]
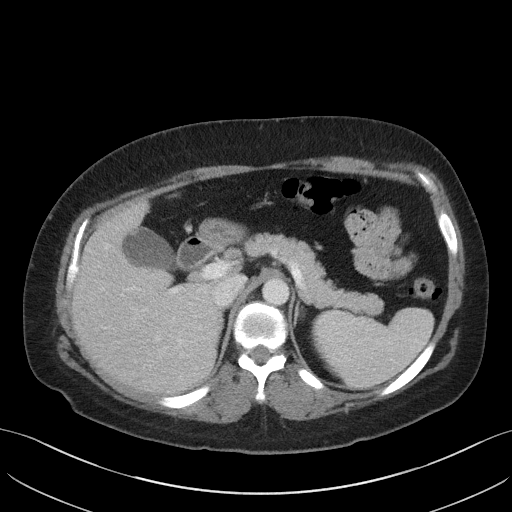
[im 81/96  soft-tissue]
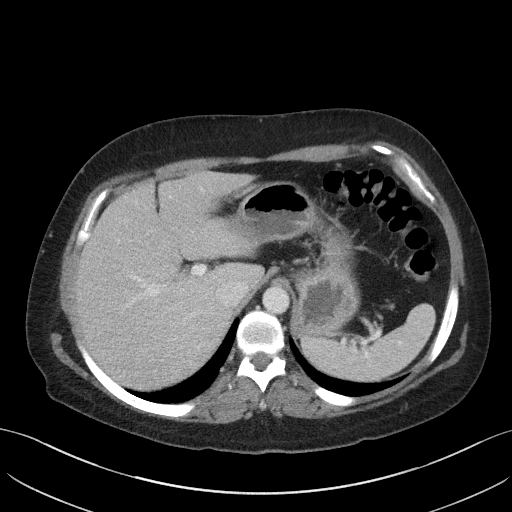
[im 91/96  soft-tissue]
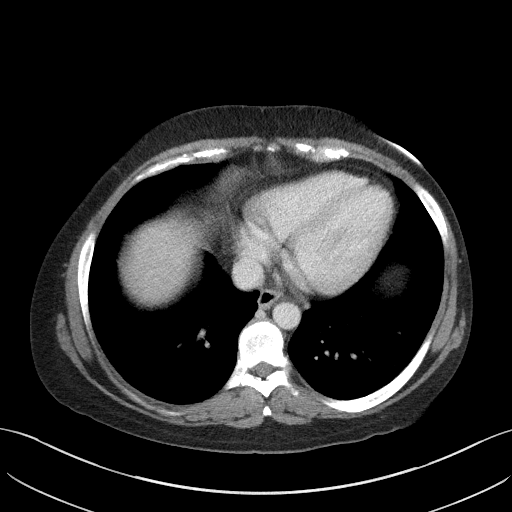

[Series 5: coronal st · coronal · 0.67mm/px · 3 of 83 slices shown]
[im 28/83  soft-tissue]
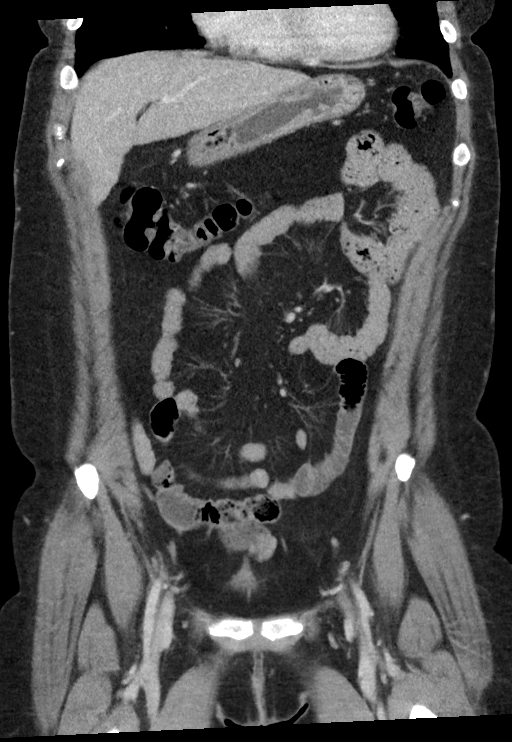
[im 37/83  soft-tissue]
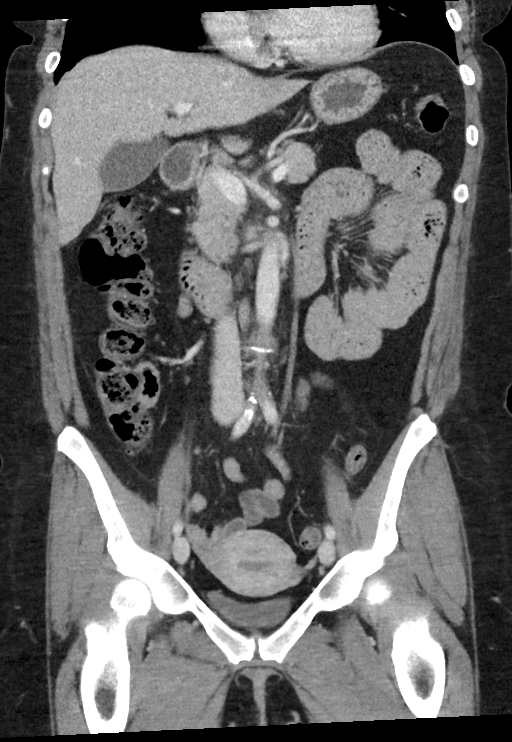
[im 46/83  soft-tissue]
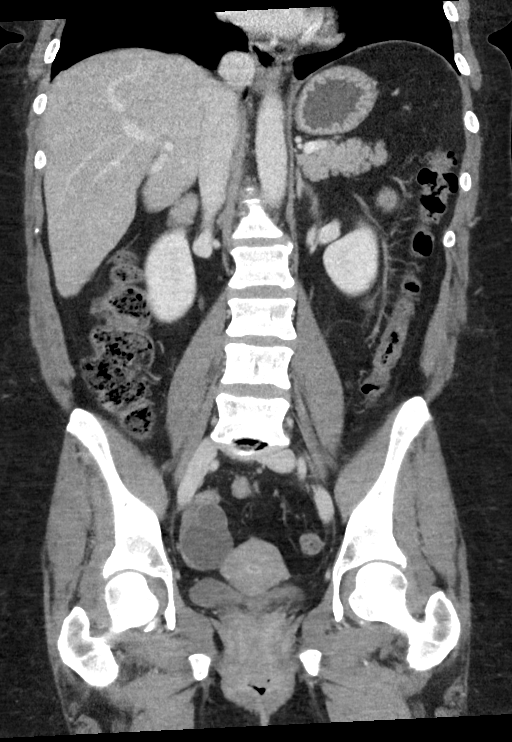

[16 of 46 positions shown; findings below may reference images not displayed]

FINDINGS: Lower chest: Lung bases are clear. No effusions. Heart is normal
size.

Hepatobiliary: No focal hepatic abnormality. Gallbladder
unremarkable.

Pancreas: No focal abnormality or ductal dilatation.

Spleen: No focal abnormality.  Normal size.

Adrenals/Urinary Tract: No adrenal abnormality. No focal renal
abnormality. No stones or hydronephrosis. Urinary bladder is
unremarkable.

Stomach/Bowel: Normal appendix. Stomach, large and small bowel
grossly unremarkable.

Vascular/Lymphatic: Noncalcified plaque in the distal aorta.
Calcifications in the common iliac arteries. No aneurysm or
adenopathy.

Reproductive: Uterus and left adnexa unremarkable. 4.4 cm cyst in
the right ovary.

Other: No free fluid or free air.

Musculoskeletal: No acute bony abnormality.
IMPRESSION: 4.4 cm right ovarian cyst. No follow-up imaging recommended. Note:
This recommendation does not apply to premenarchal patients and to
those with increased risk (genetic, family history, elevated tumor
markers or other high-risk factors) of ovarian cancer. Reference:
JACR [DATE]):[id number]

Normal appendix.

## 2021-08-18 ENCOUNTER — Ambulatory Visit (INDEPENDENT_AMBULATORY_CARE_PROVIDER_SITE_OTHER): Payer: Self-pay | Admitting: Surgery

## 2021-08-18 ENCOUNTER — Encounter: Payer: Self-pay | Admitting: Surgery

## 2021-08-18 ENCOUNTER — Other Ambulatory Visit: Payer: Self-pay

## 2021-08-18 VITALS — BP 116/82 | HR 97 | Temp 98.3°F | Ht 64.0 in | Wt 160.0 lb

## 2021-08-18 DIAGNOSIS — Z09 Encounter for follow-up examination after completed treatment for conditions other than malignant neoplasm: Secondary | ICD-10-CM

## 2021-08-18 DIAGNOSIS — N939 Abnormal uterine and vaginal bleeding, unspecified: Secondary | ICD-10-CM

## 2021-08-18 DIAGNOSIS — Z9049 Acquired absence of other specified parts of digestive tract: Secondary | ICD-10-CM

## 2021-08-18 NOTE — Patient Instructions (Addendum)
We have sent a referral for a GYN consultation. They will call you to schedule this.  You may return to work on Monday with lifting restrictions.   GENERAL POST-OPERATIVE PATIENT INSTRUCTIONS   WOUND CARE INSTRUCTIONS:  Keep a dry clean dressing on the wound if there is drainage. The initial bandage may be removed after 24 hours.  Once the wound has quit draining you may leave it open to air.  If clothing rubs against the wound or causes irritation and the wound is not draining you may cover it with a dry dressing during the daytime.  Try to keep the wound dry and avoid ointments on the wound unless directed to do so.  If the wound becomes bright red and painful or starts to drain infected material that is not clear, please contact your physician immediately.  If the wound is mildly pink and has a thick firm ridge underneath it, this is normal, and is referred to as a healing ridge.  This will resolve over the next 4-6 weeks.  BATHING: You may shower if you have been informed of this by your surgeon. However, Please do not submerge in a tub, hot tub, or pool until incisions are completely sealed or have been told by your surgeon that you may do so.  DIET:  You may eat any foods that you can tolerate.  It is a good idea to eat a high fiber diet and take in plenty of fluids to prevent constipation.  If you do become constipated you may want to take a mild laxative or take ducolax tablets on a daily basis until your bowel habits are regular.  Constipation can be very uncomfortable, along with straining, after recent surgery.  ACTIVITY:  You are encouraged to cough and deep breath or use your incentive spirometer if you were given one, every 15-30 minutes when awake.  This will help prevent respiratory complications and low grade fevers post-operatively if you had a general anesthetic.  You may want to hug a pillow when coughing and sneezing to add additional support to the surgical area, if you had  abdominal or chest surgery, which will decrease pain during these times.  You are encouraged to walk and engage in light activity for the next two weeks.  You should not lift more than 20 pounds for 4 weeks after surgery as it could put you at increased risk for complications.  Twenty pounds is roughly equivalent to a plastic bag of groceries. At that time- Listen to your body when lifting, if you have pain when lifting, stop and then try again in a few days. Soreness after doing exercises or activities of daily living is normal as you get back in to your normal routine.  MEDICATIONS:  Try to take narcotic medications and anti-inflammatory medications, such as tylenol, ibuprofen, naprosyn, etc., with food.  This will minimize stomach upset from the medication.  Should you develop nausea and vomiting from the pain medication, or develop a rash, please discontinue the medication and contact your physician.  You should not drive, make important decisions, or operate machinery when taking narcotic pain medication.  SUNBLOCK Use sun block to incision area over the next year if this area will be exposed to sun. This helps decrease scarring and will allow you avoid a permanent darkened area over your incision.  QUESTIONS:  Please feel free to call our office if you have any questions, and we will be glad to assist you.

## 2021-08-18 NOTE — Progress Notes (Signed)
[facility] POST-OP OFFICE VISIT  08/18/2021  HPI: Barbara Arellano is a 46 y.o. female 11 days s/p robotic cholecystectomy.  She seems to continue to have some hypersensitivity of her periumbilical incision..  We discussed the role of fascial sutures completed at that location which might make it more tender than other areas.  She also reports she began having vaginal bleeding postop.  She reports its been more than a year since she last menstruated.  She denies fevers and chills.  She denies any other abdominal pain.  Vital signs: LMP 02/22/2020 (Approximate)    Physical Exam: Constitutional: She appears well Abdomen: Soft and nontender with exception of hypersensitivity around the periumbilical area. Skin: Her incisions appear clean, dry and intact.  Assessment/Plan: This is a 46 y.o. female 11 days s/p robotic cholecystectomy.  Patient Active Problem List   Diagnosis Date Noted   CCC (chronic calculous cholecystitis) 08/04/2021   Right lower quadrant pain 03/25/2020   Right ovarian cyst 03/20/2020   Vaginal bleeding 12/05/2017   Iron deficiency anemia due to chronic blood loss 12/05/2017    -We will obtain GYN opinion regarding her recurrent vaginal bleeding, hopefully this is just part of the irregularity of perimenopause.  Also hoping that her tenderness about the periumbilical area will improve with time, we will be glad to see her back in about a month giving her the opportunity to finish the healing process.  We will be glad to see her and remain readily available should any additional concerns arise or she not trend toward improvement. I believe she understands we have encouraged her to utilize over-the-counter pain medications to assist with the incisional pain.   Ronny Bacon M.D., FACS 08/18/2021, 2:02 PM

## 2021-08-19 DIAGNOSIS — Z9049 Acquired absence of other specified parts of digestive tract: Secondary | ICD-10-CM | POA: Insufficient documentation

## 2021-10-06 ENCOUNTER — Encounter: Payer: Self-pay | Admitting: Obstetrics and Gynecology

## 2021-10-06 DIAGNOSIS — N939 Abnormal uterine and vaginal bleeding, unspecified: Secondary | ICD-10-CM

## 2021-10-26 ENCOUNTER — Emergency Department
Admission: EM | Admit: 2021-10-26 | Discharge: 2021-10-26 | Disposition: A | Discharge status: Home / Self Care | Payer: Self-pay | Attending: Emergency Medicine | Admitting: Emergency Medicine

## 2021-10-26 ENCOUNTER — Other Ambulatory Visit: Payer: Self-pay

## 2021-10-26 ENCOUNTER — Emergency Department: Payer: Self-pay

## 2021-10-26 DIAGNOSIS — M7542 Impingement syndrome of left shoulder: Secondary | ICD-10-CM | POA: Insufficient documentation

## 2021-10-26 DIAGNOSIS — M25812 Other specified joint disorders, left shoulder: Secondary | ICD-10-CM

## 2021-10-26 MED ORDER — MELOXICAM 15 MG PO TABS: 15.0000 mg | ORAL_TABLET | Freq: Every day | ORAL | 0 refills | Status: DC

## 2021-10-26 MED ORDER — HYDROCODONE-ACETAMINOPHEN 5-325 MG PO TABS
1.0000 | ORAL_TABLET | Freq: Once | ORAL | Status: AC
  Administered 2021-10-26: 1 via ORAL
  Filled 2021-10-26: qty 1

## 2021-10-26 MED ORDER — HYDROCODONE-ACETAMINOPHEN 5-325 MG PO TABS: 1.0000 | ORAL_TABLET | ORAL | 0 refills | Status: DC | PRN

## 2021-10-26 NOTE — ED Triage Notes (Signed)
Pt to ED via POV from work. Pt reports left arm pain that has gotten worse. Pt denies injury. Pt reports hx left shoulder pinched nerve.  ?

## 2021-10-26 NOTE — ED Provider Notes (Signed)
? ?[facility] ?Provider Note ? ?Patient Contact: 5:09 PM (approximate) ? ? ?History  ? ?Extremity Pain (Left arm pain ) ? ? ?HPI ? ?Barbara NORMENT is a 46 y.o. female who presents to the emergency department complaining of worsening left arm pain.  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  Patient has been seen by neurosurgery, had multiple rounds of injections and surgery as a last option at this time.  Patient states that she was lifting a 5 gallon containers of TV earlier today when she felt increased pain in her left biceps region.  No direct trauma.  No other complaints at this time. ?  ? ? ?Physical Exam  ? ?Triage Vital Signs: ?ED Triage Vitals [10/26/21 1507]  ?Enc Vitals Group  ?   BP (!) 139/97  ?   Pulse Rate 66  ?   Resp 18  ?   Temp 98.2 ?F (36.8 ?C)  ?   Temp Source Oral  ?   SpO2 95 %  ?   Weight 160 lb (72.6 kg)  ?   Height '5\' 4"'$  (1.626 m)  ?   Head Circumference   ?   Peak Flow   ?   Pain Score 10  ?   Pain Loc   ?   Pain Edu?   ?   Excl. in [place]?   ? ? ?Most recent vital signs: ?Vitals:  ? 10/26/21 1507  ?BP: (!) 139/97  ?Pulse: 66  ?Resp: 18  ?Temp: 98.2 ?F (36.8 ?C)  ?SpO2: 95%  ? ? ? ?General: Alert and in no acute distress.  ?Neck: No stridor.  Diffuse left-sided paraspinal cervical spine tenderness to palpation.  ?Cardiovascular:  Good peripheral perfusion ?Respiratory: Normal respiratory effort without tachypnea or retractions. Lungs CTAB. ?Musculoskeletal: Full range of motion to all extremities.  Visualization of the left upper extremity reveals no obvious signs of trauma.  No Popeye deformity.  Patient had tenderness over the proximal biceps extending into the anterior shoulder.  Positive for tenderness over the [facility] joint.  Limited range of motion due to pain.  Radial pulses sensation intact distally. ?Neurologic:  No gross focal neurologic deficits are appreciated.  ?Skin:   No rash  noted ?Other: ? ? ?ED Results / Procedures / Treatments  ? ?Labs ?(all labs ordered are listed, but only abnormal results are displayed) ?Labs Reviewed - No data to display ? ? ?EKG ? ? ? ? ?RADIOLOGY ? ?I personally viewed and evaluated these images as part of my medical decision making, as well as reviewing the written report by the radiologist. ? ?ED Provider Interpretation: Chronic degenerative changes at C5-C6 and C6-C7.  No acute traumatic findings to the shoulder or cervical spine. ? ?DG Cervical Spine 2-3 Views ? ?Result Date: 10/26/2021 ?CLINICAL DATA:  Pain left neck and left shoulder EXAM: CERVICAL SPINE - 3 VIEW COMPARISON:  01/20/2018 FINDINGS: No recent fracture is seen. Alignment of posterior margins of vertebral bodies is unremarkable. Degenerative changes are noted with bony spurs and disc space narrowing at C5-C6 and C6-C7 levels. Evaluation of neural foramina is limited without oblique views. In the AP view, there is evidence of encroachment of neural foramina at C5-C6 and C6-C7 levels more so on the right side. Prevertebral soft tissues are unremarkable. IMPRESSION: No recent fracture is seen. 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 Electronically Signed   By:  Elmer Picker M.D.   On: 10/26/2021 17:45  ? ?DG Shoulder Left ? ?Result Date: 10/26/2021 ?CLINICAL DATA:  Pain EXAM: LEFT SHOULDER - 2+ VIEW COMPARISON:  01/15/2021 FINDINGS: There is no evidence of fracture or dislocation. There is no evidence of arthropathy or other focal bone abnormality. Soft tissues are unremarkable. IMPRESSION: No radiographic abnormality is seen in the left shoulder. Electronically Signed   By: Elmer Picker M.D.   On: 10/26/2021 17:46   ? ?PROCEDURES: ? ?Critical Care performed: No ? ?Procedures ? ? ?MEDICATIONS ORDERED IN ED: ?Medications  ?HYDROcodone-acetaminophen (NORCO/VICODIN) 5-325 MG per tablet 1 tablet (1 tablet Oral Given 10/26/21 1720)   ? ? ? ?IMPRESSION / MDM / ASSESSMENT AND PLAN / ED COURSE  ?I reviewed the triage vital signs and the nursing notes. ?             ?               ? ?Differential diagnosis includes, but is not limited to, cervical radiculopathy, impingement syndrome, biceps tear, AC separation, shoulder dislocation ? ? ?Patient's diagnosis is consistent with impingement syndrome of the shoulder.  Patient presented to the emergency department complaining of increased left proximal humerus/shoulder pain.  Patient was lifting heavy items at work.  She has chronic pain from her cervical degeneration.  She has seen neurosurgery in the past but does not have insurance to perform surgery at this time.  Patient had no direct trauma to the arm.  Imaging is reassuring, showing chronic changes.  No concern for biceps rupture/tear at this time.  Findings are concerning for impingement syndrome.  She will be placed on anti-inflammatories and limited pain medication.  Follow-up with neurosurgery for further management of ongoing cervical radiculopathy symptoms.  Return precautions discussed with the patient..  Patient is given ED precautions to return to the ED for any worsening or new symptoms. ? ? ? ?  ? ? ?FINAL CLINICAL IMPRESSION(S) / ED DIAGNOSES  ? ?Final diagnoses:  ?Impingement of left shoulder  ? ? ? ?Rx / DC Orders  ? ?ED Discharge Orders   ? ?      Ordered  ?  meloxicam (MOBIC) 15 MG tablet  Daily       ? 10/26/21 1913  ?  HYDROcodone-acetaminophen (NORCO/VICODIN) 5-325 MG tablet  Every 4 hours PRN       ? 10/26/21 1913  ? ?  ?  ? ?  ? ? ? ?Note:  This document was prepared using Dragon voice recognition software and may include unintentional dictation errors. ?  ?Darletta Moll, PA-C ?10/26/21 1914 ? ?  ?Harvest Dark, MD ?10/26/21 2314 ? ?

## 2022-05-17 ENCOUNTER — Emergency Department
Admission: EM | Admit: 2022-05-17 | Discharge: 2022-05-17 | Disposition: A | Discharge status: Home / Self Care | Payer: 59 | Attending: Emergency Medicine | Admitting: Emergency Medicine

## 2022-05-17 ENCOUNTER — Other Ambulatory Visit: Payer: Self-pay

## 2022-05-17 ENCOUNTER — Emergency Department: Payer: 59

## 2022-05-17 DIAGNOSIS — R69 Illness, unspecified: Secondary | ICD-10-CM | POA: Diagnosis not present

## 2022-05-17 DIAGNOSIS — R079 Chest pain, unspecified: Secondary | ICD-10-CM | POA: Diagnosis not present

## 2022-05-17 DIAGNOSIS — R0789 Other chest pain: Secondary | ICD-10-CM | POA: Diagnosis not present

## 2022-05-17 DIAGNOSIS — J40 Bronchitis, not specified as acute or chronic: Secondary | ICD-10-CM | POA: Diagnosis not present

## 2022-05-17 DIAGNOSIS — Z20822 Contact with and (suspected) exposure to covid-19: Secondary | ICD-10-CM | POA: Diagnosis not present

## 2022-05-17 DIAGNOSIS — I959 Hypotension, unspecified: Secondary | ICD-10-CM | POA: Diagnosis not present

## 2022-05-17 DIAGNOSIS — R0902 Hypoxemia: Secondary | ICD-10-CM | POA: Diagnosis not present

## 2022-05-17 DIAGNOSIS — F17200 Nicotine dependence, unspecified, uncomplicated: Secondary | ICD-10-CM | POA: Insufficient documentation

## 2022-05-17 DIAGNOSIS — R069 Unspecified abnormalities of breathing: Secondary | ICD-10-CM | POA: Diagnosis not present

## 2022-05-17 LAB — RESP PANEL BY RT-PCR (FLU A&B, COVID) ARPGX2
Influenza A by PCR: NEGATIVE
Influenza B by PCR: NEGATIVE
SARS Coronavirus 2 by RT PCR: NEGATIVE

## 2022-05-17 LAB — BASIC METABOLIC PANEL
Anion gap: 6 (ref 5–15)
BUN: 19 mg/dL (ref 6–20)
CO2: 25 mmol/L (ref 22–32)
Calcium: 9.3 mg/dL (ref 8.9–10.3)
Chloride: 110 mmol/L (ref 98–111)
Creatinine, Ser: 0.62 mg/dL (ref 0.44–1.00)
GFR, Estimated: >60 mL/min (ref >60)
Glucose, Bld: 96 mg/dL (ref 70–99)
Potassium: 3.6 mmol/L (ref 3.5–5.1)
Sodium: 141 mmol/L (ref 135–145)

## 2022-05-17 LAB — CBC
HCT: 35.5 % — ABNORMAL LOW (ref 36.0–46.0)
Hemoglobin: 12.4 g/dL (ref 12.0–15.0)
MCH: 32.7 pg (ref 26.0–34.0)
MCHC: 34.9 g/dL (ref 30.0–36.0)
MCV: 93.7 fL (ref 80.0–100.0)
Platelets: 212 10*3/uL (ref 150–400)
RBC: 3.79 MIL/uL — ABNORMAL LOW (ref 3.87–5.11)
RDW: 11.6 % (ref 11.5–15.5)
WBC: 8.2 10*3/uL (ref 4.0–10.5)
nRBC: 0 % (ref 0.0–0.2)

## 2022-05-17 LAB — TROPONIN I (HIGH SENSITIVITY)
Troponin I (High Sensitivity): 4 ng/L (ref <18)
Troponin I (High Sensitivity): 5 ng/L (ref <18)

## 2022-05-17 LAB — HEPATIC FUNCTION PANEL
ALT: 15 U/L (ref 0–44)
AST: 25 U/L (ref 15–41)
Albumin: 3.7 g/dL (ref 3.5–5.0)
Alkaline Phosphatase: 86 U/L (ref 38–126)
Bilirubin, Direct: <0.1 mg/dL (ref 0.0–0.2)
Total Bilirubin: 0.6 mg/dL (ref 0.3–1.2)
Total Protein: 6.8 g/dL (ref 6.5–8.1)

## 2022-05-17 LAB — LIPASE, BLOOD: Lipase: 58 U/L — ABNORMAL HIGH (ref 11–51)

## 2022-05-17 MED ORDER — OXYCODONE-ACETAMINOPHEN 5-325 MG PO TABS
1.0000 | ORAL_TABLET | Freq: Once | ORAL | Status: AC
  Administered 2022-05-17: 1 via ORAL
  Filled 2022-05-17: qty 1

## 2022-05-17 MED ORDER — ASPIRIN 81 MG PO CHEW: 324.0000 mg | CHEWABLE_TABLET | Freq: Once | ORAL | Status: DC

## 2022-05-17 MED ORDER — ALUM & MAG HYDROXIDE-SIMETH 200-200-20 MG/5ML PO SUSP
30.0000 mL | Freq: Once | ORAL | Status: AC
  Administered 2022-05-17: 30 mL via ORAL
  Filled 2022-05-17: qty 30

## 2022-05-17 NOTE — ED Triage Notes (Signed)
Pt coming from work via ems w c/o cough and substernal chest pain that radiates to back. Also c/o not being able to clear throat.

## 2022-05-17 NOTE — ED Provider Notes (Signed)
[facility] Provider Note    Event Date/Time   First MD Initiated Contact with Patient 05/17/22 [phone]     (approximate)   History   Chest Pain   HPI  Barbara Arellano is a 46 y.o. female   Past medical history of smoker, self reported emphysema, depression, anxiety, gerd here with chest pressure and cough. Cough for 3 days non productive afebrile which has caused chest pain with coughing only. Then today was opening for work and developed chest tightness substernal non radiating. No dyspnea. No fever/chills or nausea.  Smokes 1/2 pack a day and occasional MJ, no stimulants.  History was obtained via patient and EMS       Physical Exam   Triage Vital Signs: ED Triage Vitals  Enc Vitals Group     BP 05/17/22 0553 (!) 122/100     Pulse Rate 05/17/22 0553 77     Resp --      Temp 05/17/22 0553 97.9 F (36.6 C)     Temp Source 05/17/22 0553 Oral     SpO2 05/17/22 0553 100 %     Weight 05/17/22 0557 158 lb (71.7 kg)     Height 05/17/22 0557 '5\' 4"'$  (1.626 m)     Head Circumference --      Peak Flow --      Pain Score 05/17/22 0556 10     Pain Loc --      Pain Edu? --      Excl. in [place]? --     Most recent vital signs: Vitals:   05/17/22 0553 05/17/22 0600  BP: (!) 122/100 116/81  Pulse: 77 69  Resp:  16  Temp: 97.9 F (36.6 C)   SpO2: 100% 95%    General: Awake, no distress.  CV:  Good peripheral perfusion. Radial intact bilateral equal Resp:  Normal effort. No wheeze no rales no focality, no stridor Abd:  No distention. Non tender in all quads Other:  Non toxic.    ED Results / Procedures / Treatments   Labs (all labs ordered are listed, but only abnormal results are displayed) Labs Reviewed  CBC - Abnormal; Notable for the following components:      Result Value   RBC 3.79 (*)    HCT 35.5 (*)    All other components within normal limits  LIPASE, BLOOD - Abnormal; Notable for the following components:   Lipase 58 (*)    All  other components within normal limits  RESP PANEL BY RT-PCR (FLU A&B, COVID) ARPGX2  BASIC METABOLIC PANEL  HEPATIC FUNCTION PANEL  TROPONIN I (HIGH SENSITIVITY)     I reviewed labs and they are notable for normal WBC 8.9, initial troponin is 4.  EKG  ED ECG REPORT I, Lucillie Garfinkel, the attending physician, personally viewed and interpreted this ECG.   Date: 05/17/2022  EKG Time: 0553  Rate: 79  Rhythm: normal sinus rhythm  Axis: nl  Intervals:none  ST&T Change: No acute ischemic changes    RADIOLOGY I independently reviewed and interpreted chest x-ray and see no obvious pneumothorax   PROCEDURES:  Critical Care performed: No  Procedures   MEDICATIONS ORDERED IN ED: Medications  aspirin chewable tablet 324 mg (324 mg Oral Not Given 05/17/22 0604)     IMPRESSION / MDM / ASSESSMENT AND PLAN / ED COURSE  I reviewed the triage vital signs and the nursing notes.  Differential diagnosis includes, but is not limited to, bronchitis,  ACS, bacterial pneumonia, viral URI, spontaneous pneumothorax, considered but less likely PE, dissection.    MDM: This is a patient with mostly respiratory infectious symptoms, bronchitis, with chest pain mostly associated with coughing but new quality of chest pain starting this morning.  Assess for ACS with troponin which fortunately looks nonischemic and troponins x2 which first is negative.  Check for respiratory infection with chest x-ray, chest x-ray shows no acute consolidation suggestive of bacterial pneumonia.  Check viral swabs.  Fortunately she is stable in the emergency department with no respiratory distress and her normal hemodynamics with no hypoxemia.  This patient was signed out pending her second troponin and remainder of her work-up as above.  If work-up is negative for acute emergent pathologies, she should be amenable for discharge with close PMD follow-up.  Patient's presentation is most  consistent with acute presentation with potential threat to life or bodily function.       FINAL CLINICAL IMPRESSION(S) / ED DIAGNOSES   Final diagnoses:  Bronchitis  Chest pain, unspecified type     Rx / DC Orders   ED Discharge Orders     None        Note:  This document was prepared using Dragon voice recognition software and may include unintentional dictation errors.    Lucillie Garfinkel, MD 05/17/22 [phone]

## 2022-05-17 NOTE — ED Provider Notes (Signed)
Patient received in sign out from Dr. Jacelyn Grip.  Serial enzymes negative.  Patient without hypoxia or respiratory symptoms.  Does appear stable and appropriate for outpatient follow up.   Merlyn Lot, MD 05/17/22 1005

## 2022-05-20 ENCOUNTER — Encounter: Payer: Self-pay | Admitting: Family Medicine

## 2022-05-20 ENCOUNTER — Telehealth: Payer: 59 | Admitting: Family Medicine

## 2022-05-20 NOTE — Progress Notes (Signed)
The patient no-showed for appointment despite this provider sending direct link, reaching out via phone with no response and waiting for at least 10 minutes from appointment time for patient to join. They will be marked as a NS for this appointment/time.   Eboni Coval M Jericka Kadar, NP    

## 2022-07-03 ENCOUNTER — Encounter: Payer: Self-pay | Admitting: Emergency Medicine

## 2022-07-03 ENCOUNTER — Other Ambulatory Visit: Payer: Self-pay

## 2022-07-03 ENCOUNTER — Emergency Department
Admission: EM | Admit: 2022-07-03 | Discharge: 2022-07-03 | Disposition: A | Discharge status: Home / Self Care | Payer: 59 | Attending: Emergency Medicine | Admitting: Emergency Medicine

## 2022-07-03 ENCOUNTER — Emergency Department: Payer: 59

## 2022-07-03 DIAGNOSIS — S8992XA Unspecified injury of left lower leg, initial encounter: Secondary | ICD-10-CM | POA: Diagnosis not present

## 2022-07-03 DIAGNOSIS — W010XXA Fall on same level from slipping, tripping and stumbling without subsequent striking against object, initial encounter: Secondary | ICD-10-CM | POA: Insufficient documentation

## 2022-07-03 DIAGNOSIS — S8002XA Contusion of left knee, initial encounter: Secondary | ICD-10-CM

## 2022-07-03 DIAGNOSIS — M25562 Pain in left knee: Secondary | ICD-10-CM | POA: Diagnosis not present

## 2022-07-03 DIAGNOSIS — Y990 Civilian activity done for income or pay: Secondary | ICD-10-CM | POA: Insufficient documentation

## 2022-07-03 NOTE — ED Provider Notes (Signed)
[facility] Provider Note    Event Date/Time   First MD Initiated Contact with Patient 07/03/22 1016     (approximate)   History   Knee Pain   HPI  Barbara Arellano is a 46 y.o. female who presents today for evaluation of left knee pain.  Patient reports that she had a mechanical trip and fall while carrying a box of donuts and landed directly on her left knee.  She denies head strike or LOC.  She reports that she has had pain with straightening her leg fully understands that she is unable to.  She denies numbness or tingling.  No other injury sustained.  Patient Active Problem List   Diagnosis Date Noted   Status post laparoscopic cholecystectomy 08/19/2021   Right lower quadrant pain 03/25/2020   Right ovarian cyst 03/20/2020   Vaginal bleeding 12/05/2017   Iron deficiency anemia due to chronic blood loss 12/05/2017          Physical Exam   Triage Vital Signs: ED Triage Vitals [07/03/22 0957]  Enc Vitals Group     BP 118/87     Pulse Rate 78     Resp 18     Temp 98 F (36.7 C)     Temp Source Oral     SpO2 98 %     Weight 158 lb 1.1 oz (71.7 kg)     Height '5\' 4"'$  (1.626 m)     Head Circumference      Peak Flow      Pain Score 10     Pain Loc      Pain Edu?      Excl. in [place]?     Most recent vital signs: Vitals:   07/03/22 0957  BP: 118/87  Pulse: 78  Resp: 18  Temp: 98 F (36.7 C)  SpO2: 98%    Physical Exam Vitals and nursing note reviewed.  Constitutional:      General: Awake and alert. No acute distress.    Appearance: Normal appearance. The patient is normal weight.  HENT:     Head: Normocephalic and atraumatic.     Mouth: Mucous membranes are moist.  Eyes:     General: PERRL. Normal EOMs        Right eye: No discharge.        Left eye: No discharge.     Conjunctiva/sclera: Conjunctivae normal.  Cardiovascular:     Rate and Rhythm: Normal rate and regular rhythm.     Pulses: Normal pulses.     Heart sounds:  Normal heart sounds Pulmonary:     Effort: Pulmonary effort is normal. No respiratory distress.     Breath sounds: Normal breath sounds.  Abdominal:     Abdomen is soft. There is no abdominal tenderness. No rebound or guarding. No distention. Musculoskeletal:        General: No swelling. Normal range of motion.     Cervical back: Normal range of motion and neck supple.  Left knee: No deformity.  Very superficial abrasion noted to left anterior knee with mild tenderness in this location.  No joint line tenderness. No patellar tenderness, no ballotment Warm and well perfused extremity with 2+ pedal pulses 5/5 strength to dorsiflexion and plantarflexion at the ankle with intact sensation throughout extremity Normal range of motion of the knee, with intact flexion and extension to active and passive range of motion. Extensor mechanism intact. No ligamentous laxity. Negative anterior/posterior drawer/negative lachman, negative mcmurrays  No effusion or warmth Intact quadriceps, hamstring function, patellar tendon function Pelvis stable Full ROM of ankle without pain or swelling Foot warm and well perfused Skin:    General: Skin is warm and dry.     Capillary Refill: Capillary refill takes less than 2 seconds.     Findings: No rash.  Neurological:     Mental Status: The patient is awake and alert.      ED Results / Procedures / Treatments   Labs (all labs ordered are listed, but only abnormal results are displayed) Labs Reviewed - No data to display   EKG     RADIOLOGY I independently reviewed and interpreted imaging and agree with radiologists findings.     PROCEDURES:  Critical Care performed:   Procedures   MEDICATIONS ORDERED IN ED: Medications - No data to display   IMPRESSION / MDM / [place] / ED COURSE  I reviewed the triage vital signs and the nursing notes.   Differential diagnosis includes, but is not limited to, contusion, fracture, less  likely ligamental injury or meniscus injury.  Patient is awake and alert, hemodynamically stable and neurovascularly intact.  No obvious deformity.  Sensation intact distally.  No obvious effusion.  Extensor mechanism is intact.  X-ray obtained is negative for fracture or dislocation.  Patient was reassured by these findings.  She was given a knee splint for extra support and instructed to remove this at night to reduce her risk for DVT.  We also discussed the importance of rest, ice, and elevation.  We discussed return precautions and outpatient follow-up.  Patient understands and agrees with plan.  She was given a work note as requested. She is able to bear weight but requested crutches so she can lean on them while working. These were provided for her.   Patient's presentation is most consistent with acute complicated illness / injury requiring diagnostic workup.    FINAL CLINICAL IMPRESSION(S) / ED DIAGNOSES   Final diagnoses:  Contusion of left knee, initial encounter     Rx / DC Orders   ED Discharge Orders     None        Note:  This document was prepared using Dragon voice recognition software and may include unintentional dictation errors.   Marquette Old, PA-C 07/03/22 1128    Nathaniel Man, MD 07/03/22 1534

## 2022-07-03 NOTE — Discharge Instructions (Signed)
Your x-ray is normal.  You may continue to rest, ice, elevate your knee.  You may wear the brace while you are walking around, please remove it at night.  Please return for any new, worsening, or change in symptoms or other concerns.  It was a pleasure caring for you today.

## 2022-07-03 NOTE — ED Triage Notes (Signed)
Pt here with left knee pain after falling at work and tripped over the mat. Pt has not been able to straighten her leg since. Pt knee is red and swollen.

## 2022-07-28 DIAGNOSIS — M25512 Pain in left shoulder: Secondary | ICD-10-CM | POA: Diagnosis not present

## 2022-07-28 DIAGNOSIS — R079 Chest pain, unspecified: Secondary | ICD-10-CM | POA: Diagnosis not present

## 2022-08-10 ENCOUNTER — Emergency Department
Admission: EM | Admit: 2022-08-10 | Discharge: 2022-08-10 | Discharge status: Against Medical Advice | Payer: 59 | Attending: Emergency Medicine | Admitting: Emergency Medicine

## 2022-08-10 DIAGNOSIS — Z5321 Procedure and treatment not carried out due to patient leaving prior to being seen by health care provider: Secondary | ICD-10-CM | POA: Diagnosis not present

## 2022-08-10 DIAGNOSIS — M545 Low back pain, unspecified: Secondary | ICD-10-CM | POA: Diagnosis not present

## 2022-08-10 NOTE — ED Notes (Signed)
No answer when called several times from lobby 

## 2022-08-11 ENCOUNTER — Emergency Department
Admission: EM | Admit: 2022-08-11 | Discharge: 2022-08-11 | Disposition: A | Discharge status: Home / Self Care | Payer: 59 | Attending: Emergency Medicine | Admitting: Emergency Medicine

## 2022-08-11 ENCOUNTER — Other Ambulatory Visit: Payer: Self-pay

## 2022-08-11 ENCOUNTER — Encounter: Payer: Self-pay | Admitting: Emergency Medicine

## 2022-08-11 ENCOUNTER — Emergency Department: Payer: 59

## 2022-08-11 DIAGNOSIS — M5442 Lumbago with sciatica, left side: Secondary | ICD-10-CM | POA: Diagnosis not present

## 2022-08-11 DIAGNOSIS — M5440 Lumbago with sciatica, unspecified side: Secondary | ICD-10-CM | POA: Diagnosis not present

## 2022-08-11 DIAGNOSIS — M4317 Spondylolisthesis, lumbosacral region: Secondary | ICD-10-CM

## 2022-08-11 DIAGNOSIS — M545 Low back pain, unspecified: Secondary | ICD-10-CM | POA: Diagnosis not present

## 2022-08-11 MED ORDER — KETOROLAC TROMETHAMINE 30 MG/ML IJ SOLN
30.0000 mg | Freq: Once | INTRAMUSCULAR | Status: AC
  Administered 2022-08-11: 30 mg via INTRAMUSCULAR
  Filled 2022-08-11: qty 1

## 2022-08-11 MED ORDER — LIDOCAINE 5 % EX PTCH: 1.0000 | MEDICATED_PATCH | Freq: Two times a day (BID) | CUTANEOUS | 0 refills | Status: AC | PRN

## 2022-08-11 MED ORDER — TIZANIDINE HCL 2 MG PO TABS: 2.0000 mg | ORAL_TABLET | Freq: Three times a day (TID) | ORAL | 1 refills | Status: AC | PRN

## 2022-08-11 MED ORDER — PREDNISONE 20 MG PO TABS: 40.0000 mg | ORAL_TABLET | Freq: Every day | ORAL | 0 refills | Status: AC

## 2022-08-11 MED ORDER — PREGABALIN 75 MG PO CAPS: 75.0000 mg | ORAL_CAPSULE | Freq: Two times a day (BID) | ORAL | 0 refills | Status: AC

## 2022-08-11 NOTE — ED Provider Notes (Signed)
[facility] Provider Note    Event Date/Time   First MD Initiated Contact with Patient 08/11/22 1414     (approximate)   History   Back Pain   HPI  Barbara Arellano is a 47 y.o. female   presents to the ED with complaint of low back pain.  Patient states that started Monday after she had done some housework around and then sat down for approximate 20 minutes.  Patient states that when she got up she was unable to bend completely over and also noticed that she had some radiculopathy into both legs.  She denies any urinary symptoms, incontinence of bowel or bladder.  Patient has continued to ambulate and actually drove herself to the emergency department.  She has taken over-the-counter medications and a few Flexeril that was leftover from a shoulder issue without any relief.  She was seen at [facility] yesterday at which time her urinalysis was negative and she was told to come to the emergency department for further evaluation.      Physical Exam   Triage Vital Signs: ED Triage Vitals  Enc Vitals Group     BP 08/11/22 1337 (!) 145/88     Pulse Rate 08/11/22 1337 99     Resp 08/11/22 1337 18     Temp 08/11/22 1337 98.1 F (36.7 C)     Temp Source 08/11/22 1337 Oral     SpO2 08/11/22 1337 96 %     Weight --      Height --      Head Circumference --      Peak Flow --      Pain Score 08/11/22 1338 10     Pain Loc --      Pain Edu? --      Excl. in [place]? --     Most recent vital signs: Vitals:   08/11/22 1337  BP: (!) 145/88  Pulse: 99  Resp: 18  Temp: 98.1 F (36.7 C)  SpO2: 96%     General: Awake, no distress.  CV:  Good peripheral perfusion.  Resp:  Normal effort.  Abd:  No distention.  Other:  On examination of the back there is no gross deformity however there is moderate tenderness on palpation of the L5-S1 area and paravertebral muscles bilaterally.  Patient currently is sitting on her knees as she states this is comfortable for  her.   ED Results / Procedures / Treatments   Labs (all labs ordered are listed, but only abnormal results are displayed) Labs Reviewed - No data to display    RADIOLOGY Right images were reviewed by myself independent of the radiologist and scoliosis was noted but no compression fractures noted.    PROCEDURES:  Critical Care performed:   Procedures   MEDICATIONS ORDERED IN ED: Medications  ketorolac (TORADOL) 30 MG/ML injection 30 mg (30 mg Intramuscular Given 08/11/22 1432)     IMPRESSION / MDM / ASSESSMENT AND PLAN / ED COURSE  I reviewed the triage vital signs and the nursing notes.   Differential diagnosis includes, but is not limited to, low back pain with radiculopathy, compression fracture, muscle skeletal pain, lumbar strain.  47 year old female presents to the ED with a nontraumatic low back pain with radiculopathy bilaterally.  Patient was given Toradol 30 mg IM in the ED and x-ray images were ordered with no acute bony injury noted.  The remainder of this patient's care is being turned over to [employer],  PA-C.      Patient's presentation is most consistent with acute complicated illness / injury requiring diagnostic workup.  FINAL CLINICAL IMPRESSION(S) / ED DIAGNOSES   Final diagnoses:  Low back pain with sciatica, sciatica laterality unspecified, unspecified back pain laterality, unspecified chronicity     Rx / DC Orders   ED Discharge Orders     None        Note:  This document was prepared using Dragon voice recognition software and may include unintentional dictation errors.   Johnn Hai, PA-C 08/11/22 1503    Lavonia Drafts, MD 08/11/22 [phone]

## 2022-08-11 NOTE — Discharge Instructions (Addendum)
Your exam is consistent with degenerative disc disease and severe arthritis. Take the prescription meds as directed. Follow-up with your provider for further management. Return to the ED as needed.

## 2022-08-11 NOTE — ED Provider Notes (Signed)
----------------------------------------- 3:30 PM on 08/11/2022 -----------------------------------------  Blood pressure (!) 145/88, pulse 99, temperature 98.1 F (36.7 C), temperature source Oral, resp. rate 18, last menstrual period 02/22/2020, SpO2 96 %.  Assuming care from Dr. Letitia Neri, PA-C/NP-C.  In short, Barbara Arellano is a 47 y.o. female with a chief complaint of Back Pain .  Refer to the original H&P for additional details.  The current plan of care is to await XR results and dispo accordingly.  ____________________________________________    ED Results / Procedures / Treatments   Labs (all labs ordered are listed, but only abnormal results are displayed) Labs Reviewed - No data to display   EKG   RADIOLOGY  I personally viewed and evaluated these images as part of my medical decision making, as well as reviewing the written report by the radiologist.  ED Provider Interpretation: chronic DDD/spondylolisthesis noted  DG Lumbar Spine 2-3 Views  Result Date: 08/11/2022 CLINICAL DATA:  Low back pain.  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 Chronic bilateral L5 pars interarticularis defects with grade 2 anterolisthesis of L5 on S1. Advanced disc height loss at L5-S1. The remaining disc spaces are relatively well preserved. Mild lumbar levocurvature. No acute fractures are seen. IMPRESSION: 1. No new or acute bony abnormality of the lumbar spine. 2. Chronic bilateral L5 pars interarticularis defects with grade 2 anterolisthesis of L5 on S1. 3. Advanced disc height loss at L5-S1. Electronically Signed   By: Davina Poke D.O.   On: 08/11/2022 14:57     PROCEDURES:  Critical Care performed: No  Procedures   MEDICATIONS ORDERED IN ED: Medications  ketorolac (TORADOL) 30 MG/ML injection 30 mg (30 mg  Intramuscular Given 08/11/22 1432)     IMPRESSION / MDM / ASSESSMENT AND PLAN / ED COURSE  I reviewed the triage vital signs and the nursing notes.                              Differential diagnosis includes, but is not limited to, sciatica, DDD, radiculopathy, fracture  Patient's presentation is most consistent with acute complicated illness / injury requiring diagnostic workup.  Patient's diagnosis is consistent with LBP due to underlying disc disease, spondylolisthesis, and chronic pars defect. Patient will be discharged home with prescriptions for tizanidine, prednisone, Lyrica, and Lidoderm patches. Patient is to follow up with her primary provider or spine specialist as discussed.  Patient is given ED precautions to return to the ED for any worsening or new symptoms.   FINAL CLINICAL IMPRESSION(S) / ED DIAGNOSES   Final diagnoses:  Low back pain with sciatica, sciatica laterality unspecified, unspecified back pain laterality, unspecified chronicity  Spondylolisthesis at L5-S1 level     Rx / DC Orders   ED Discharge Orders          Ordered    predniSONE (DELTASONE) 20 MG tablet  Daily with breakfast        08/11/22 1519    pregabalin (LYRICA) 75 MG capsule  2 times daily        08/11/22 1519    tiZANidine (ZANAFLEX) 2 MG tablet  Every 8 hours PRN        08/11/22 1519    lidocaine (LIDODERM) 5 %  Every 12 hours PRN        08/11/22 1519  Note:  This document was prepared using Dragon voice recognition software and may include unintentional dictation errors.    Melvenia Needles, PA-C 08/11/22 1533    Lavonia Drafts, MD 08/12/22 1226

## 2022-08-11 NOTE — ED Triage Notes (Signed)
Patient to ED for back pain. States pain the middle of back. Started Monday and unable to bend over.

## 2022-11-01 DIAGNOSIS — S61211A Laceration without foreign body of left index finger without damage to nail, initial encounter: Secondary | ICD-10-CM | POA: Diagnosis not present

## 2022-11-01 DIAGNOSIS — M25532 Pain in left wrist: Secondary | ICD-10-CM | POA: Diagnosis not present

## 2022-12-17 ENCOUNTER — Encounter: Payer: Self-pay | Admitting: Emergency Medicine

## 2022-12-17 ENCOUNTER — Emergency Department: Payer: 59

## 2022-12-17 ENCOUNTER — Other Ambulatory Visit: Payer: Self-pay

## 2022-12-17 ENCOUNTER — Emergency Department
Admission: EM | Admit: 2022-12-17 | Discharge: 2022-12-17 | Disposition: A | Discharge status: Home / Self Care | Payer: 59 | Attending: Emergency Medicine | Admitting: Emergency Medicine

## 2022-12-17 DIAGNOSIS — M25532 Pain in left wrist: Secondary | ICD-10-CM | POA: Insufficient documentation

## 2022-12-17 MED ORDER — PREDNISONE 10 MG (21) PO TBPK: ORAL_TABLET | ORAL | 0 refills | Status: DC

## 2022-12-17 NOTE — ED Provider Notes (Signed)
[facility] Provider Note    Event Date/Time   First MD Initiated Contact with Patient 12/17/22 0719     (approximate)   History   Wrist Pain   HPI  Barbara Arellano is a 47 y.o. female with history of iron deficiency anemia and as listed in EMR presents to the emergency department for treatment and evaluation of nontraumatic left wrist pain and swelling for the past 3 months.  No relief with prefabricated splint..      Physical Exam   Triage Vital Signs: ED Triage Vitals  Enc Vitals Group     BP 12/17/22 0653 113/79     Pulse Rate 12/17/22 0653 70     Resp 12/17/22 0653 19     Temp 12/17/22 0653 98.2 F (36.8 C)     Temp Source 12/17/22 0653 Oral     SpO2 12/17/22 0653 100 %     Weight 12/17/22 0647 164 lb (74.4 kg)     Height 12/17/22 0647 5\' 4"  (1.626 m)     Head Circumference --      Peak Flow --      Pain Score 12/17/22 0647 9     Pain Loc --      Pain Edu? --      Excl. in GC? --     Most recent vital signs: Vitals:   12/17/22 0653  BP: 113/79  Pulse: 70  Resp: 19  Temp: 98.2 F (36.8 C)  SpO2: 100%    General: Awake, no distress.  CV:  Good peripheral perfusion.  Resp:  Normal effort.  Abd:  No distention.  Other:  Left wrist pain increases with movement of the left thumb   ED Results / Procedures / Treatments   Labs (all labs ordered are listed, but only abnormal results are displayed) Labs Reviewed - No data to display   EKG  Not indicated   RADIOLOGY  Image and radiology report reviewed and interpreted by me. Radiology report consistent with the same.  Image of the left wrist negative for acute concerns.  PROCEDURES:  Critical Care performed: No  Procedures   MEDICATIONS ORDERED IN ED:  Medications - No data to display   IMPRESSION / MDM / ASSESSMENT AND PLAN / ED COURSE   I have reviewed the triage note.  Differential diagnosis includes, but is not limited to, arthritis, wrist strain,  overuse syndrome  Patient's presentation is most consistent with condition requiring diagnostic workup  46 year old female presenting to the emergency department for treatment and evaluation of acute on chronic left wrist pain.  See HPI for further details.  Patient is right-hand dominant.  No new injury to the left wrist or hand.  Exam is most consistent with tendinitis.  Image is negative for acute concerns.  Plan will be to put her in a wrist brace with thumb extension and have her follow-up with orthopedics if not improving.      FINAL CLINICAL IMPRESSION(S) / ED DIAGNOSES   Final diagnoses:  Left wrist pain     Rx / DC Orders   ED Discharge Orders          Ordered    predniSONE (STERAPRED UNI-PAK 21 TAB) 10 MG (21) TBPK tablet        12/17/22 0809             Note:  This document was prepared using Dragon voice recognition software and may include unintentional dictation errors.   Radhika Dershem,  Kasandra Knudsen, FNP 12/17/22 1538    Jene Every, MD 12/27/22 [phone]

## 2022-12-17 NOTE — ED Notes (Signed)
See triage note  Presents with pain and swelling to left wrist  Denies any injury  Good pulses  States she noticed pain about 3 weeks ago

## 2022-12-17 NOTE — ED Triage Notes (Signed)
Patient ambulatory to triage with steady gait, without difficulty or distress noted; pt reports left wrist pain/swelling x 3 mos with no known injury

## 2023-01-03 ENCOUNTER — Emergency Department: Payer: 59

## 2023-01-03 ENCOUNTER — Emergency Department
Admission: EM | Admit: 2023-01-03 | Discharge: 2023-01-03 | Disposition: A | Discharge status: Home / Self Care | Payer: 59 | Attending: Emergency Medicine | Admitting: Emergency Medicine

## 2023-01-03 ENCOUNTER — Other Ambulatory Visit: Payer: Self-pay

## 2023-01-03 ENCOUNTER — Encounter: Payer: Self-pay | Admitting: Emergency Medicine

## 2023-01-03 DIAGNOSIS — N644 Mastodynia: Secondary | ICD-10-CM | POA: Insufficient documentation

## 2023-01-03 DIAGNOSIS — F17200 Nicotine dependence, unspecified, uncomplicated: Secondary | ICD-10-CM | POA: Diagnosis not present

## 2023-01-03 DIAGNOSIS — R0781 Pleurodynia: Secondary | ICD-10-CM | POA: Insufficient documentation

## 2023-01-03 MED ORDER — NAPROXEN 500 MG PO TABS: 500.0000 mg | ORAL_TABLET | Freq: Two times a day (BID) | ORAL | 0 refills | Status: AC

## 2023-01-03 NOTE — ED Provider Notes (Signed)
[facility] Provider Note    Event Date/Time   First MD Initiated Contact with Patient 01/03/23 [phone]     (approximate)   History   Breast Pain   HPI  Barbara Arellano is a 47 y.o. female presents to the ED with complaint of left lateral breast pain without known history of injury.  Patient states that this began 3 days ago and is worse with movement of her left arm.  She also has recently been seen for pain in her left hand and was placed on prednisone which she states did not help with this.  Patient denies any difficulty breathing or taking deep breaths.  She denies any redness, warmth, nipple discharge with this pain.  Patient is a smoker.     Physical Exam   Triage Vital Signs: ED Triage Vitals  Enc Vitals Group     BP 01/03/23 0928 115/86     Pulse Rate 01/03/23 0928 73     Resp 01/03/23 0928 18     Temp 01/03/23 0926 97.9 F (36.6 C)     Temp src --      SpO2 01/03/23 0928 99 %     Weight 01/03/23 0928 161 lb (73 kg)     Height 01/03/23 0928 5\' 4"  (1.626 m)     Head Circumference --      Peak Flow --      Pain Score 01/03/23 0928 9     Pain Loc --      Pain Edu? --      Excl. in GC? --     Most recent vital signs: Vitals:   01/03/23 0926 01/03/23 0928  BP:  115/86  Pulse:  73  Resp:  18  Temp: 97.9 F (36.6 C)   SpO2:  99%     General: Awake, no distress.  CV:  Good peripheral perfusion.  Resp:  Normal effort.  Lungs are clear bilaterally. Abd:  No distention.  Other:  Left breast with diffuse tenderness on palpation of the lateral aspect.  No soft tissue edema, erythema, warmth, nipple discharge, dimpling or orange peel like skin appearance.  There is tenderness on palpation of the left lateral rib.  Skin is intact.  No discoloration present.   ED Results / Procedures / Treatments   Labs (all labs ordered are listed, but only abnormal results are displayed) Labs Reviewed - No data to display     RADIOLOGY Chest  x-ray is negative for acute cardiopulmonary changes as reviewed and interpreted by myself independent of the radiologist.    PROCEDURES:  Critical Care performed:   Procedures   MEDICATIONS ORDERED IN ED: Medications - No data to display   IMPRESSION / MDM / ASSESSMENT AND PLAN / ED COURSE  I reviewed the triage vital signs and the nursing notes.   Differential diagnosis includes, but is not limited to, abscess, left breast pain, mastitis, breast cancer, muscle skeletal pain.  47 year old female presents to the ED with complaint of left breast pain for the last 3 days without known history of injury.  Physical exam is benign and chest x-ray was negative for muscle skeletal injury.  Currently patient does not have a primary care provider.  We discussed getting a mammography as she has not had one since 2018.  A list of clinics in this area were given to the patient and she agrees to call to make an appointment.  She is return to the emergency department if  any severe worsening of her symptoms.  A prescription for naproxen 5 and milligrams twice daily with food was sent to the pharmacy for her to begin taking for inflammation.      Patient's presentation is most consistent with acute complicated illness / injury requiring diagnostic workup.  FINAL CLINICAL IMPRESSION(S) / ED DIAGNOSES   Final diagnoses:  Breast pain, left     Rx / DC Orders   ED Discharge Orders          Ordered    naproxen (NAPROSYN) 500 MG tablet  2 times daily with meals        01/03/23 1146             Note:  This document was prepared using Dragon voice recognition software and may include unintentional dictation errors.   Tommi Rumps, PA-C 01/03/23 1218    Pilar Jarvis, MD 01/03/23 [phone]

## 2023-01-03 NOTE — ED Triage Notes (Signed)
Pt to ED for left breast pain x3 days. Reports pain worsens with movement to left arm. Denies rash and swelling.

## 2023-01-03 NOTE — ED Notes (Signed)
See triage note  Presents with pain to left breast  States pain is to lateral aspect of left breast  Developed pain yesterday No redness or drainage

## 2023-01-03 NOTE — Discharge Instructions (Addendum)
Call make an appointment within the clinics listed on your discharge papers.  A prescription for naproxen 500 mg twice daily with food was sent to the pharmacy for you begin taking with food.  Be aware that if you develop any stomach upset to discontinue the medication.  You may also put warm compresses to your breast as needed for discomfort.  Return to the emergency department if any severe worsening of your symptoms.  You definitely need to schedule for a mammography once you know what clinic you will be following up with for your primary care provider.  Please go to the following website to schedule new (and existing) patient appointments:   [email or web address]   The following is a list of primary care offices in the area who are accepting new patients at this time.  Please reach out to one of them directly and let them know you would like to schedule an appointment to follow up on an Emergency Department visit, and/or to establish a new primary care provider (PCP).  There are likely other primary care clinics in the are who are accepting new patients, but this is an excellent place to start:  [facility] Lead physician: Dr Shirlee Latch [address] [place], [place] [address] [phone]  [facility] Lead Physician: Dr Alba Cory [address], [place], [place] [address] [phone]  [facility]  Lead Physician: Dr Olevia Perches [address] [place], [place] [address] [phone]  [facility] Lead Physician: Dr Sofie Hartigan [address] [place], [place] [address] [phone]  [facility] Lead Physician: Dr Bari Edward [address] [place], [place], [place] [address] [phone]

## 2023-03-01 ENCOUNTER — Emergency Department
Admission: EM | Admit: 2023-03-01 | Discharge: 2023-03-01 | Disposition: A | Discharge status: Home / Self Care | Payer: 59 | Attending: Student in an Organized Health Care Education/Training Program | Admitting: Student in an Organized Health Care Education/Training Program

## 2023-03-01 ENCOUNTER — Encounter: Payer: Self-pay | Admitting: Emergency Medicine

## 2023-03-01 ENCOUNTER — Other Ambulatory Visit: Payer: Self-pay

## 2023-03-01 DIAGNOSIS — L03116 Cellulitis of left lower limb: Secondary | ICD-10-CM | POA: Insufficient documentation

## 2023-03-01 MED ORDER — CEPHALEXIN 500 MG PO CAPS: 500.0000 mg | ORAL_CAPSULE | Freq: Three times a day (TID) | ORAL | 0 refills | Status: AC

## 2023-03-01 NOTE — ED Triage Notes (Signed)
Patient ambulatory to triage with steady gait, without difficulty or distress noted; pt reports wk ago her dog wrapped cable wire around her left ankle; had several "burns, blisters" to area and cont to have pain; several small open areas noted without extension of redness or drainage

## 2023-03-01 NOTE — ED Notes (Signed)
See triage note  Presents with pain and swelling to left ankle  States she got her foot caught in a dog wire about 1 week ago   Some drainage note  Increased some with standing

## 2023-03-01 NOTE — ED Provider Notes (Signed)
[facility] Provider Note    Event Date/Time   First MD Initiated Contact with Patient 03/01/23 [phone]     (approximate)   History   Ankle Pain   HPI  Barbara Arellano is a 47 y.o. female   who presents to the ER for evaluation of left ankle pain and swelling.  Patient had injury to left ankle a few days ago when her dog that was on middle leash got the cable wrapped around her ankle ran pulling her she fell to the ground.  Has been able to walk.  Did not hit her head.  Denies any other associated injury.  Has been dressing the wounds that initially had blisters with gauze as well as Neosporin but feels like the pain is getting worse and has noted some swelling and redness.      Physical Exam   Triage Vital Signs: ED Triage Vitals  Encounter Vitals Group     BP 03/01/23 0703 136/78     Systolic BP Percentile --      Diastolic BP Percentile --      Pulse Rate 03/01/23 0703 73     Resp 03/01/23 0703 16     Temp 03/01/23 0703 98 F (36.7 C)     Temp src --      SpO2 03/01/23 0703 100 %     Weight 03/01/23 0700 158 lb (71.7 kg)     Height 03/01/23 0700 5\' 4"  (1.626 m)     Head Circumference --      Peak Flow --      Pain Score 03/01/23 0700 9     Pain Loc --      Pain Education --      Exclude from Growth Chart --     Most recent vital signs: Vitals:   03/01/23 0703  BP: 136/78  Pulse: 73  Resp: 16  Temp: 98 F (36.7 C)  SpO2: 100%     Constitutional: Alert  Eyes: Conjunctivae are normal.  Head: Atraumatic. Nose: No congestion/rhinnorhea. Mouth/Throat: Mucous membranes are moist.   Neck: Painless ROM.  Cardiovascular:   Good peripheral circulation. Respiratory: Normal respiratory effort.  No retractions.  Gastrointestinal: Soft and nontender.  Musculoskeletal:  no deformity Neurologic:  MAE spontaneously. No gross focal neurologic deficits are appreciated.  Skin: Linear rope burn appearing injury to the left ankle.  There is some  surrounding swelling and cellulitis.  Neurovascular intact distally. Psychiatric: Mood and affect are normal. Speech and behavior are normal.    ED Results / Procedures / Treatments   Labs (all labs ordered are listed, but only abnormal results are displayed) Labs Reviewed - No data to display   EKG     RADIOLOGY    PROCEDURES:  Critical Care performed:   Procedures   MEDICATIONS ORDERED IN ED: Medications - No data to display   IMPRESSION / MDM / ASSESSMENT AND PLAN / ED COURSE  I reviewed the triage vital signs and the nursing notes.                              Differential diagnosis includes, but is not limited to, cellulitis, burn, contusion, laceration  Patient presented to the ER for evaluation symptoms as described above.  Presentation is consistent with some mild cellulitis secondary to injury of skin a few days ago.  We discussed conservative management.  Will place on antibiotics.  We  discussed strict return precautions.  Patient agreeable plan.        FINAL CLINICAL IMPRESSION(S) / ED DIAGNOSES   Final diagnoses:  Cellulitis of left ankle     Rx / DC Orders   ED Discharge Orders          Ordered    cephALEXin (KEFLEX) 500 MG capsule  3 times daily        03/01/23 3295             Note:  This document was prepared using Dragon voice recognition software and may include unintentional dictation errors.    Willy Eddy, MD 03/01/23 [phone]

## 2023-05-09 ENCOUNTER — Emergency Department
Admission: EM | Admit: 2023-05-09 | Discharge: 2023-05-09 | Disposition: A | Discharge status: Home / Self Care | Payer: 59 | Attending: Emergency Medicine | Admitting: Emergency Medicine

## 2023-05-09 ENCOUNTER — Emergency Department: Payer: 59

## 2023-05-09 DIAGNOSIS — R519 Headache, unspecified: Secondary | ICD-10-CM | POA: Insufficient documentation

## 2023-05-09 DIAGNOSIS — H53149 Visual discomfort, unspecified: Secondary | ICD-10-CM | POA: Diagnosis not present

## 2023-05-09 DIAGNOSIS — R55 Syncope and collapse: Secondary | ICD-10-CM | POA: Insufficient documentation

## 2023-05-09 LAB — TROPONIN I (HIGH SENSITIVITY)
Troponin I (High Sensitivity): 4 ng/L (ref <18)
Troponin I (High Sensitivity): 3 ng/L (ref <18)

## 2023-05-09 LAB — CBC WITH DIFFERENTIAL/PLATELET
Abs Immature Granulocytes: 0.02 10*3/uL (ref 0.00–0.07)
Basophils Absolute: 0.1 10*3/uL (ref 0.0–0.1)
Basophils Relative: 1 %
Eosinophils Absolute: 0.3 10*3/uL (ref 0.0–0.5)
Eosinophils Relative: 3 %
HCT: 38.5 % (ref 36.0–46.0)
Hemoglobin: 13.3 g/dL (ref 12.0–15.0)
Immature Granulocytes: 0 %
Lymphocytes Relative: 20 %
Lymphs Abs: 1.9 10*3/uL (ref 0.7–4.0)
MCH: 32.9 pg (ref 26.0–34.0)
MCHC: 34.5 g/dL (ref 30.0–36.0)
MCV: 95.3 fL (ref 80.0–100.0)
Monocytes Absolute: 0.5 10*3/uL (ref 0.1–1.0)
Monocytes Relative: 5 %
Neutro Abs: 6.7 10*3/uL (ref 1.7–7.7)
Neutrophils Relative %: 71 %
Platelets: 254 10*3/uL (ref 150–400)
RBC: 4.04 MIL/uL (ref 3.87–5.11)
RDW: 12.1 % (ref 11.5–15.5)
WBC: 9.4 10*3/uL (ref 4.0–10.5)
nRBC: 0 % (ref 0.0–0.2)

## 2023-05-09 LAB — URINALYSIS, ROUTINE W REFLEX MICROSCOPIC
Bilirubin Urine: NEGATIVE
Glucose, UA: NEGATIVE mg/dL
Hgb urine dipstick: NEGATIVE
Ketones, ur: NEGATIVE mg/dL
Leukocytes,Ua: NEGATIVE
Nitrite: NEGATIVE
Protein, ur: NEGATIVE mg/dL
Specific Gravity, Urine: 1.014 (ref 1.005–1.030)
pH: 7 (ref 5.0–8.0)

## 2023-05-09 LAB — URINE DRUG SCREEN, QUALITATIVE (ARMC ONLY)
Amphetamines, Ur Screen: NOT DETECTED
Barbiturates, Ur Screen: NOT DETECTED
Benzodiazepine, Ur Scrn: NOT DETECTED
Cannabinoid 50 Ng, Ur ~~LOC~~: POSITIVE — AB
Cocaine Metabolite,Ur ~~LOC~~: NOT DETECTED
MDMA (Ecstasy)Ur Screen: NOT DETECTED
Methadone Scn, Ur: NOT DETECTED
Opiate, Ur Screen: NOT DETECTED
Phencyclidine (PCP) Ur S: NOT DETECTED
Tricyclic, Ur Screen: NOT DETECTED

## 2023-05-09 LAB — BASIC METABOLIC PANEL
Anion gap: 10 (ref 5–15)
BUN: 19 mg/dL (ref 6–20)
CO2: 26 mmol/L (ref 22–32)
Calcium: 8.9 mg/dL (ref 8.9–10.3)
Chloride: 104 mmol/L (ref 98–111)
Creatinine, Ser: 0.71 mg/dL (ref 0.44–1.00)
GFR, Estimated: >60 mL/min (ref >60)
Glucose, Bld: 94 mg/dL (ref 70–99)
Potassium: 3.8 mmol/L (ref 3.5–5.1)
Sodium: 140 mmol/L (ref 135–145)

## 2023-05-09 MED ORDER — PROCHLORPERAZINE EDISYLATE 10 MG/2ML IJ SOLN
10.0000 mg | Freq: Once | INTRAMUSCULAR | Status: AC
  Administered 2023-05-09: 10 mg via INTRAVENOUS
  Filled 2023-05-09: qty 2

## 2023-05-09 MED ORDER — DIPHENHYDRAMINE HCL 25 MG PO CAPS
50.0000 mg | ORAL_CAPSULE | Freq: Once | ORAL | Status: AC
  Administered 2023-05-09: 50 mg via ORAL
  Filled 2023-05-09: qty 2

## 2023-05-09 NOTE — ED Notes (Addendum)
Pt complaining of IV burning. Pt crying and sitting up. IV does not appear red, swollen, or warm. IV draws back blood and flushes appropriately. IV taken out per pt request.

## 2023-05-09 NOTE — ED Notes (Signed)
Pt states mild dizziness and "leg trembling" when she stood. Pt able to stand steady for ortho BP  05/09/23 0717  Vitals  BP 119/81  MAP (mmHg) 93  Pulse Rate 75  ECG Heart Rate 73  Resp 20  Orthostatic Lying   BP- Lying 119/81  Pulse- Lying 75  Orthostatic Sitting  BP- Sitting 122/76  Pulse- Sitting 79  Orthostatic Standing at 0 minutes  BP- Standing at 0 minutes 114/77  Pulse- Standing at 0 minutes 89  Oxygen Therapy  SpO2 98 %

## 2023-05-09 NOTE — ED Triage Notes (Addendum)
Pt from work for syncope.  Pt reports /N while driving to work.  When pt stepped out of car pt fell to ground and lost consciousness, witnesses reported LOC for about a minute.  Pt reports minor pain in L knee, minor abrasion noted.   Pt did not hit head, no blood thinners.

## 2023-05-09 NOTE — ED Notes (Signed)
Pt with registration when she said she felt "hot and dizzy" and passed out. Pt out for less than a minute and able to be aroused with sternal rubs. VS within normal limits. MD at bedside.

## 2023-05-09 NOTE — ED Provider Notes (Signed)
[facility] Provider Note    Event Date/Time   First MD Initiated Contact with Patient 05/09/23 [phone]     (approximate)   History   Loss of Consciousness   HPI  Barbara Arellano is a 47 y.o. female  who presents to the emergency department today after apparent syncopal episode. The patient was driving to work when she started feeling nauseous. States that after getting to work when she got out of her car she passed out. Denies hitting her head. Denies any chest pain or palpitations when this occurred. The patient states that she has never passed out before. Had otherwise been in her normal state of health earlier in the day and yesterday. At the time of my exam she states she has developed a headache and photophobia, does have history of migraines.        Physical Exam   Triage Vital Signs: ED Triage Vitals  Encounter Vitals Group     BP 05/09/23 0643 113/73     Systolic BP Percentile --      Diastolic BP Percentile --      Pulse Rate 05/09/23 0643 79     Resp 05/09/23 0643 16     Temp 05/09/23 0643 98.5 F (36.9 C)     Temp Source 05/09/23 0643 Oral     SpO2 05/09/23 0643 98 %     Weight 05/09/23 0650 158 lb (71.7 kg)     Height 05/09/23 0650 5\' 4"  (1.626 m)     Head Circumference --      Peak Flow --      Pain Score 05/09/23 0650 7     Pain Loc --      Pain Education --      Exclude from Growth Chart --     Most recent vital signs: Vitals:   05/09/23 0643  BP: 113/73  Pulse: 79  Resp: 16  Temp: 98.5 F (36.9 C)  SpO2: 98%   General: Awake, alert, oriented. CV:  Good peripheral perfusion. Regular rate and rhythm. Resp:  Normal effort. Lungs clear. Abd:  No distention.    ED Results / Procedures / Treatments   Labs (all labs ordered are listed, but only abnormal results are displayed) Labs Reviewed  URINALYSIS, ROUTINE W REFLEX MICROSCOPIC - Abnormal; Notable for the following components:      Result Value   Color, Urine  YELLOW (*)    APPearance CLEAR (*)    All other components within normal limits  URINE DRUG SCREEN, QUALITATIVE ([facility] ONLY) - Abnormal; Notable for the following components:   Cannabinoid 50 Ng, Ur [place] POSITIVE (*)    All other components within normal limits  CBC WITH DIFFERENTIAL/PLATELET  BASIC METABOLIC PANEL  TROPONIN I (HIGH SENSITIVITY)  TROPONIN I (HIGH SENSITIVITY)     EKG  I, Phineas Semen, attending physician, personally viewed and interpreted this EKG  EKG Time: 0647 Rate: 78 Rhythm: sinus rhythm Axis: normal Intervals: qtc 446 QRS: narrow ST changes: no st elevation Impression: normal ekg   RADIOLOGY I independently interpreted and visualized the CT head. My interpretation: No bleed Radiology interpretation:  IMPRESSION:  No CT evidence of intracranial injury.      PROCEDURES:  Critical Care performed: No   MEDICATIONS ORDERED IN ED: Medications - No data to display   IMPRESSION / MDM / ASSESSMENT AND PLAN / ED COURSE  I reviewed the triage vital signs and the nursing notes.  Differential diagnosis includes, but is not limited to, anemia, electrolyte abnormality, arrhythmia  Patient's presentation is most consistent with acute presentation with potential threat to life or bodily function.   The patient is on the cardiac monitor to evaluate for evidence of arrhythmia and/or significant heart rate changes.  Patient presented to the emergency department today after apparent syncopal episode.  On exam patient is awake and alert.  She is complaining of a headache however denies hitting her head.  States she does have a history of migraines.  EKG without concerning arrhythmia or ST elevation.  Will check blood work to evaluate for anemia, electrolyte abnormalities.  Blood work without concerning anemia or electrolyte abnormality.  Troponin was negative.  At this time somewhat unclear etiology of the patient's syncope.  She  did receive Compazine to help control her headache however had a slight reaction so was given some Benadryl.  At this time given reassuring blood work I think is reasonable for patient be discharged.  Will refer patient to primary care.      FINAL CLINICAL IMPRESSION(S) / ED DIAGNOSES   Final diagnoses:  Syncope, unspecified syncope type    Note:  This document was prepared using Dragon voice recognition software and may include unintentional dictation errors.    Phineas Semen, MD 05/09/23 1153

## 2023-05-09 NOTE — Discharge Instructions (Signed)
Please seek medical attention for any high fevers, chest pain, shortness of breath, change in behavior, persistent vomiting, bloody stool or any other new or concerning symptoms.  

## 2023-06-21 ENCOUNTER — Emergency Department
Admission: EM | Admit: 2023-06-21 | Discharge: 2023-06-21 | Disposition: A | Discharge status: Home / Self Care | Payer: 59 | Attending: Emergency Medicine | Admitting: Emergency Medicine

## 2023-06-21 ENCOUNTER — Other Ambulatory Visit: Payer: Self-pay

## 2023-06-21 DIAGNOSIS — M25532 Pain in left wrist: Secondary | ICD-10-CM | POA: Diagnosis present

## 2023-06-21 DIAGNOSIS — G8929 Other chronic pain: Secondary | ICD-10-CM | POA: Diagnosis not present

## 2023-06-21 MED ORDER — PREDNISONE 10 MG (21) PO TBPK: ORAL_TABLET | ORAL | 0 refills | Status: AC

## 2023-06-21 MED ORDER — DEXAMETHASONE SODIUM PHOSPHATE 10 MG/ML IJ SOLN
10.0000 mg | Freq: Once | INTRAMUSCULAR | Status: AC
  Administered 2023-06-21: 10 mg via INTRAMUSCULAR
  Filled 2023-06-21: qty 1

## 2023-06-21 MED ORDER — IBUPROFEN 800 MG PO TABS: 800.0000 mg | ORAL_TABLET | Freq: Three times a day (TID) | ORAL | 0 refills | Status: AC | PRN

## 2023-06-21 MED ORDER — KETOROLAC TROMETHAMINE 30 MG/ML IJ SOLN
30.0000 mg | Freq: Once | INTRAMUSCULAR | Status: AC
  Administered 2023-06-21: 30 mg via INTRAMUSCULAR
  Filled 2023-06-21: qty 1

## 2023-06-21 NOTE — ED Notes (Signed)
This RN not involved in patient care. Assessed and discharged by PA.

## 2023-06-21 NOTE — Discharge Instructions (Addendum)
Wear brace fro comfort especially at night when sleeping. Please follow up with Orthopedics. Rest, apply ice to the affected area to reduce swelling. Use an ace wrap for compression intermittently through the day. Elevate above heart level to help reduce swelling also.

## 2023-06-21 NOTE — ED Triage Notes (Signed)
Pt here with left wrist pain since April but getting worse. Pt has gotten scan that are negative. Pt states she cannot bend her thumb and it is painful to move.

## 2023-06-21 NOTE — ED Provider Notes (Signed)
[facility] Emergency Department Provider Note     Event Date/Time   First MD Initiated Contact with Patient 06/21/23 1534     (approximate)   History   Wrist Pain   HPI  Barbara Arellano is a 47 y.o. female presents to the ED with complaint of persistent left wrist pain since April. No new injury. She has tried ibuprofen, tylenol and a wrist brace with no relief. She reports the thumb spica brace makes it worse. She has not been able to follow up with orthopedics due to [employer]. Denies weakness and numbness.     Physical Exam   Triage Vital Signs: ED Triage Vitals  Encounter Vitals Group     BP 06/21/23 1449 (!) 133/99     Systolic BP Percentile --      Diastolic BP Percentile --      Pulse Rate 06/21/23 1449 80     Resp 06/21/23 1449 16     Temp 06/21/23 1449 98.4 F (36.9 C)     Temp Source 06/21/23 1449 Oral     SpO2 06/21/23 1449 96 %     Weight 06/21/23 1450 158 lb 1.1 oz (71.7 kg)     Height 06/21/23 1450 5\' 4"  (1.626 m)     Head Circumference --      Peak Flow --      Pain Score 06/21/23 1450 10     Pain Loc --      Pain Education --      Exclude from Growth Chart --     Most recent vital signs: Vitals:   06/21/23 1449  BP: (!) 133/99  Pulse: 80  Resp: 16  Temp: 98.4 F (36.9 C)  SpO2: 96%    General Awake, no distress.  HEENT NCAT. PERRL. EOMI. No rhinorrhea. Mucous membranes are moist.  CV:  Good peripheral perfusion.  RESP:  Normal effort.  ABD:  No distention.  Other:  Left wrist reveals no visible deformities. Neurovascular status intact. Brisk capillaries. (+) Finkelstein test.    ED Results / Procedures / Treatments   Labs (all labs ordered are listed, but only abnormal results are displayed) Labs Reviewed - No data to display  No results found.  PROCEDURES:  Critical Care performed: No  Procedures   MEDICATIONS ORDERED IN ED: Medications  dexamethasone (DECADRON) injection 10 mg (10 mg Intramuscular  Given 06/21/23 1649)  ketorolac (TORADOL) 30 MG/ML injection 30 mg (30 mg Intramuscular Given 06/21/23 1649)     IMPRESSION / MDM / ASSESSMENT AND PLAN / ED COURSE  I reviewed the triage vital signs and the nursing notes.                               47 y.o. female presents to the emergency department for evaluation and treatment of chronic left wrist pain. See HPI for further details.   Differential diagnosis includes, but is not limited to tenosynovitis, arthritis, tendonitis  Patient's presentation is most consistent with acute, uncomplicated illness.  Patients presentation is clinically consistent with tenosynovitis given (+) finklestein test on physical exam. No new injury. No imaging indicated at this time. Chart reviewed. No abnormalities on last xray. Patient was given Toradol and decadron injection in ED for pain management. Patient given forearm wrist splint for trial. Encouraged close follow up with orthopedics now that patient has [employer]. Patient is in stable condition for discharge home. ED  precautions discussed. All questions and concerns were addressed during this ED visit.     FINAL CLINICAL IMPRESSION(S) / ED DIAGNOSES   Final diagnoses:  Chronic pain of right wrist    Rx / DC Orders   ED Discharge Orders          Ordered    ibuprofen (ADVIL) 800 MG tablet  Every 8 hours PRN        06/21/23 1654    predniSONE (STERAPRED UNI-PAK 21 TAB) 10 MG (21) TBPK tablet        06/21/23 1654            Note:  This document was prepared using Dragon voice recognition software and may include unintentional dictation errors.    Romeo Apple, Schyler Butikofer A, PA-C 06/21/23 1815    Chesley Noon, MD 06/21/23 2259

## 2023-06-30 ENCOUNTER — Emergency Department
Admission: EM | Admit: 2023-06-30 | Discharge: 2023-06-30 | Disposition: A | Discharge status: Home / Self Care | Payer: 59 | Attending: Student in an Organized Health Care Education/Training Program | Admitting: Student in an Organized Health Care Education/Training Program

## 2023-06-30 ENCOUNTER — Emergency Department: Payer: 59

## 2023-06-30 DIAGNOSIS — R1032 Left lower quadrant pain: Secondary | ICD-10-CM | POA: Diagnosis present

## 2023-06-30 DIAGNOSIS — K625 Hemorrhage of anus and rectum: Secondary | ICD-10-CM | POA: Diagnosis not present

## 2023-06-30 LAB — COMPREHENSIVE METABOLIC PANEL
ALT: 11 U/L (ref 0–44)
AST: 22 U/L (ref 15–41)
Albumin: 3.8 g/dL (ref 3.5–5.0)
Alkaline Phosphatase: 72 U/L (ref 38–126)
Anion gap: 12 (ref 5–15)
BUN: 20 mg/dL (ref 6–20)
CO2: 23 mmol/L (ref 22–32)
Calcium: 8.7 mg/dL — ABNORMAL LOW (ref 8.9–10.3)
Chloride: 101 mmol/L (ref 98–111)
Creatinine, Ser: 0.76 mg/dL (ref 0.44–1.00)
GFR, Estimated: >60 mL/min (ref >60)
Glucose, Bld: 97 mg/dL (ref 70–99)
Potassium: 4.1 mmol/L (ref 3.5–5.1)
Sodium: 136 mmol/L (ref 135–145)
Total Bilirubin: 0.8 mg/dL (ref <1.2)
Total Protein: 6.7 g/dL (ref 6.5–8.1)

## 2023-06-30 LAB — CBC WITH DIFFERENTIAL/PLATELET
Abs Immature Granulocytes: 0.05 10*3/uL (ref 0.00–0.07)
Basophils Absolute: 0.1 10*3/uL (ref 0.0–0.1)
Basophils Relative: 1 %
Eosinophils Absolute: 0.5 10*3/uL (ref 0.0–0.5)
Eosinophils Relative: 3 %
HCT: 34.8 % — ABNORMAL LOW (ref 36.0–46.0)
Hemoglobin: 12.2 g/dL (ref 12.0–15.0)
Immature Granulocytes: 0 %
Lymphocytes Relative: 32 %
Lymphs Abs: 4.8 10*3/uL — ABNORMAL HIGH (ref 0.7–4.0)
MCH: 32.6 pg (ref 26.0–34.0)
MCHC: 35.1 g/dL (ref 30.0–36.0)
MCV: 93 fL (ref 80.0–100.0)
Monocytes Absolute: 0.9 10*3/uL (ref 0.1–1.0)
Monocytes Relative: 6 %
Neutro Abs: 9 10*3/uL — ABNORMAL HIGH (ref 1.7–7.7)
Neutrophils Relative %: 58 %
Platelets: 267 10*3/uL (ref 150–400)
RBC: 3.74 MIL/uL — ABNORMAL LOW (ref 3.87–5.11)
RDW: 11.7 % (ref 11.5–15.5)
WBC: 15.3 10*3/uL — ABNORMAL HIGH (ref 4.0–10.5)
nRBC: 0 % (ref 0.0–0.2)

## 2023-06-30 MED ORDER — LACTATED RINGERS IV SOLN: INTRAVENOUS | Status: DC

## 2023-06-30 MED ORDER — METRONIDAZOLE 500 MG PO TABS: 500.0000 mg | ORAL_TABLET | Freq: Two times a day (BID) | ORAL | 0 refills | Status: AC

## 2023-06-30 MED ORDER — IOHEXOL 300 MG/ML  SOLN
100.0000 mL | Freq: Once | INTRAMUSCULAR | Status: AC | PRN
  Administered 2023-06-30: 100 mL via INTRAVENOUS

## 2023-06-30 MED ORDER — HYDROMORPHONE HCL 1 MG/ML IJ SOLN
0.5000 mg | INTRAMUSCULAR | Status: DC | PRN
  Administered 2023-06-30: 0.5 mg via INTRAVENOUS
  Filled 2023-06-30: qty 0.5

## 2023-06-30 MED ORDER — MORPHINE SULFATE (PF) 4 MG/ML IV SOLN
4.0000 mg | INTRAVENOUS | Status: DC | PRN
  Administered 2023-06-30: 4 mg via INTRAVENOUS
  Filled 2023-06-30: qty 1

## 2023-06-30 MED ORDER — METRONIDAZOLE 500 MG PO TABS: 500.0000 mg | ORAL_TABLET | Freq: Two times a day (BID) | ORAL | 0 refills | Status: DC

## 2023-06-30 MED ORDER — METRONIDAZOLE 500 MG/100ML IV SOLN
500.0000 mg | Freq: Once | INTRAVENOUS | Status: AC
  Administered 2023-06-30: 500 mg via INTRAVENOUS
  Filled 2023-06-30: qty 100

## 2023-06-30 MED ORDER — ONDANSETRON HCL 4 MG/2ML IJ SOLN
4.0000 mg | Freq: Once | INTRAMUSCULAR | Status: AC
  Administered 2023-06-30: 4 mg via INTRAVENOUS
  Filled 2023-06-30: qty 2

## 2023-06-30 MED ORDER — CIPROFLOXACIN HCL 500 MG PO TABS: 500.0000 mg | ORAL_TABLET | Freq: Two times a day (BID) | ORAL | 0 refills | Status: AC

## 2023-06-30 MED ORDER — CEFTRIAXONE SODIUM 2 G IJ SOLR
2.0000 g | Freq: Once | INTRAMUSCULAR | Status: AC
  Administered 2023-06-30: 2 g via INTRAVENOUS
  Filled 2023-06-30: qty 20

## 2023-06-30 NOTE — Consult Note (Signed)
ER Consult Note   Patient: Barbara Arellano UJW:[id number] DOB: [birth year] DOA: 06/30/2023 DOS: the patient was seen and examined on 06/30/2023 PCP: Pcp, No  Patient coming from: Home - lives with husband   Chief Complaint: Rectal bleeding  HPI: Barbara Arellano is a 47 y.o. female with medical history significant of anxiety/depression who presented on 12/26 with rectal bleeding. She had 4 bloody bowel movements since yesterday, her last at 0630 today.  Also with cramping abdominal pain.  No fever, no sick contacts.  Photos reviewed, scant amount of bright red blood in the commode.    ER Course:  Abdominal pain with rectal bleeding x 24 hours.  Hemodynamically stable.  Diverticulosis on scan, ?developing diverticulitis.  Given abx.  Still having pain.  Likely needs overnight observation.     Review of Systems: As mentioned in the history of present illness. All other systems reviewed and are negative. Past Medical History:  Diagnosis Date   Anxiety    Cervical arthritis    Depression    Fibroid, uterine    GERD (gastroesophageal reflux disease)    Pneumonia    Past Surgical History:  Procedure Laterality Date   BILATERAL SALPINGECTOMY     age 76   BREAST BIOPSY Right 12/15/2017   neg   FRACTURE SURGERY Left 2015   foot   LAPAROSCOPIC LYSIS OF ADHESIONS Right 03/25/2020   Procedure: Laparoscopy with Right Ovarian Cystectomy;  Surgeon: Nadara Mustard, MD;  Location: [facility] ORS;  Service: Gynecology;  Laterality: Right;   OOPHORECTOMY Left 2015   ruptured cyst   OVARIAN CYST REMOVAL     TUBAL LIGATION     Social History:  reports that she has been smoking cigarettes. She has been exposed to tobacco smoke. She has never used smokeless tobacco. She reports current drug use. Drug: Marijuana. She reports that she does not drink alcohol.  Allergies  Allergen Reactions   Percocet [Oxycodone-Acetaminophen] Itching    Family History  Problem Relation Age of Onset   Heart  disease Mother    Cancer Father 26       bone marrow   Heart disease Father    Breast cancer Maternal Aunt        late 30's    Prior to Admission medications   Medication Sig Start Date End Date Taking? Authorizing Provider  ibuprofen (ADVIL) 800 MG tablet Take 1 tablet (800 mg total) by mouth every 8 (eight) hours as needed. 06/21/23   Romeo Apple, Myah A, PA-C  naproxen (NAPROSYN) 500 MG tablet Take 1 tablet (500 mg total) by mouth 2 (two) times daily with a meal. 01/03/23   Tommi Rumps, PA-C  predniSONE (STERAPRED UNI-PAK 21 TAB) 10 MG (21) TBPK tablet Take 6 pills on day 1 then decrease by 1 pill each day. 06/21/23   Romeo Apple, Myah A, PA-C  pregabalin (LYRICA) 75 MG capsule Take 1 capsule (75 mg total) by mouth 2 (two) times daily. 08/11/22 09/10/22  Menshew, Charlesetta Ivory, PA-C    Physical Exam: Vitals:   06/30/23 1000 06/30/23 1030 06/30/23 1215 06/30/23 1217  BP: 122/87 119/81 128/83 128/83  Pulse: 76 70  70  Resp:    18  Temp:    97.6 F (36.4 C)  TempSrc:    Axillary  SpO2: 98% 99%  99%   General:  Appears calm and comfortable and is in NAD Eyes:   EOMI, normal lids, iris ENT:  grossly normal hearing, lips & tongue, mmm Neck:  no LAD, masses or thyromegaly Cardiovascular:  RRR, no m/r/g. No LE edema.  Respiratory:   CTA bilaterally with no wheezes/rales/rhonchi.  Normal respiratory effort. Abdomen:  soft, mildly diffusely tender Back:   normal alignment, no CVAT Skin:  no rash or induration seen on limited exam Musculoskeletal:  grossly normal tone BUE/BLE, good ROM, no bony abnormality Psychiatric:  grossly normal mood and affect, speech fluent and appropriate, AOx3 Neurologic:  CN 2-12 grossly intact, moves all extremities in coordinated fashion   Radiological Exams on Admission: Independently reviewed - see discussion in A/P where applicable  CT ABDOMEN PELVIS W CONTRAST Result Date: 06/30/2023 CLINICAL DATA:  Rectal bleeding since Monday.  Lower back pain.  EXAM: CT ABDOMEN AND PELVIS WITH CONTRAST TECHNIQUE: Multidetector CT imaging of the abdomen and pelvis was performed using the standard protocol following bolus administration of intravenous contrast. RADIATION DOSE REDUCTION: This exam was performed according to the departmental dose-optimization program which includes automated exposure control, adjustment of the mA and/or kV according to patient size and/or use of iterative reconstruction technique. CONTRAST:  OMNIPAQUE IOHEXOL 300 MG/ML  SOLN COMPARISON:  07/27/2021 FINDINGS: Lower chest: Mild interlobular septal thickening and patchy ground-glass opacities in the bilateral lower lobes. 7 mm subpleural pulmonary nodule in the right lower lobe is stable since 2021 and benign. No follow-up recommended. Hepatobiliary: Unremarkable liver. Cholecystectomy. No biliary dilation. Pancreas: Unremarkable. Spleen: Unremarkable. Adrenals/Urinary Tract: Normal adrenal glands. No urinary calculi or hydronephrosis. Bladder is unremarkable. Stomach/Bowel: Normal caliber large and small bowel. Nondistended descending and sigmoid colon. No bowel wall thickening. There are a few diverticula in the descending colon without evidence of diverticulitis. The appendix is normal.Stomach is within normal limits. Vascular/Lymphatic: Aortic atherosclerosis. No enlarged abdominal or pelvic lymph nodes. Reproductive: Uterus and bilateral adnexa are unremarkable. Other: No free intraperitoneal fluid or air. Musculoskeletal: No acute fracture. 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 IMPRESSION: 1. No acute abnormality in the abdomen or pelvis. 2.  Colonic diverticulosis without diverticulitis. 3. Mild interlobular septal thickening and patchy ground-glass opacities in the bilateral lower lobes may be due to edema or atypical infection. 4. Similar L5 spondylolysis with grade 1-2 anterolisthesis. Aortic Atherosclerosis (ICD10-I70.0). Electronically Signed   By:  Minerva Fester M.D.   On: 06/30/2023 11:30    EKG: none   Labs on Admission: I have personally reviewed the available labs and imaging studies at the time of the admission.  Pertinent labs:    Normal CMP WBC 15.3 Hgb 12.2, down from 13.3 on 11/4   Assessment and Plan: Active Problems:   Bright red rectal bleeding    Rectal bleeding Diverticular vs. Hemorrhoidal bleeding Hemodynamically stable Minimal decrease in Hgb Discussed overnight monitoring vs. Dc to home - she is in agreement with dc to home Recommend continuing Cipro/Flagyl to complete course for diverticulitis Recommend outpatient GI follow up for consideration of colonoscopy Return to ER if bleeding persists/worsens and/or she develops light-headedness, dizzy, SOB with ambulation   Author: Jonah Blue, MD 06/30/2023 12:45 PM  For on call review [email or web address].

## 2023-06-30 NOTE — Sepsis Progress Note (Signed)
eLink is following this Code Sepsis.  Notified provider of need to order lactic acid.  

## 2023-06-30 NOTE — ED Triage Notes (Signed)
Pt here with rectal bleeding since Monday. Pt also having lower back pain. Pt denies NVD but endorses fatigue.

## 2023-06-30 NOTE — ED Provider Notes (Addendum)
[facility] Provider Note    Event Date/Time   First MD Initiated Contact with Patient 06/30/23 [phone]     (approximate)   History   Rectal Bleeding   HPI  Barbara Arellano is a 47 y.o. female who presents to the ER for evaluation of several days of left lower quadrant abdominal pain as well as some rectal bleeding.  Denies any melena.  Has never had symptoms like this before.  Does have a history of ovarian cyst.  This feels different.  Denies any fevers or chills.  No recent antibiotics.  No history of ulcerative colitis or Crohn's.     Physical Exam   Triage Vital Signs: ED Triage Vitals [06/30/23 0907]  Encounter Vitals Group     BP 117/82     Systolic BP Percentile      Diastolic BP Percentile      Pulse Rate 89     Resp 17     Temp 97.8 F (36.6 C)     Temp Source Oral     SpO2 100 %     Weight      Height      Head Circumference      Peak Flow      Pain Score 10     Pain Loc      Pain Education      Exclude from Growth Chart     Most recent vital signs: Vitals:   06/30/23 1215 06/30/23 1217  BP: 128/83 128/83  Pulse:  70  Resp:  18  Temp:  97.6 F (36.4 C)  SpO2:  99%     Constitutional: Alert  Eyes: Conjunctivae are normal.  Head: Atraumatic. Nose: No congestion/rhinnorhea. Mouth/Throat: Mucous membranes are moist.   Neck: Painless ROM.  Cardiovascular:   Good peripheral circulation. Respiratory: Normal respiratory effort.  No retractions.  Gastrointestinal: Soft and nontender.  Musculoskeletal:  no deformity Neurologic:  MAE spontaneously. No gross focal neurologic deficits are appreciated.  Skin:  Skin is warm, dry and intact. No rash noted. Psychiatric: Mood and affect are normal. Speech and behavior are normal.    ED Results / Procedures / Treatments   Labs (all labs ordered are listed, but only abnormal results are displayed) Labs Reviewed  CBC WITH DIFFERENTIAL/PLATELET - Abnormal; Notable for the  following components:      Result Value   WBC 15.3 (*)    RBC 3.74 (*)    HCT 34.8 (*)    Neutro Abs 9.0 (*)    Lymphs Abs 4.8 (*)    All other components within normal limits  COMPREHENSIVE METABOLIC PANEL - Abnormal; Notable for the following components:   Calcium 8.7 (*)    All other components within normal limits  CULTURE, BLOOD (SINGLE)  TYPE AND SCREEN     EKG     RADIOLOGY Please see ED Course for my review and interpretation.  I personally reviewed all radiographic images ordered to evaluate for the above acute complaints and reviewed radiology reports and findings.  These findings were personally discussed with the patient.  Please see medical record for radiology report.    PROCEDURES:  Critical Care performed: No  Procedures   MEDICATIONS ORDERED IN ED: Medications  morphine (PF) 4 MG/ML injection 4 mg (4 mg Intravenous Given 06/30/23 0955)  lactated ringers infusion ( Intravenous New Bag/Given 06/30/23 1218)  cefTRIAXone (ROCEPHIN) 2 g in sodium chloride 0.9 % 100 mL IVPB (2 g Intravenous New Bag/Given  06/30/23 1211)  metroNIDAZOLE (FLAGYL) IVPB 500 mg (500 mg Intravenous New Bag/Given 06/30/23 1222)  HYDROmorphone (DILAUDID) injection 0.5 mg (has no administration in time range)  ondansetron (ZOFRAN) injection 4 mg (4 mg Intravenous Given 06/30/23 0955)  iohexol (OMNIPAQUE) 300 MG/ML solution 100 mL (100 mLs Intravenous Contrast Given 06/30/23 1054)     IMPRESSION / MDM / ASSESSMENT AND PLAN / ED COURSE  I reviewed the triage vital signs and the nursing notes.                              Differential diagnosis includes, but is not limited to, diverticulitis, hemorrhoid, UGIB, mass, hemorrhoid  Patient presenting to the ER for evaluation of symptoms as described above.  Based on symptoms, risk factors and considered above differential, this presenting complaint could reflect a potentially life-threatening illness therefore the patient will be placed  on continuous pulse oximetry and telemetry for monitoring.  Laboratory evaluation will be sent to evaluate for the above complaints.  Will give IV morphine for pain.  Will order CT imaging to evaluate for the but differential.    Clinical Course as of 06/30/23 1231  Thu Jun 30, 2023  1155 Patient remains hemodynamically stable but still in quite a bit of abdominal pain.  Its primarily left lower quadrant.  She does have diverticulosis and with the bleeding suspect there might be some early diverticulitis given her white count.  Will cover with antibiotics.  Given her symptoms and bleeding will discuss case in consultation with hospitalist for admission. [PR]    Clinical Course User Index [PR] Willy Eddy, MD   Patient seen by hospitalist in consultation felt stable and appropriate for outpatient follow-up.  Discussed return precautions.  FINAL CLINICAL IMPRESSION(S) / ED DIAGNOSES   Final diagnoses:  Rectal bleeding  Left lower quadrant abdominal pain     Rx / DC Orders   ED Discharge Orders     None        Note:  This document was prepared using Dragon voice recognition software and may include unintentional dictation errors.        Willy Eddy, MD 06/30/23 [phone]

## 2023-07-05 LAB — CULTURE, BLOOD (SINGLE)
Culture: NO GROWTH
Special Requests: ADEQUATE

## 2024-01-29 ENCOUNTER — Emergency Department
Admission: EM | Admit: 2024-01-29 | Discharge: 2024-01-29 | Disposition: A | Discharge status: Home / Self Care | Attending: Emergency Medicine | Admitting: Emergency Medicine

## 2024-01-29 ENCOUNTER — Emergency Department

## 2024-01-29 ENCOUNTER — Other Ambulatory Visit: Payer: Self-pay

## 2024-01-29 DIAGNOSIS — R079 Chest pain, unspecified: Secondary | ICD-10-CM | POA: Diagnosis present

## 2024-01-29 DIAGNOSIS — K209 Esophagitis, unspecified without bleeding: Secondary | ICD-10-CM | POA: Diagnosis not present

## 2024-01-29 LAB — BASIC METABOLIC PANEL WITH GFR
Anion gap: 13 (ref 5–15)
BUN: 11 mg/dL (ref 6–20)
CO2: 23 mmol/L (ref 22–32)
Calcium: 9.6 mg/dL (ref 8.9–10.3)
Chloride: 105 mmol/L (ref 98–111)
Creatinine, Ser: 0.99 mg/dL (ref 0.44–1.00)
GFR, Estimated: >60 mL/min (ref >60)
Glucose, Bld: 87 mg/dL (ref 70–99)
Potassium: 3.5 mmol/L (ref 3.5–5.1)
Sodium: 141 mmol/L (ref 135–145)

## 2024-01-29 LAB — CBC
HCT: 35.1 % — ABNORMAL LOW (ref 36.0–46.0)
Hemoglobin: 12.4 g/dL (ref 12.0–15.0)
MCH: 32.8 pg (ref 26.0–34.0)
MCHC: 35.3 g/dL (ref 30.0–36.0)
MCV: 92.9 fL (ref 80.0–100.0)
Platelets: 227 K/uL (ref 150–400)
RBC: 3.78 MIL/uL — ABNORMAL LOW (ref 3.87–5.11)
RDW: 11.8 % (ref 11.5–15.5)
WBC: 10.5 K/uL (ref 4.0–10.5)
nRBC: 0 % (ref 0.0–0.2)

## 2024-01-29 LAB — TROPONIN I (HIGH SENSITIVITY): Troponin I (High Sensitivity): 5 ng/L (ref <18)

## 2024-01-29 MED ORDER — SUCRALFATE 1 G PO TABS: 1.0000 g | ORAL_TABLET | Freq: Three times a day (TID) | ORAL | 0 refills | Status: AC

## 2024-01-29 MED ORDER — PANTOPRAZOLE SODIUM 40 MG PO TBEC: 40.0000 mg | DELAYED_RELEASE_TABLET | Freq: Every day | ORAL | 0 refills | Status: AC

## 2024-01-29 MED ORDER — LIDOCAINE VISCOUS HCL 2 % MT SOLN
15.0000 mL | Freq: Once | OROMUCOSAL | Status: AC
  Administered 2024-01-29: 15 mL via ORAL
  Filled 2024-01-29: qty 15

## 2024-01-29 MED ORDER — TRAMADOL HCL 50 MG PO TABS
50.0000 mg | ORAL_TABLET | Freq: Four times a day (QID) | ORAL | 0 refills | Status: AC | PRN
Start: 2024-01-29 — End: 2025-01-28

## 2024-01-29 MED ORDER — IOHEXOL 350 MG/ML SOLN
75.0000 mL | Freq: Once | INTRAVENOUS | Status: AC | PRN
  Administered 2024-01-29: 75 mL via INTRAVENOUS

## 2024-01-29 MED ORDER — ALUM & MAG HYDROXIDE-SIMETH 200-200-20 MG/5ML PO SUSP
30.0000 mL | Freq: Once | ORAL | Status: AC
  Administered 2024-01-29: 30 mL via ORAL
  Filled 2024-01-29: qty 30

## 2024-01-29 NOTE — ED Triage Notes (Signed)
 Pt comes in via pov with complaints of chest pain that started yesterday. Pt states that she feels the sensation that something is stuck in the middle of her chest and radiates to her left shoulder. Pt complains of pain 10/10.

## 2024-01-29 NOTE — ED Provider Notes (Signed)
 [facility] Provider Note    Event Date/Time   First MD Initiated Contact with Patient 01/29/24 1410     (approximate)   History   Chest Pain   HPI  Barbara Arellano is a 48 y.o. female with a history of anemia presents with complaints of chest discomfort.  Patient reports when she swallows anything she has pain in the center of her chest, this started yesterday.  It did not start while she was eating something.  She is able to get food and liquids down without difficulty it is just uncomfortable.  No new medications.  No history of acid reflux.  Today she reports she has pain now radiating up into her left upper chest and shoulder which is new     Physical Exam   Triage Vital Signs: ED Triage Vitals  Encounter Vitals Group     BP 01/29/24 1354 (!) 119/96     Girls Systolic BP Percentile --      Girls Diastolic BP Percentile --      Boys Systolic BP Percentile --      Boys Diastolic BP Percentile --      Pulse Rate 01/29/24 1354 87     Resp 01/29/24 1354 16     Temp 01/29/24 1354 99 F (37.2 C)     Temp Source 01/29/24 1354 Oral     SpO2 01/29/24 1354 99 %     Weight 01/29/24 1354 73.9 kg (163 lb)     Height 01/29/24 1354 1.626 m (5' 4)     Head Circumference --      Peak Flow --      Pain Score 01/29/24 1358 10     Pain Loc --      Pain Education --      Exclude from Growth Chart --     Most recent vital signs: Vitals:   01/29/24 1354 01/29/24 1717  BP: (!) 119/96 139/85  Pulse: 87 81  Resp: 16 18  Temp: 99 F (37.2 C) 98.2 F (36.8 C)  SpO2: 99% 99%     General: Awake, no distress.  CV:  Good peripheral perfusion.  Regular rate and rhythm Resp:  Normal effort.  Clear to auscultation bilaterally Abd:  No distention.  Other:  Equal pulses in the upper extremities   ED Results / Procedures / Treatments   Labs (all labs ordered are listed, but only abnormal results are displayed) Labs Reviewed  CBC - Abnormal; Notable for  the following components:      Result Value   RBC 3.78 (*)    HCT 35.1 (*)    All other components within normal limits  BASIC METABOLIC PANEL WITH GFR  TROPONIN I (HIGH SENSITIVITY)     EKG  ED ECG REPORT I, Lamar Price, the attending physician, personally viewed and interpreted this ECG.  Date: 01/29/2024  Rhythm: normal sinus rhythm QRS Axis: normal Intervals: normal ST/T Wave abnormalities: normal Narrative Interpretation: no evidence of acute ischemia    RADIOLOGY Chest x-ray viewed interpret by me, no acute abnormality    PROCEDURES:  Critical Care performed:   Procedures   MEDICATIONS ORDERED IN ED: Medications  alum & mag hydroxide-simeth (MAALOX/MYLANTA) 200-200-20 MG/5ML suspension 30 mL (30 mLs Oral Given 01/29/24 1435)    And  lidocaine  (XYLOCAINE ) 2 % viscous mouth solution 15 mL (15 mLs Oral Given 01/29/24 1435)  iohexol  (OMNIPAQUE ) 350 MG/ML injection 75 mL (75 mLs Intravenous Contrast Given 01/29/24 1659)  IMPRESSION / MDM / ASSESSMENT AND PLAN / ED COURSE  I reviewed the triage vital signs and the nursing notes. Patient's presentation is most consistent with acute presentation with potential threat to life or bodily function.  Patient presents with chest discomfort as detailed above, differential includes esophagitis, less likely ACS, acute aortic syndrome  EKG is reassuring, pending high sensitive troponin.  Will obtain CT angiography of the chest to evaluate for possible dissection/aneurysm, will trial GI cocktail to see if this improves her symptoms.  CT angio without acute abnormalities, questionable esophagitis which does fit with her clinical presentation.  Will treat with Protonix , analgesics, Carafate , close follow-up with GI, strict return precautions      FINAL CLINICAL IMPRESSION(S) / ED DIAGNOSES   Final diagnoses:  Esophagitis     Rx / DC Orders   ED Discharge Orders          Ordered    pantoprazole  (PROTONIX ) 40  MG tablet  Daily        01/29/24 1746    traMADol  (ULTRAM ) 50 MG tablet  Every 6 hours PRN        01/29/24 1746    sucralfate  (CARAFATE ) 1 g tablet  3 times daily with meals & bedtime        01/29/24 1746             Note:  This document was prepared using Dragon voice recognition software and may include unintentional dictation errors.   Arlander Charleston, MD 01/29/24 [phone]

## 2024-03-20 ENCOUNTER — Emergency Department

## 2024-03-20 ENCOUNTER — Other Ambulatory Visit: Payer: Self-pay

## 2024-03-20 ENCOUNTER — Emergency Department
Admission: EM | Admit: 2024-03-20 | Discharge: 2024-03-20 | Disposition: A | Discharge status: Home / Self Care | Attending: Emergency Medicine | Admitting: Emergency Medicine

## 2024-03-20 DIAGNOSIS — R1032 Left lower quadrant pain: Secondary | ICD-10-CM | POA: Diagnosis present

## 2024-03-20 DIAGNOSIS — M654 Radial styloid tenosynovitis [de Quervain]: Secondary | ICD-10-CM | POA: Insufficient documentation

## 2024-03-20 DIAGNOSIS — R197 Diarrhea, unspecified: Secondary | ICD-10-CM | POA: Diagnosis not present

## 2024-03-20 LAB — URINALYSIS, ROUTINE W REFLEX MICROSCOPIC
Bilirubin Urine: NEGATIVE
Glucose, UA: NEGATIVE mg/dL
Hgb urine dipstick: NEGATIVE
Ketones, ur: 5 mg/dL — AB
Leukocytes,Ua: NEGATIVE
Nitrite: NEGATIVE
Protein, ur: NEGATIVE mg/dL
Specific Gravity, Urine: 1.023 (ref 1.005–1.030)
pH: 5 (ref 5.0–8.0)

## 2024-03-20 LAB — COMPREHENSIVE METABOLIC PANEL WITH GFR
ALT: 14 U/L (ref 0–44)
AST: 28 U/L (ref 15–41)
Albumin: 3.8 g/dL (ref 3.5–5.0)
Alkaline Phosphatase: 101 U/L (ref 38–126)
Anion gap: 12 (ref 5–15)
BUN: 14 mg/dL (ref 6–20)
CO2: 22 mmol/L (ref 22–32)
Calcium: 9.2 mg/dL (ref 8.9–10.3)
Chloride: 105 mmol/L (ref 98–111)
Creatinine, Ser: 0.55 mg/dL (ref 0.44–1.00)
GFR, Estimated: >60 mL/min (ref >60)
Glucose, Bld: 98 mg/dL (ref 70–99)
Potassium: 3.4 mmol/L — ABNORMAL LOW (ref 3.5–5.1)
Sodium: 139 mmol/L (ref 135–145)
Total Bilirubin: 0.5 mg/dL (ref 0.0–1.2)
Total Protein: 7.2 g/dL (ref 6.5–8.1)

## 2024-03-20 LAB — CBC
HCT: 36.6 % (ref 36.0–46.0)
Hemoglobin: 12.9 g/dL (ref 12.0–15.0)
MCH: 33.1 pg (ref 26.0–34.0)
MCHC: 35.2 g/dL (ref 30.0–36.0)
MCV: 93.8 fL (ref 80.0–100.0)
Platelets: 226 K/uL (ref 150–400)
RBC: 3.9 MIL/uL (ref 3.87–5.11)
RDW: 11.6 % (ref 11.5–15.5)
WBC: 5.7 K/uL (ref 4.0–10.5)
nRBC: 0 % (ref 0.0–0.2)

## 2024-03-20 LAB — LIPASE, BLOOD: Lipase: 38 U/L (ref 11–51)

## 2024-03-20 MED ORDER — MELOXICAM 15 MG PO TABS: 15.0000 mg | ORAL_TABLET | Freq: Every day | ORAL | 0 refills | Status: AC

## 2024-03-20 MED ORDER — METHOCARBAMOL 500 MG PO TABS
1000.0000 mg | ORAL_TABLET | Freq: Three times a day (TID) | ORAL | 0 refills | Status: AC
Start: 2024-03-20 — End: 2024-03-27

## 2024-03-20 MED ORDER — IOHEXOL 300 MG/ML  SOLN
100.0000 mL | Freq: Once | INTRAMUSCULAR | Status: AC | PRN
Start: 2024-03-20 — End: 2024-03-20
  Administered 2024-03-20: 100 mL via INTRAVENOUS

## 2024-03-20 NOTE — ED Provider Notes (Signed)
 [facility] Provider Note    Event Date/Time   First MD Initiated Contact with Patient 03/20/24 1023     (approximate)   History   Abdominal Pain   HPI  Barbara Arellano is a 48 y.o. female with PMH of uterine fibroids, ovarian cyst, GERD, anxiety and depression who presents for evaluation of LLQ pain since Friday. Patient endorses diarrhea all day Sunday, otherwise no nausea, vomiting, fevers, urinary symptoms. She does endorse some bright red blood in the stool and history of constipation prior to this visit. Patient endorses pain at her wrist as well, no specific injury, falls or trauma. 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  Surgical history included left oophorectomy, bilateral salpingectomy and laparoscopic lysis of adhesions.      Physical Exam   Triage Vital Signs: ED Triage Vitals [03/20/24 1017]  Encounter Vitals Group     BP 121/83     Girls Systolic BP Percentile      Girls Diastolic BP Percentile      Boys Systolic BP Percentile      Boys Diastolic BP Percentile      Pulse Rate 95     Resp 18     Temp 98.5 F (36.9 C)     Temp Source Oral     SpO2 98 %     Weight      Height      Head Circumference      Peak Flow      Pain Score 9     Pain Loc      Pain Education      Exclude from Growth Chart     Most recent vital signs: Vitals:   03/20/24 1017  BP: 121/83  Pulse: 95  Resp: 18  Temp: 98.5 F (36.9 C)  SpO2: 98%   General: Awake, no distress.  CV:  Good peripheral perfusion. RRR. Resp:  Normal effort. CTAB. Abd:  No distention. Soft, TTP in LLQ and lower abdomen. R Wrist: TTP at the 1st CMC joint, pain with wrist flexion, extension and ulnar deviation, positive Finklestein test   ED Results / Procedures / Treatments   Labs (all labs ordered are listed, but only abnormal results are displayed) Labs Reviewed  COMPREHENSIVE METABOLIC PANEL WITH GFR - Abnormal; Notable for the following  components:      Result Value   Potassium 3.4 (*)    All other components within normal limits  URINALYSIS, ROUTINE W REFLEX MICROSCOPIC - Abnormal; Notable for the following components:   Color, Urine YELLOW (*)    APPearance HAZY (*)    Ketones, ur 5 (*)    All other components within normal limits  LIPASE, BLOOD  CBC    RADIOLOGY  Right wrist xray obtained, I interpreted the images as well as reviewed the radiologist report which was negative for any acute abnormalities.  PROCEDURES:  Critical Care performed: No  Procedures   MEDICATIONS ORDERED IN ED: Medications  iohexol (OMNIPAQUE) 300 MG/ML solution 100 mL (100 mLs Intravenous Contrast Given 03/20/24 1240)     IMPRESSION / MDM / ASSESSMENT AND PLAN / ED COURSE  I reviewed the triage vital signs and the nursing notes.                             48  year old female presents for evaluation of left lower quadrant abdominal pain as well as right  wrist pain.  Vital signs are stable patient NAD on exam.  Differential diagnosis includes, but is not limited to, diverticulitis, bowel obstruction, colitis, constipation, hemorrhoids, UA, ureterolithiasis.  Patient's presentation is most consistent with acute complicated illness / injury requiring diagnostic workup.  Will obtain CBC, CMP, lipase and UA. Patient is quite tender in the LLQ, given her history of multiple abdominal surgeries I am most concerned about a bowel obstruction vs constipation.   Right wrist x-ray is negative for acute abnormalities.  Patient was exquisitely tender when performing the Finklestein's test.  Suspect de Quervain's tenosynovitis. Will recommend NSAIDs, bracing and orthopedic follow up.    Clinical Course as of 03/20/24 1335  Tue Mar 20, 2024  1035 CBC All within normal limits. [LD]  1107 Comprehensive metabolic panel(!) Mild hypokalemia, otherwise unremarkable. [LD]  1107 Lipase, blood Within normal limits. [LD]  1200 Urinalysis, Routine  w reflex microscopic -Urine, Clean Catch(!) Negative aside from ketones, patient reports she has not had much of an appetite. Not consistent with infection. No hemoglobin so low suspicion for kidney stone. [LD]  1227 Spoke with CT, patient does not want to take a pregnancy test and is willing to sign a waiver. [LD]  1314 CT ABDOMEN PELVIS W CONTRAST No acute intra-abdominal abnormalities. [LD]  1330 Workup is overall reassuring.  Feel that patient is stable for outpatient management.  Reviewed all results with her.  Patient did not need a note for work.  She declined pain medication while in the emergency department.  She voiced understanding, all questions were answered and she was stable at discharge. [LD]    Clinical Course User Index [LD] Cleaster Tinnie LABOR, PA-C     FINAL CLINICAL IMPRESSION(S) / ED DIAGNOSES   Final diagnoses:  LLQ abdominal pain  De Quervain's tenosynovitis     Rx / DC Orders   ED Discharge Orders          Ordered    meloxicam  (MOBIC ) 15 MG tablet  Daily        03/20/24 1332    methocarbamol  (ROBAXIN ) 500 MG tablet  3 times daily        03/20/24 1332             Note:  This document was prepared using Dragon voice recognition software and may include unintentional dictation errors.   Cleaster Tinnie LABOR, PA-C 03/20/24 1335    Dorothyann Drivers, MD 03/20/24 1445

## 2024-03-20 NOTE — ED Triage Notes (Signed)
 Pt to ED via POV from home. Pt reports LLQ pain that has been present since Friday. Denies N/V/D. Pt reports hx of cystic rupture on left ovary a few years ago. Pt also reports right wrist swelling x3-4 days.

## 2024-03-20 NOTE — Discharge Instructions (Addendum)
 Your blood work was reassuring today.  Your urinalysis did not show any signs of infection.  The x-ray of your right wrist did not show any fractures.  The CT scan of your abdomen was normal.  I believe the pain in your lower belly is due to a muscle strain, constipation or inflammation of the bowel.  Please take the meloxicam  (Mobic ) once a day for 2 weeks.  This is an anti-inflammatory.  Do not take other NSAIDs while taking this medication.  NSAIDs include ibuprofen , Motrin , Advil , naproxen , Aleve , celecoxib , and Celebrex .  I have sent a muscle relaxer to your pharmacy. This can be taken every 8 hours as needed for muscle spasms. This medication is sedating, so do not drive for 8 hours after taking it.   You can take 650 mg of Tylenol  every 6 hours as needed for pain. You can use ice, heat, muscle creams and other topical pain relievers as well.

## 2024-07-25 ENCOUNTER — Emergency Department: Payer: Self-pay

## 2024-07-25 ENCOUNTER — Emergency Department
Admission: EM | Admit: 2024-07-25 | Discharge: 2024-07-25 | Disposition: A | Discharge status: Home / Self Care | Payer: Self-pay | Attending: Emergency Medicine | Admitting: Emergency Medicine

## 2024-07-25 ENCOUNTER — Other Ambulatory Visit: Payer: Self-pay

## 2024-07-25 ENCOUNTER — Encounter: Payer: Self-pay | Admitting: *Deleted

## 2024-07-25 DIAGNOSIS — R42 Dizziness and giddiness: Secondary | ICD-10-CM | POA: Insufficient documentation

## 2024-07-25 DIAGNOSIS — M5412 Radiculopathy, cervical region: Secondary | ICD-10-CM | POA: Diagnosis not present

## 2024-07-25 DIAGNOSIS — R200 Anesthesia of skin: Secondary | ICD-10-CM | POA: Diagnosis present

## 2024-07-25 DIAGNOSIS — R531 Weakness: Secondary | ICD-10-CM

## 2024-07-25 LAB — TROPONIN T, HIGH SENSITIVITY
Troponin T High Sensitivity: <6 ng/L (ref 0–19)
Troponin T High Sensitivity: <6 ng/L (ref 0–19)

## 2024-07-25 LAB — BASIC METABOLIC PANEL WITH GFR
Anion gap: 11 (ref 5–15)
BUN: 13 mg/dL (ref 6–20)
CO2: 27 mmol/L (ref 22–32)
Calcium: 9.8 mg/dL (ref 8.9–10.3)
Chloride: 102 mmol/L (ref 98–111)
Creatinine, Ser: 0.59 mg/dL (ref 0.44–1.00)
GFR, Estimated: >60 mL/min
Glucose, Bld: 138 mg/dL — ABNORMAL HIGH (ref 70–99)
Potassium: 3.3 mmol/L — ABNORMAL LOW (ref 3.5–5.1)
Sodium: 141 mmol/L (ref 135–145)

## 2024-07-25 LAB — CBC
HCT: 36 % (ref 36.0–46.0)
Hemoglobin: 12.2 g/dL (ref 12.0–15.0)
MCH: 32.2 pg (ref 26.0–34.0)
MCHC: 33.9 g/dL (ref 30.0–36.0)
MCV: 95 fL (ref 80.0–100.0)
Platelets: 235 K/uL (ref 150–400)
RBC: 3.79 MIL/uL — ABNORMAL LOW (ref 3.87–5.11)
RDW: 11.9 % (ref 11.5–15.5)
WBC: 6.9 K/uL (ref 4.0–10.5)
nRBC: 0 % (ref 0.0–0.2)

## 2024-07-25 MED ORDER — PREDNISONE 10 MG (21) PO TBPK: ORAL_TABLET | ORAL | 0 refills | Status: AC

## 2024-07-25 MED ORDER — GABAPENTIN 300 MG PO CAPS: 300.0000 mg | ORAL_CAPSULE | Freq: Three times a day (TID) | ORAL | 2 refills | Status: AC | PRN

## 2024-07-25 NOTE — ED Triage Notes (Signed)
 Pt to triage via wheelchair.  Pt has left arm pain and numbness.  Pt reports chest pain.  Pt states she feels like she is having tremors. Sx began yesterday morning.    Pt alert  speech clear.

## 2024-07-25 NOTE — ED Provider Notes (Signed)
 "  [facility] Provider Note    Event Date/Time   First MD Initiated Contact with Patient 07/25/24 1641     (approximate)   History   arm numbness, Chest Pain, and Tremors   HPI  Barbara Arellano is a 49 y.o. female who presents to the emergency department today with primary concern for left arm numbness and neck pain.  The symptoms started yesterday in the early morning.  She states that the pain has been constant.  Located in the neck and her left trapezius.  She at one point did have pain going down her arm although at the time my exam states that the pain in her arm is improved although she has had continued numbness since the pain began.  The patient denies any trauma or unusual activity before the symptoms.  In addition the patient states that she has been having some dizziness and lightheadedness.  She states that she did fall at work the other day because of her symptoms.   Physical Exam   Triage Vital Signs: ED Triage Vitals  Encounter Vitals Group     BP 07/25/24 1516 135/84     Girls Systolic BP Percentile --      Girls Diastolic BP Percentile --      Boys Systolic BP Percentile --      Boys Diastolic BP Percentile --      Pulse Rate 07/25/24 1516 96     Resp 07/25/24 1516 18     Temp 07/25/24 1516 98.3 F (36.8 C)     Temp Source 07/25/24 1516 Oral     SpO2 07/25/24 1516 98 %     Weight 07/25/24 1512 162 lb (73.5 kg)     Height 07/25/24 1512 5' 4 (1.626 m)     Head Circumference --      Peak Flow --      Pain Score 07/25/24 1512 8     Pain Loc --      Pain Education --      Exclude from Growth Chart --     Most recent vital signs: Vitals:   07/25/24 1516  BP: 135/84  Pulse: 96  Resp: 18  Temp: 98.3 F (36.8 C)  SpO2: 98%   General: Awake, alert, oriented. CV:  Good peripheral perfusion. Regular rate and rhythm. Resp:  Normal effort. Lungs clear. Abd:  No distention.   ED Results / Procedures / Treatments   Labs (all  labs ordered are listed, but only abnormal results are displayed) Labs Reviewed  BASIC METABOLIC PANEL WITH GFR - Abnormal; Notable for the following components:      Result Value   Potassium 3.3 (*)    Glucose, Bld 138 (*)    All other components within normal limits  CBC - Abnormal; Notable for the following components:   RBC 3.79 (*)    All other components within normal limits  TROPONIN T, HIGH SENSITIVITY  TROPONIN T, HIGH SENSITIVITY     EKG  I, Guadalupe Eagles, attending physician, personally viewed and interpreted this EKG  EKG Time: 1514 Rate: 85 Rhythm: normal sinus rhythm Axis: normal Intervals: qtc 452 QRS: narrow, q waves V1, V2 ST changes: no st elevation Impression: abnormal ekg    RADIOLOGY I independently interpreted and visualized the CXR. My interpretation: No pneumonia Radiology interpretation:  IMPRESSION:  No active cardiopulmonary disease.   I independently interpreted and visualized the MR cervical spine. My interpretation: No spinal cord compression Radiology  interpretation:  IMPRESSION:  1. Mild foraminal stenosis bilaterally at C6-C7, on the right at C5-C6, and on  the left at C7-T1.  2. C5-C6 degenerative disc disease.  3. No spinal canal stenosis.   I independently interpreted and visualized the MR brain. My interpretation: No large mass Radiology interpretation:  IMPRESSION:  1. No acute abnormality.      PROCEDURES:  Critical Care performed: No    MEDICATIONS ORDERED IN ED: Medications - No data to display   IMPRESSION / MDM / ASSESSMENT AND PLAN / ED COURSE  I reviewed the triage vital signs and the nursing notes.                              Differential diagnosis includes, but is not limited to, CVA, cervical radiculopathy, spinal cord compression, peripheral nerve disorder.  Patient's presentation is most consistent with acute presentation with potential threat to life or bodily function.  Patient presented to  the emergency department today with primary concerns for left arm pain and numbness.  On exam patient's good peripheral perfusion.  Did obtain MRI of the brain and cervical spine.  Does show some foraminal narrowing however no CVA or spinal cord compression.  This time I do think cervical radiculopathy likely.  I discussed this with the patient.  Will discharge with steroids and gabapentin .  In addition patient has been feeling weak recently.  Blood work here without any significant anemia, electrolyte abnormalities.  No signs of concerning dehydration.  At this time will plan on discharging.      FINAL CLINICAL IMPRESSION(S) / ED DIAGNOSES   Final diagnoses:  Cervical radiculopathy  Weakness     Note:  This document was prepared using Dragon voice recognition software and may include unintentional dictation errors.    Floy Roberts, MD 07/25/24 2154  "
